# Patient Record
Sex: Female | Born: 1995
Health system: Southern US, Community
[De-identification: ages and names within clinical notes are randomized; demographics above are authoritative.]

## PROBLEM LIST (undated history)

## (undated) DIAGNOSIS — Z1379 Encounter for other screening for genetic and chromosomal anomalies: Principal | ICD-10-CM

## (undated) DIAGNOSIS — C50919 Malignant neoplasm of unspecified site of unspecified female breast: Secondary | ICD-10-CM

## (undated) DIAGNOSIS — Z923 Personal history of irradiation: Secondary | ICD-10-CM

## (undated) DIAGNOSIS — Z809 Family history of malignant neoplasm, unspecified: Secondary | ICD-10-CM

## (undated) HISTORY — DX: Family history of malignant neoplasm, unspecified: Z80.9

## (undated) HISTORY — DX: Encounter for other screening for genetic and chromosomal anomalies: Z13.79

## (undated) HISTORY — DX: Malignant neoplasm of unspecified site of unspecified female breast: C50.919

---

## 2017-03-31 ENCOUNTER — Ambulatory Visit (HOSPITAL_COMMUNITY)
Admission: EM | Admit: 2017-03-31 | Discharge: 2017-03-31 | Disposition: A | Discharge status: Home / Self Care | Payer: Medicaid Other | Attending: Family Medicine | Admitting: Family Medicine

## 2017-03-31 ENCOUNTER — Encounter (HOSPITAL_COMMUNITY): Payer: Self-pay | Admitting: Emergency Medicine

## 2017-03-31 DIAGNOSIS — N6323 Unspecified lump in the left breast, lower outer quadrant: Secondary | ICD-10-CM

## 2017-03-31 DIAGNOSIS — N63 Unspecified lump in unspecified breast: Secondary | ICD-10-CM

## 2017-03-31 MED ORDER — IBUPROFEN 200 MG PO TABS: 400.0000 mg | ORAL_TABLET | Freq: Four times a day (QID) | ORAL | 0 refills | Status: DC | PRN

## 2017-03-31 NOTE — Discharge Instructions (Signed)
You should be called by the Breast Center to set up time for imaging of this lump.  May take ibuprofen as needed for headache or arm pain.  If you do not hear from them by 12/17 please call this clinic or the breast center to set this up.  Please establish with a primary care provider for further follow up as needed.

## 2017-03-31 NOTE — ED Provider Notes (Signed)
[facility]    CSN: [id number] Arrival date & time: 03/31/17  1427     History   Chief Complaint Chief Complaint  Patient presents with  . Abscess    HPI Carrie Miller is a 21 y.o. female.   [name] presents with friend and family with complaints of left breast mass which she noticed approximately 2-3 weeks ago and seems to be increasing in pain. It is tender. She at times feels pain to her left arm as well, keeps her up at night at times. She has had intermittent headaches as well. Has taken ibuprofen which has helped. Has not taken today. Pain is 10/10 with palpation. Without redness or drainage. Denies previous similar. Without breast or nipple discharge. Without fevers. Denies medical history, does not take any medications regularly.   ROS per HPI.       History reviewed. No pertinent past medical history.  There are no active problems to display for this patient.   History reviewed. No pertinent surgical history.  OB History    No data available       Home Medications    Prior to Admission medications   Medication Sig Start Date End Date Taking? Authorizing Provider  ibuprofen (ADVIL,MOTRIN) 200 MG tablet Take 2 tablets (400 mg total) by mouth every 6 (six) hours as needed. 03/31/17   Zigmund Gottron, NP    Family History No family history on file.  Social History Social History   Tobacco Use  . Smoking status: Not on file  Substance Use Topics  . Alcohol use: Not on file  . Drug use: Not on file     Allergies   Patient has no known allergies.   Review of Systems Review of Systems   Physical Exam Triage Vital Signs ED Triage Vitals  Enc Vitals Group     BP 03/31/17 1526 120/81     Pulse Rate 03/31/17 1526 71     Resp 03/31/17 1526 16     Temp 03/31/17 1526 98.2 F (36.8 C)     Temp Source 03/31/17 1526 Oral     SpO2 03/31/17 1526 100 %     Weight 03/31/17 1527 160 lb (72.6 kg)     Height --      Head  Circumference --      Peak Flow --      Pain Score --      Pain Loc --      Pain Edu? --      Excl. in [place]? --    No data found.  Updated Vital Signs BP 120/81   Pulse 71   Temp 98.2 F (36.8 C) (Oral)   Resp 16   Wt 160 lb (72.6 kg)   SpO2 100%   Visual Acuity Right Eye Distance:   Left Eye Distance:   Bilateral Distance:    Right Eye Near:   Left Eye Near:    Bilateral Near:     Physical Exam  Constitutional: She is oriented to person, place, and time. She appears well-developed and well-nourished. No distress.  Cardiovascular: Normal rate, regular rhythm and normal heart sounds.  Pulmonary/Chest: Effort normal and breath sounds normal. Left breast exhibits mass and tenderness. Left breast exhibits no nipple discharge and no skin change.  Firm mass to left breast as indicated to graphic, approximately 1.5 inch in diameter; without redness, skin changes or apparent abscess present    Musculoskeletal: Normal range of motion.  Neurological: She is  alert and oriented to person, place, and time. No cranial nerve deficit.  Skin: Skin is warm and dry.  Vitals reviewed.    UC Treatments / Results  Labs (all labs ordered are listed, but only abnormal results are displayed) Labs Reviewed - No data to display  EKG  EKG Interpretation None       Radiology No results found.  Procedures Procedures (including critical care time)  Medications Ordered in UC Medications - No data to display   Initial Impression / Assessment and Plan / UC Course  I have reviewed the triage vital signs and the nursing notes.  Pertinent labs & imaging results that were available during my care of the patient were reviewed by me and considered in my medical decision making (see chart for details).     Order written for diagnostic mammography with breast center for further evaluation and treatment. May continue to use ibuprofen as needed for headaches or arm pain. Patient and family  verbalized understanding and agreeable to plan.    Final Clinical Impressions(s) / UC Diagnoses   Final diagnoses:  Breast mass    ED Discharge Orders        Ordered    ibuprofen (ADVIL,MOTRIN) 200 MG tablet  Every 6 hours PRN     03/31/17 1625       Controlled Substance Prescriptions [place] Controlled Substance Registry consulted? Not Applicable   Zigmund Gottron, NP 03/31/17 1636    Zigmund Gottron, NP 03/31/17 [phone]

## 2017-03-31 NOTE — ED Triage Notes (Signed)
PT has a bump / abscess under left breast for 4-5 days. PT has been having menstruals 2 times per month.

## 2017-04-03 ENCOUNTER — Other Ambulatory Visit: Payer: Self-pay | Admitting: Emergency Medicine

## 2017-04-03 DIAGNOSIS — N6320 Unspecified lump in the left breast, unspecified quadrant: Secondary | ICD-10-CM

## 2017-04-03 DIAGNOSIS — N644 Mastodynia: Secondary | ICD-10-CM

## 2017-04-12 ENCOUNTER — Other Ambulatory Visit: Payer: Self-pay

## 2017-04-15 ENCOUNTER — Ambulatory Visit
Admission: RE | Admit: 2017-04-15 | Discharge: 2017-04-15 | Disposition: A | Discharge status: Home / Self Care | Payer: No Typology Code available for payment source | Source: Ambulatory Visit | Attending: Obstetrics and Gynecology | Admitting: Obstetrics and Gynecology

## 2017-04-15 ENCOUNTER — Other Ambulatory Visit: Payer: Self-pay | Admitting: Obstetrics and Gynecology

## 2017-04-15 ENCOUNTER — Ambulatory Visit
Admission: RE | Admit: 2017-04-15 | Discharge: 2017-04-15 | Disposition: A | Discharge status: Home / Self Care | Payer: No Typology Code available for payment source | Source: Ambulatory Visit | Attending: Emergency Medicine | Admitting: Emergency Medicine

## 2017-04-15 ENCOUNTER — Other Ambulatory Visit: Payer: Self-pay | Admitting: Emergency Medicine

## 2017-04-15 ENCOUNTER — Encounter (HOSPITAL_COMMUNITY): Payer: Self-pay

## 2017-04-15 ENCOUNTER — Ambulatory Visit (HOSPITAL_COMMUNITY)
Admission: RE | Admit: 2017-04-15 | Discharge: 2017-04-15 | Disposition: A | Discharge status: Home / Self Care | Payer: Self-pay | Source: Ambulatory Visit | Attending: Obstetrics and Gynecology | Admitting: Obstetrics and Gynecology

## 2017-04-15 VITALS — BP 128/80 | Temp 97.8°F | Wt 201.8 lb

## 2017-04-15 DIAGNOSIS — N644 Mastodynia: Secondary | ICD-10-CM

## 2017-04-15 DIAGNOSIS — N6323 Unspecified lump in the left breast, lower outer quadrant: Secondary | ICD-10-CM

## 2017-04-15 DIAGNOSIS — N6320 Unspecified lump in the left breast, unspecified quadrant: Secondary | ICD-10-CM

## 2017-04-15 DIAGNOSIS — R599 Enlarged lymph nodes, unspecified: Secondary | ICD-10-CM

## 2017-04-15 DIAGNOSIS — Z01419 Encounter for gynecological examination (general) (routine) without abnormal findings: Secondary | ICD-10-CM

## 2017-04-15 DIAGNOSIS — N6321 Unspecified lump in the left breast, upper outer quadrant: Secondary | ICD-10-CM

## 2017-04-15 NOTE — Patient Instructions (Signed)
Explained breast self awareness with [facility]. Let patient know [facility] will cover Pap smears every 3 years unless has a history of abnormal Pap smears. Referred patient to the [facility] for a diagnostic mammogram. Appointment scheduled for Thursday, April 15, 2017 following [facility] appointment. Let patient know will follow up with her within the next couple weeks with results of Pap smear by phone. Carrie Miller verbalized understanding.  Carrie Miller, Arvil Chaco, RN 8:48 AM

## 2017-04-15 NOTE — Progress Notes (Signed)
Complaints of left breast lump x 4-5 weeks that has increased in size. Patient complained of left breast pain that comes goes and goes. Patient rates the pain at a 8 out of 10.  Pap Smear: Pap smear completed today. Per patient has never had a Pap smear completed. No Pap smear results are in Epic.  Physical exam: BreastsP Breasts symmetrical. No skin abnormalities bilateral breasts. No nipple retraction bilateral breasts. No nipple discharge bilateral breasts. No lymphadenopathy. No lumps palpated right breast. Palpated a left breast mass within the left lower outer quadrant and a left breast lump at 2 o'clock 11 cm from the nipple. Complaints of tenderness when palpated left breast mass. Referred patient to the [facility] for a diagnostic mammogram. Appointment scheduled for Thursday, April 15, 2017 following [facility] appointment.  Pelvic/Bimanual   Ext Genitalia No lesions, no swelling and no discharge observed on external genitalia.         Vagina Vagina pink and normal texture. No lesions or discharge observed in vagina.          Cervix Cervix is present. Cervix pink and of normal texture. No discharge observed.     Uterus Uterus is present and palpable. Uterus in normal position and normal size.        Adnexae Bilateral ovaries present and palpable. No tenderness on palpation.          Rectovaginal No rectal exam completed today since patient had no rectal complaints. No skin abnormalities observed on exam.    Smoking History: Patient has never smoked.  Patient Navigation: Patient education provided. Access to services provided for patient through [facility] program.

## 2017-04-16 ENCOUNTER — Encounter (HOSPITAL_COMMUNITY): Payer: Self-pay | Admitting: *Deleted

## 2017-04-16 ENCOUNTER — Telehealth (HOSPITAL_COMMUNITY): Payer: Self-pay | Admitting: Internal Medicine

## 2017-04-16 NOTE — Telephone Encounter (Signed)
L breast lesion(s) highly likely to be malignant on mammogram, scheduled for US/bx 12/31.  Referring to Oncology/Dr Lindi Adie.  LM

## 2017-04-19 ENCOUNTER — Ambulatory Visit
Admission: RE | Admit: 2017-04-19 | Discharge: 2017-04-19 | Disposition: A | Discharge status: Home / Self Care | Payer: No Typology Code available for payment source | Source: Ambulatory Visit | Attending: Obstetrics and Gynecology | Admitting: Obstetrics and Gynecology

## 2017-04-19 ENCOUNTER — Other Ambulatory Visit: Payer: Self-pay | Admitting: Obstetrics and Gynecology

## 2017-04-19 DIAGNOSIS — N644 Mastodynia: Secondary | ICD-10-CM

## 2017-04-19 DIAGNOSIS — R599 Enlarged lymph nodes, unspecified: Secondary | ICD-10-CM

## 2017-04-19 DIAGNOSIS — N6320 Unspecified lump in the left breast, unspecified quadrant: Secondary | ICD-10-CM

## 2017-04-20 LAB — CYTOLOGY - PAP: HPV: NOT DETECTED

## 2017-04-21 ENCOUNTER — Ambulatory Visit
Admission: RE | Admit: 2017-04-21 | Discharge: 2017-04-21 | Disposition: A | Discharge status: Home / Self Care | Payer: No Typology Code available for payment source | Source: Ambulatory Visit | Attending: Obstetrics and Gynecology | Admitting: Obstetrics and Gynecology

## 2017-04-21 ENCOUNTER — Telehealth: Payer: Self-pay | Admitting: Hematology

## 2017-04-21 DIAGNOSIS — N644 Mastodynia: Secondary | ICD-10-CM

## 2017-04-21 DIAGNOSIS — N6320 Unspecified lump in the left breast, unspecified quadrant: Secondary | ICD-10-CM

## 2017-04-21 NOTE — Telephone Encounter (Signed)
Spoke with patients sister in regards to upcoming appointment and gave her D/T/Loc/Ph#.  She stated that she did not need interp and her brother in law would be with her at appointment

## 2017-04-22 NOTE — Progress Notes (Signed)
Sent request to the clinics to schedule colpo so I can call patient with results and appointment. Thanks, Gabriel Cirri

## 2017-04-23 ENCOUNTER — Telehealth (HOSPITAL_COMMUNITY): Payer: Self-pay | Admitting: *Deleted

## 2017-04-23 NOTE — Telephone Encounter (Signed)
Telephoned patient at home left message to return call to [facility].

## 2017-04-26 ENCOUNTER — Telehealth (HOSPITAL_COMMUNITY): Payer: Self-pay | Admitting: *Deleted

## 2017-04-26 NOTE — Telephone Encounter (Signed)
Telephoned patient at home number and left message to return call to [facility] 

## 2017-04-27 ENCOUNTER — Other Ambulatory Visit: Payer: Self-pay | Admitting: General Surgery

## 2017-04-27 DIAGNOSIS — C50919 Malignant neoplasm of unspecified site of unspecified female breast: Secondary | ICD-10-CM

## 2017-04-27 DIAGNOSIS — C773 Secondary and unspecified malignant neoplasm of axilla and upper limb lymph nodes: Principal | ICD-10-CM

## 2017-04-28 ENCOUNTER — Encounter: Payer: Self-pay | Admitting: Nurse Practitioner

## 2017-04-28 ENCOUNTER — Other Ambulatory Visit: Payer: Self-pay | Admitting: *Deleted

## 2017-04-28 ENCOUNTER — Encounter: Payer: Self-pay | Admitting: General Practice

## 2017-04-28 ENCOUNTER — Telehealth: Payer: Self-pay | Admitting: Nurse Practitioner

## 2017-04-28 ENCOUNTER — Inpatient Hospital Stay: Payer: Medicaid Other | Attending: Nurse Practitioner | Admitting: Nurse Practitioner

## 2017-04-28 VITALS — BP 129/73 | HR 68 | Temp 97.9°F | Resp 18 | Wt 203.5 lb

## 2017-04-28 DIAGNOSIS — D493 Neoplasm of unspecified behavior of breast: Secondary | ICD-10-CM

## 2017-04-28 DIAGNOSIS — F419 Anxiety disorder, unspecified: Secondary | ICD-10-CM

## 2017-04-28 DIAGNOSIS — K769 Liver disease, unspecified: Secondary | ICD-10-CM | POA: Insufficient documentation

## 2017-04-28 DIAGNOSIS — Z171 Estrogen receptor negative status [ER-]: Secondary | ICD-10-CM | POA: Insufficient documentation

## 2017-04-28 DIAGNOSIS — R42 Dizziness and giddiness: Secondary | ICD-10-CM | POA: Insufficient documentation

## 2017-04-28 DIAGNOSIS — C773 Secondary and unspecified malignant neoplasm of axilla and upper limb lymph nodes: Secondary | ICD-10-CM | POA: Diagnosis not present

## 2017-04-28 DIAGNOSIS — Z8042 Family history of malignant neoplasm of prostate: Secondary | ICD-10-CM | POA: Insufficient documentation

## 2017-04-28 DIAGNOSIS — G4452 New daily persistent headache (NDPH): Secondary | ICD-10-CM | POA: Diagnosis not present

## 2017-04-28 DIAGNOSIS — Z5189 Encounter for other specified aftercare: Secondary | ICD-10-CM | POA: Insufficient documentation

## 2017-04-28 DIAGNOSIS — C50812 Malignant neoplasm of overlapping sites of left female breast: Secondary | ICD-10-CM | POA: Insufficient documentation

## 2017-04-28 DIAGNOSIS — Z808 Family history of malignant neoplasm of other organs or systems: Secondary | ICD-10-CM | POA: Insufficient documentation

## 2017-04-28 DIAGNOSIS — Z5111 Encounter for antineoplastic chemotherapy: Secondary | ICD-10-CM | POA: Insufficient documentation

## 2017-04-28 DIAGNOSIS — N939 Abnormal uterine and vaginal bleeding, unspecified: Secondary | ICD-10-CM | POA: Diagnosis not present

## 2017-04-28 MED ORDER — ALPRAZOLAM 0.25 MG PO TABS: 0.2500 mg | ORAL_TABLET | Freq: Every evening | ORAL | 0 refills | Status: DC | PRN

## 2017-04-28 NOTE — Telephone Encounter (Signed)
Gave avs and calendar for January and february °

## 2017-04-28 NOTE — Progress Notes (Signed)
Met with uninsured patient and family regarding assistance.  Discussed the [employer] and qualifications, patient has no income and lives at home. Letter of support requested and family verbalized understanding. Patient also going upstairs for [facility] Medicaid appointment. Advised them that she would automatically receive a 55%discount for being uninsured if Medicaid were not approved. Advised also that Rob in pharmacy will meet with them regarding any chemo drugs that are available for free through drug replacement. Will send Rob an email regarding this.  Scheduled appointment for 05/12/17 at 9am w/me. Appointment made in [place].

## 2017-04-28 NOTE — Progress Notes (Signed)
[facility] Clinical Social Work  Clinical Social Work was referred by Marine scientist.  Clinical Social Worker Edwyna Shell met w patient in office to assess psychosocial, emotional, mental health, and spiritual needs of the patient.  Patient's knowledge about cancer and its treatment including level of understanding, reactions, goals for care, and expectations:  New diagnosis of breast cancer, confirmed today. Prior to today, pt knew this was "possibiilty"; however, is now aware of reality of diagnosis.  Concerned about loss of hair, changes in skin, loss of fertility, side effects of chemotherapy.    Characteristics of the patient's support system:   Immigrated to  [place] from [place] w parents approx 9 months ago.  Lives w brother and sister in law.  Limited community support outside of family.  Friends in [place].  Parents are very "nervous" about patient's diagnosis, patient tries to shield them from becoming overwhelmed  Patient and family psychosocial functioning including strengths, limitations, and coping skills:  Supportive family, parents are not fully informed about nature of disease and treatment protocol, patient and brother/sister in law are worried that parents will not understand and will become very anxious on patient's behalf.  Patient often does not share her internal feelings w others, holds things inside.    Identifications of barriers to care:  Uninsured, is applying for [facility] as well as various financial grants available, has met w [employer] to complete initial paperwork.    Availability of community resources:  Attends house of worship locally.  Otherwise, has little outside support.  Was attending classes at community college w hopes of entering college and eventually studying to become a physician.  "I feel like my dreams are finished today."  Family will provide transport.  Lives w brother, sister in law and parents.    Clinical  Social Worker follow up needed: Yes.    If yes, follow up plan: CSW will follow patient while in treatment.  Patient and family given packet of information as well as CSW contact information.  Encouraged to call as needed.  Patient will need emotional support as well as available financial support resource information.    Edwyna Shell, LCSW Clinical Social Worker Phone:  [phone]

## 2017-04-28 NOTE — Progress Notes (Addendum)
[place]  [phone] Fax:([phone]) [place] Note   Patient Care Team: Patient, No Pcp Per as PCP - General ([place]) [name], MD as Consulting Physician (General Surgery) [name], MD as Consulting Physician (Hematology) [name], NP as Nurse Practitioner (Nurse Practitioner) 04/28/2017  CHIEF COMPLAINTS/PURPOSE OF CONSULTATION:  Left breast cancer, ER/PR Negative, HER2 Negative  ONCOLOGY HISTORY   Cancer of overlapping sites of left breast ([place])   04/15/2017 Mammogram    IMPRESSION: 1. Highly suspicious irregular mass within the left breast at the 4 o'clock axis, 8 cm from the nipple, measuring 5.2 cm, corresponding to the area of clinical concern. Ultrasound-guided biopsy is recommended. 2. Additional suspicious irregular mass within the left breast at the 2 o'clock axis, 8 cm from the nipple, measuring 1.6 cm, corresponding to the mammographic finding. Ultrasound-guided biopsy is recommended. 3. Additional suspicious mass within the left breast at the 2 o'clock axis, 10 cm from the nipple, axillary tail region, measuring 3 cm, suspected lymph node completely replaced by tumor. Ultrasound-guided biopsy is recommended. 4. No evidence of malignancy within the right breast.       04/15/2017 Breast US    Left breast: Targeted ultrasound is performed, showing an irregular mass in the left breast at the 4 o'clock axis, 8 cm from the nipple, measuring 5.2 x 4.3 x 4.3 cm, with internal vascularity, corresponding to the mammographic finding and palpable lump.  There is an additional irregular hypoechoic mass in the left breast at the 2 o'clock axis, 8 cm from the nipple, measuring 1.6 x 1.3 x 1.3 cm, with internal vascularity, corresponding to the additional mass seen within the outer left breast on mammogram, suspected satellite mass.  Lastly, there is an oval circumscribed hypoechoic mass in the  left breast at the 2 o'clock axis, 10 cm from the nipple, axillary tail region, with heterogeneous echotexture, measuring 3 x 2.3 x 2.8 cm, suspected lymph node completely replaced by tumor.  Remainder of the left axilla was evaluated with ultrasound showing no additional enlarged or morphologically abnormal lymph nodes.  IMPRESSION: 1. Highly suspicious irregular mass within the left breast at the 4 o'clock axis, 8 cm from the nipple, measuring 5.2 cm, corresponding to the area of clinical concern. Ultrasound-guided biopsy is recommended. 2. Additional suspicious irregular mass within the left breast at the 2 o'clock axis, 8 cm from the nipple, measuring 1.6 cm, corresponding to the mammographic finding. Ultrasound-guided biopsy is recommended. 3. Additional suspicious mass within the left breast at the 2 o'clock axis, 10 cm from the nipple, axillary tail region, measuring 3 cm, suspected lymph node completely replaced by tumor. Ultrasound-guided biopsy is recommended. 4. No evidence of malignancy within the right breast.       04/19/2017 Initial Biopsy    Diagnosis 1. Breast, left, needle core biopsy, 4:00 o'clock, ribbon clip - INVASIVE DUCTAL CARCINOMA. - SEE COMMENT. 2. Breast, left, needle core biopsy, 2:00 o'clock, coil clip - INVASIVE DUCTAL CARCINOMA. - SEE COMMENT. 3. Lymph node, needle/core biopsy, left axilla, spiral hydromark - DUCTAL CARCINOMA. - SEE COMMENT.  2. PROGNOSTIC INDICATORS Results: IMMUNOHISTOCHEMICAL AND MORPHOMETRIC ANALYSIS PERFORMED MANUALLY Estrogen Receptor: 0%, NEGATIVE Progesterone Receptor: 0%, NEGATIVE Proliferation Marker Ki67: 80%  2. FLUORESCENCE IN-SITU HYBRIDIZATION Results: HER2 - NEGATIVE RATIO OF HER2/CEP17 SIGNALS 1.53 AVERAGE HER2 COPY NUMBER PER CELL 2.75  Microscopic Comment 1. The carcinoma in the three specimens is morphologically similar and is grade III. Lymph nodal tissue is not definitively  identified in  specimen #3. A breast prognostic profile will be performed on part 2 and the results reported separately.      04/28/2017 Initial Diagnosis    Cancer of overlapping sites of left breast ([place])      HISTORY OF PRESENTING ILLNESS:  Carrie Miller 22 y.o. female is here because of newly diagnosed left breast cancer. She was referred by Dr. Barry Dienes. She presents with her brother and his wife; she speaks minimal [place], her family interpreted for her as she refused interpreter services. She initially palpated a left breast mass 6 weeks ago that was painful with breast swelling and felt to be enlarging. Has not had prior mammogram. Denies nipple inversion, discharge, or skin dimpling. She went to [facility] urgent care and was then referred to the breast center for imaging and diagnostics. Diagnostic mammogram showed a dominant mass in the left breast at the 4 o'clock axis, 8 cm from the nipple, measuring 5.2 x 4.3 x 4.3 cm; a satellite mass in the left breast at the 2 o'clock axis, 8 cm from the nipple, measuring 1.6 x 1.3 x 1.3 cm; and a circumscribed hypoechoic mass in the left breast at the 2 o'clock axis, 10 cm from the nipple, axillary tail measuring 3 x 2.3 x 2.8 cm, suspected lymph node completely replaced by tumor.  Ultrasound-guided biopsy of all 3 masses were positive for invasive ductal carcinoma, ER/PR negative, HER-2 negative, grade 3.    In addition she reports 1 month history of frequent daily headaches with associated dizziness and occasional left eye pain. Pain often wakes her up from sleep, has tried ibuprofen with some relief. Pain usually at the crown of her head, average 5/10 - 9/10 on pain scale. Not sensitive to light or sound. Denies fall. Reports fatigue for 1 week, denies weight loss, decreased appetite, abdominal pain. Over last 2 months she has irregular vaginal bleeding, bleeding 20 days out of the last month. Recently had abnormal PAP smear, colposcopy is pending. She feels very  anxious about her diagnosis.  She has no significant past medical history. Her father has prostate cancer diagnosed at age 46. Paternal aunt had "abdominal cancer." a paternal uncle also had prostate cancer diagnosed age 15. Negative family history of breast or GYN cancer. She lives with her family in a home with her parents, brother, and his brother's wife. She does not work or drive. She lived in [place] and moved to [place] 9 months ago. She has permanent resident card, no health insurance. Has been getting assistance with BCCCP program through [employer].  GYN HISTORY  Menarchal: 7 LMP: currently menstruating  Contraceptive: none HRT: none GP: G0  MEDICAL HISTORY:  Past Medical History:  Diagnosis Date  . Breast cancer ([place])     SURGICAL HISTORY: No past surgical history on file.  SOCIAL HISTORY: Social History   Socioeconomic History  . Marital status: Single    Spouse name: Not on file  . Number of children: Not on file  . Years of education: Not on file  . Highest education level: Not on file  Social Needs  . Financial resource strain: Not on file  . Food insecurity - worry: Not on file  . Food insecurity - inability: Not on file  . Transportation needs - medical: Not on file  . Transportation needs - non-medical: Not on file  Occupational History  . Occupation: not working   Tobacco Use  . Smoking status: Never Smoker  . Smokeless tobacco:  Never Used  Substance and Sexual Activity  . Alcohol use: No    Frequency: Never  . Drug use: No  . Sexual activity: No    Birth control/protection: None  Other Topics Concern  . Not on file  Social History Narrative  . Not on file    FAMILY HISTORY: Family History  Problem Relation Age of Onset  . Hyperlipidemia Mother   . Hypertension Mother   . Cancer Father 34       prostate  . Cancer Paternal Aunt 72       abdominal, type unknown   . Cancer Paternal Uncle 31       prostate    ALLERGIES:  has No  Known Allergies.  MEDICATIONS:  Current Outpatient Medications  Medication Sig Dispense Refill  . ALPRAZolam (XANAX) 0.25 MG tablet Take 1 tablet (0.25 mg total) by mouth at bedtime as needed for anxiety. 30 tablet 0  . ibuprofen (ADVIL,MOTRIN) 200 MG tablet Take 2 tablets (400 mg total) by mouth every 6 (six) hours as needed. 30 tablet 0   No current facility-administered medications for this visit.     REVIEW OF SYSTEMS:   Constitutional: Denies fevers, chills, abnormal night sweats, or decreased appetite (+) fatigue Eyes: Denies blurriness of vision, double vision or watery eyes (+) occasional left eye pain with daily headache Ears, nose, mouth, throat, and face: Denies mucositis or sore throat Respiratory: Denies cough, dyspnea or wheezes Cardiovascular: Denies palpitation, chest discomfort or lower extremity swelling Gastrointestinal:  Denies nausea, vomiting, constipation, diarrhea, heartburn or change in bowel habits GU/GYN: (+) Irregular vaginal bleeding, currently menstruating; (+) irregular bleeding every 5 days (+) has had vaginal bleeding 20 days out of last month (+) atypical cells on PAP 12/27, pending colposcopy  Skin: Denies abnormal skin rashes Lymphatics: Easy bruising (+) enlarged left axillary lymph node Neurological:Denies numbness, tingling or new weaknesses (+) daily headaches with associated dizziness Behavioral/Psych: (+) Anxiety (+) tearful All other systems were reviewed with the patient and are negative.  PHYSICAL EXAMINATION: ECOG PERFORMANCE STATUS: 1 - Symptomatic but completely ambulatory  Vitals:   04/28/17 1304  BP: 129/73  Pulse: 68  Resp: 18  Temp: 97.9 F (36.6 C)  SpO2: 100%   Filed Weights   04/28/17 1304  Weight: 203 lb 8 oz (92.3 kg)    GENERAL:alert, no distress and comfortable SKIN: skin color, texture, turgor are normal, no rashes or significant lesions EYES: normal, conjunctiva are pink and non-injected, sclera  clear OROPHARYNX:no exudate, no erythema and lips, buccal mucosa, and tongue normal  NECK: supple, thyroid normal size, non-tender, without nodularity LYMPH:  no palpable cervical, supraclavicular, or inguinal lymphadenopathy (+) left axillary adenopathy  LUNGS: clear to auscultation bilaterally with normal breathing effort HEART: regular rate & rhythm and no murmurs and no lower extremity edema ABDOMEN:abdomen soft, non-tender and normal bowel sounds.  No palpable hepatomegaly Musculoskeletal:no cyanosis of digits and no clubbing  PSYCH: alert & oriented x 3 with fluent speech NEURO: no focal motor/sensory deficits.  Cranial nerves II through XII intact BREASTS: No palpable mass or adenopathy in right breast or axilla that I could appreciate. (+) Palpable 7 x 3 cm mass in the lower outer quadrant of the left breast, tender (+) satellite mass in upper outer quadrant that is approximately 2.5 cm (+) palpable left lower axillary lymph node approximately 3 cm. The breast is swollen; measurements may be larger than corresponding imaging due to bleeding from biopsies.  LABORATORY DATA:  I have  reviewed the data as listed No flowsheet data found. Outside records with lab draw 04/26/17 and will be scanned into chart: WBC 6.4 ANC 3.7 RBC 4.34 Hgb 12.7 Platelet 318  Fasting blood glucose 91 Creatinine 0.7 Electrolytes WNL AST 17 ALT 16 Alk phos 71 T bili 0.4  RADIOGRAPHIC STUDIES: I have personally reviewed the radiological images as listed and agreed with the findings in the report. Mm Radiologist Eval And Mgmt  Result Date: 04/21/2017 EXAM: ESTABLISHED PATIENT OFFICE VISIT - LEVEL II CHIEF COMPLAINT: Patient returns to discuss biopsy results. She is accompanied by her brother and sister-in-law. Family members assisted with interpretation. HISTORY OF PRESENT ILLNESS: Status post ultrasound-guided core biopsy of mass in the left axilla, left breast 2 o'clock, and left breast 4 o'clock  locations. Since the biopsies, the patient has done well. She reports no bruising or bleeding. EXAM: Biopsy sites are clean and dry, per physical exam by Terie Purser, R.N. PATHOLOGY: 1. Breast, left, needle core biopsy, 4:00 o'clock, ribbon clip- INVASIVE DUCTAL CARCINOMA 2. Breast, left, needle core biopsy, 2:00 o'clock, coil clip- INVASIVE DUCTAL CARCINOMA 3. Lymph node, needle/core biopsy, left axilla, spiral HydroMARK- DUCTAL CARCINOMA lymph nodal tissue is not definitely identified in specimen 3. Each specimen is concordant with the imaging findings. ASSESSMENT AND PLAN: ASSESSMENT AND PLAN We discussed the pathology results. Questions were answered. A brief description of breast cancer treatment was discussed with the family. The patient is scheduled to see Dr. Barry Dienes for consultation on 04/26/2017. Electronically Signed   By: Nolon Nations M.D.   On: 04/21/2017 14:18   US Breast Ltd Uni Left Inc Axilla  Result Date: 04/15/2017 CLINICAL DATA:  22 year old female with a palpable mass in the left breast. This is patient's baseline mammogram. EXAM: 2D DIGITAL DIAGNOSTIC BILATERAL MAMMOGRAM WITH CAD AND ADJUNCT TOMO ULTRASOUND LEFT BREAST COMPARISON:  None. ACR Breast Density Category b: There are scattered areas of fibroglandular density. FINDINGS: Bilateral 2D CC and MLO views were obtained, with additional 3D tomosynthesis, and with additional spot compression view of the outer left breast corresponding to the area of clinical concern, with overlying skin marker in place. Left breast: There is a dense irregular mass within the outer left breast, measuring approximately 4.5 cm greatest dimension, corresponding to the area of clinical concern. There is an additional mass within the upper-outer quadrant of the left breast, with partially circumscribed and partially irregular margins, measuring approximately 1.8 cm greatest dimension, corresponding as an incidental finding. There is a partially imaged mass  within the left axilla. Right breast: There are no dominant masses, suspicious calcifications or secondary signs of malignancy within the right breast. Mammographic images were processed with CAD. Left breast: Targeted ultrasound is performed, showing an irregular mass in the left breast at the 4 o'clock axis, 8 cm from the nipple, measuring 5.2 x 4.3 x 4.3 cm, with internal vascularity, corresponding to the mammographic finding and palpable lump. There is an additional irregular hypoechoic mass in the left breast at the 2 o'clock axis, 8 cm from the nipple, measuring 1.6 x 1.3 x 1.3 cm, with internal vascularity, corresponding to the additional mass seen within the outer left breast on mammogram, suspected satellite mass. Lastly, there is an oval circumscribed hypoechoic mass in the left breast at the 2 o'clock axis, 10 cm from the nipple, axillary tail region, with heterogeneous echotexture, measuring 3 x 2.3 x 2.8 cm, suspected lymph node completely replaced by tumor. Remainder of the left axilla was evaluated with ultrasound showing  no additional enlarged or morphologically abnormal lymph nodes. IMPRESSION: 1. Highly suspicious irregular mass within the left breast at the 4 o'clock axis, 8 cm from the nipple, measuring 5.2 cm, corresponding to the area of clinical concern. Ultrasound-guided biopsy is recommended. 2. Additional suspicious irregular mass within the left breast at the 2 o'clock axis, 8 cm from the nipple, measuring 1.6 cm, corresponding to the mammographic finding. Ultrasound-guided biopsy is recommended. 3. Additional suspicious mass within the left breast at the 2 o'clock axis, 10 cm from the nipple, axillary tail region, measuring 3 cm, suspected lymph node completely replaced by tumor. Ultrasound-guided biopsy is recommended. 4. No evidence of malignancy within the right breast. RECOMMENDATION: 1. Ultrasound-guided biopsy of the highly suspicious mass within the left breast at the 4 o'clock  axis, measuring 5.2 cm. 2. Ultrasound-guided biopsy of the additional suspicious mass in the left breast at the 2 o'clock axis, measuring 1.6 cm. 3. Ultrasound-guided biopsy of the suspected abnormal lymph node in the left breast at the 2 o'clock axis, axillary tail region, measuring 3 cm. Ultrasound-guided biopsies are scheduled for December 31st. I have discussed the findings and recommendations with the patient. Results were also provided in writing at the conclusion of the visit. If applicable, a reminder letter will be sent to the patient regarding the next appointment. BI-RADS CATEGORY  5: Highly suggestive of malignancy. Electronically Signed   By: Franki Cabot M.D.   On: 04/15/2017 10:14   Mm Diag Breast Tomo Bilateral  Result Date: 04/15/2017 CLINICAL DATA:  22 year old female with a palpable mass in the left breast. This is patient's baseline mammogram. EXAM: 2D DIGITAL DIAGNOSTIC BILATERAL MAMMOGRAM WITH CAD AND ADJUNCT TOMO ULTRASOUND LEFT BREAST COMPARISON:  None. ACR Breast Density Category b: There are scattered areas of fibroglandular density. FINDINGS: Bilateral 2D CC and MLO views were obtained, with additional 3D tomosynthesis, and with additional spot compression view of the outer left breast corresponding to the area of clinical concern, with overlying skin marker in place. Left breast: There is a dense irregular mass within the outer left breast, measuring approximately 4.5 cm greatest dimension, corresponding to the area of clinical concern. There is an additional mass within the upper-outer quadrant of the left breast, with partially circumscribed and partially irregular margins, measuring approximately 1.8 cm greatest dimension, corresponding as an incidental finding. There is a partially imaged mass within the left axilla. Right breast: There are no dominant masses, suspicious calcifications or secondary signs of malignancy within the right breast. Mammographic images were processed  with CAD. Left breast: Targeted ultrasound is performed, showing an irregular mass in the left breast at the 4 o'clock axis, 8 cm from the nipple, measuring 5.2 x 4.3 x 4.3 cm, with internal vascularity, corresponding to the mammographic finding and palpable lump. There is an additional irregular hypoechoic mass in the left breast at the 2 o'clock axis, 8 cm from the nipple, measuring 1.6 x 1.3 x 1.3 cm, with internal vascularity, corresponding to the additional mass seen within the outer left breast on mammogram, suspected satellite mass. Lastly, there is an oval circumscribed hypoechoic mass in the left breast at the 2 o'clock axis, 10 cm from the nipple, axillary tail region, with heterogeneous echotexture, measuring 3 x 2.3 x 2.8 cm, suspected lymph node completely replaced by tumor. Remainder of the left axilla was evaluated with ultrasound showing no additional enlarged or morphologically abnormal lymph nodes. IMPRESSION: 1. Highly suspicious irregular mass within the left breast at the 4 o'clock axis,  8 cm from the nipple, measuring 5.2 cm, corresponding to the area of clinical concern. Ultrasound-guided biopsy is recommended. 2. Additional suspicious irregular mass within the left breast at the 2 o'clock axis, 8 cm from the nipple, measuring 1.6 cm, corresponding to the mammographic finding. Ultrasound-guided biopsy is recommended. 3. Additional suspicious mass within the left breast at the 2 o'clock axis, 10 cm from the nipple, axillary tail region, measuring 3 cm, suspected lymph node completely replaced by tumor. Ultrasound-guided biopsy is recommended. 4. No evidence of malignancy within the right breast. RECOMMENDATION: 1. Ultrasound-guided biopsy of the highly suspicious mass within the left breast at the 4 o'clock axis, measuring 5.2 cm. 2. Ultrasound-guided biopsy of the additional suspicious mass in the left breast at the 2 o'clock axis, measuring 1.6 cm. 3. Ultrasound-guided biopsy of the suspected  abnormal lymph node in the left breast at the 2 o'clock axis, axillary tail region, measuring 3 cm. Ultrasound-guided biopsies are scheduled for December 31st. I have discussed the findings and recommendations with the patient. Results were also provided in writing at the conclusion of the visit. If applicable, a reminder letter will be sent to the patient regarding the next appointment. BI-RADS CATEGORY  5: Highly suggestive of malignancy. Electronically Signed   By: Franki Cabot M.D.   On: 04/15/2017 10:14   [place] Axillary Node Core Biopsy Left  Result Date: 04/19/2017 CLINICAL DATA:  Patient presents for ultrasound-guided core biopsy of lower left axillary mass. Biopsies of other left breast masses are dictated separately. EXAM: [place] AXILLARY BIOPSY LEFT COMPARISON:  Previous exam(s). FINDINGS: I met with the patient and we discussed the procedure of ultrasound-guided biopsy, including benefits and alternatives. We discussed the high likelihood of a successful procedure. We discussed the risks of the procedure, including infection, bleeding, tissue injury, clip migration, and inadequate sampling. Informed written consent was given. The usual time-out protocol was performed immediately prior to the procedure. Using sterile technique and 1% Lidocaine as local anesthetic, under direct ultrasound visualization, a 14 gauge spring-loaded device was used to perform biopsy of mass in the lower left axilla using a lateral approach. At the conclusion of the procedure a spiral shaped HydroMARK tissue marker clip was deployed into the biopsy cavity. Follow up 2 view mammogram was performed and dictated separately. IMPRESSION: Ultrasound guided biopsy of left axillary mass. No apparent complications. Electronically Signed   By: Nolon Nations M.D.   On: 04/19/2017 16:08   Mm Clip Placement Left  Result Date: 04/19/2017 CLINICAL DATA:  Status post ultrasound-guided core biopsy of mass in the 4 o'clock location left  breast, 2 o'clock location left breast, and mass in the lower left axilla. EXAM: DIAGNOSTIC LEFT MAMMOGRAM POST ULTRASOUND BIOPSY x3 COMPARISON:  Previous exam(s). FINDINGS: Mammographic images were obtained following ultrasound guided biopsy of mass in the 4 o'clock location of the left breast with ribbon shaped clip deployment. Ribbon shaped clip is identified in a mass in the lower outer quadrant as expected. Following biopsy of mass in the 2 o'clock location, a coil shaped clip was deployed. Coil shaped clip is identified within the mass in the upper-outer quadrant. Spiral shaped clip is identified within the lower left axillary mass following biopsy of this mass. IMPRESSION: Tissue marker clips are in the expected locations after biopsy. Final Assessment: Post Procedure Mammograms for Marker Placement Electronically Signed   By: Nolon Nations M.D.   On: 04/19/2017 16:10   [place] Lt Breast Bx W Loc Dev 1st Lesion Img Bx Spec  US Guide  Result Date: 04/19/2017 CLINICAL DATA:  Patient presents for ultrasound-guided core biopsy of mass in the 4 o'clock location of the left breast. Biopsies of other left breast masses dictated separately. EXAM: ULTRASOUND GUIDED LEFT BREAST CORE NEEDLE BIOPSY COMPARISON:  Previous exam(s). FINDINGS: I met with the patient and we discussed the procedure of ultrasound-guided biopsy, including benefits and alternatives. We discussed the high likelihood of a successful procedure. We discussed the risks of the procedure, including infection, bleeding, tissue injury, clip migration, and inadequate sampling. Informed written consent was given. The usual time-out protocol was performed immediately prior to the procedure. Lesion quadrant: Lower outer quadrant Using sterile technique and 1% Lidocaine as local anesthetic, under direct ultrasound visualization, a 12 gauge spring-loaded device was used to perform biopsy of mass in the 4 o'clock location of the left breast using a lateral  approach. At the conclusion of the procedure a ribbon shaped tissue marker clip was deployed into the biopsy cavity. Follow up 2 view mammogram was performed and dictated separately. IMPRESSION: Ultrasound guided biopsy of left breast mass. No apparent complications. Electronically Signed   By: Nolon Nations M.D.   On: 04/19/2017 16:04   [place] Lt Breast Bx W Loc Dev Ea Add Lesion Img Bx Spec US Guide  Result Date: 04/19/2017 CLINICAL DATA:  Patient presents for ultrasound-guided core biopsy of mass in the 2 o'clock location of the left breast. Biopsies of other left breast masses are dictated separately. EXAM: ULTRASOUND GUIDED LEFT BREAST CORE NEEDLE BIOPSY COMPARISON:  Previous exam(s). FINDINGS: I met with the patient and we discussed the procedure of ultrasound-guided biopsy, including benefits and alternatives. We discussed the high likelihood of a successful procedure. We discussed the risks of the procedure, including infection, bleeding, tissue injury, clip migration, and inadequate sampling. Informed written consent was given. The usual time-out protocol was performed immediately prior to the procedure. Lesion quadrant: Upper outer quadrant Using sterile technique and 1% Lidocaine as local anesthetic, under direct ultrasound visualization, a 12 gauge spring-loaded device was used to perform biopsy of mass and 2 o'clock location of the left breast 6 using a lateral approach. At the conclusion of the procedure a coil shaped tissue marker clip was deployed into the biopsy cavity. Follow up 2 view mammogram was performed and dictated separately. IMPRESSION: Ultrasound guided biopsy of mass in the 2 o'clock location of the left breast. No apparent complications. Electronically Signed   By: Nolon Nations M.D.   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  -She presented with a palpable left breast mass, measures 5.2 cm on ultrasound, with positive lymph node. Breast tumor biopsy showed triple negative breast ductal carcinoma. -She was seen by Dr. Barry Dienes on 1/7 who feels she is potentially a candidate for breast conserving surgery but more likely will need at least left mastectomy if not bilateral mastectomies due to her young age, tumor size, and risk of recurrence.   -Due to her young age and triple negative disease she was previously referred to genetics  to rule out gene mutation that would predispose her to cancer, she is scheduled on 04/29/17. -We discussed the natural history of triple negative breast cancer, which is more aggressive, and the high-risk of recurrence after surgical resection.   -We discussed the benefit of neoadjuvant chemotherapy to shrink her tumor prior to surgery, both Dr. Barry Dienes and Dr. Burr Medico recommend neoadjuvant chemotherapy; she agrees to proceed. -Given her clinical stage IIIC, triple negative disease, Dr. Burr Medico recommends neoadjuvant regimen consisting of AC every 2 weeks for 4 cycles+ carboplatin/Taxol weekly for 12 cycles, then surgery then radiation.   --Chemotherapy consent: Side effects including but does not not limited to, fatigue, nausea, vomiting, diarrhea, hair loss, neuropathy, fluid retention, renal and kidney dysfunction, neutropenic fever, needed for blood transfusion, bleeding, heart failure, small risk of leukemia or MDS, were discussed with patient in great detail. She agreed to proceed. -She had CBC and Cmet at Dr. Marlowe Aschoff office this week, all were WNL -Will  obtain pre-treatment breast MRI -CT CAP and whole body bone scan will be obtained to complete staging work up -Due to her frequent headaches and dizziness, will obtain brain MRI to rule out brain metastasis   -She will have PAC placed by IR, chemo class and ECHO before starting chemotherapy.   -She desires fertility preservation, will refer her for urgent appointment at [facility] prior to beginning chemotherapy.  -We'll plan to start her chemotherapy in 2 weeks.  2.  Headaches, dizziness -She has 1 month history of daily headaches with associated dizziness, pain ranges from 5/10 - 9/10 on the crown of her head, ibuprofen helps some; she can continue this or take tylenol PRN, neuro exam was unremarkable -Given the aggressive nature of triple negative disease, will obtain brain MRI to rule out brain metastasis  3.  Anxiety -She has considerable anxiety about her new diagnosis and the pending work up and treatment plan, often tearful during today's exam. -I prescribed low dose xanax for her to take PRN for anxiety especially at night if she has difficulty sleeping  4.  Social issues -She lives with her family and has their support; does not work or drive -She does not currently have Scientist, product/process development, has been using Counselling psychologist -We have included breast Engineer, site, social work, Clinical biochemist, and financial advocate to provide more assistance for her throughout this process  5.  Genetics -Due to her young age and family history of prostate cancer, she has been referred to genetics, apt on 1/10  6. Fertility planning  -We discussed the risk of infertility secondary to chemotherapy, she wishes to preserve fertility  -I have referred her to [facility] fertility specialist, initial appointment on 1/11 to hopefully complete process prior to first chemo  7. Irregular vaginal bleeding -She has 2 month history of irregular vaginal bleeding, she reports she'll have bleeding every 5  days; has been bleeding for 20 days of the last month -04/15/18 PAP with atypical squamous cells, colposcopy planned on 1/21  PLAN -chemo class, echo, CT CAP, bone scan, breast MRI, brain MRI in 1 week -PAC per IR prior to chemotherapy -Fertility specialist 1/11; subsequent appointments pending -return for lab, f/u with Velta Rockholt, and cycle 1 AC in approx 2 weeks  -Continue NSAIDs and Tylenol PRN for headache -Prescription for xanax  Orders Placed This Encounter  Procedures  . MR Brain W Wo Contrast    Standing Status:   Future    Standing Expiration Date:   04/28/2018    Order Specific  Question:   If indicated for the ordered procedure, I authorize the administration of contrast media per Radiology protocol    Answer:   Yes    Order Specific Question:   What is the patient's sedation requirement?    Answer:   No Sedation    Order Specific Question:   Does the patient have a pacemaker or implanted devices?    Answer:   No    Order Specific Question:   Radiology Contrast Protocol - do NOT remove file path    Answer:   [email or web address]    Order Specific Question:   Reason for Exam additional comments    Answer:   patient with 1 month history of daily headache and dizziness    Order Specific Question:   Preferred imaging location?    Answer:   [facility] (table limit-350 lbs)    Order Specific Question:   Is patient pregnant?    Answer:   No  . CT Abdomen Pelvis W Contrast    Standing Status:   Future    Standing Expiration Date:   04/28/2018    Order Specific Question:   If indicated for the ordered procedure, I authorize the administration of contrast media per Radiology protocol    Answer:   Yes    Order Specific Question:   Is patient pregnant?    Answer:   No    Order Specific Question:   Preferred imaging location?    Answer:   [facility]    Order Specific Question:   Radiology Contrast Protocol - do NOT remove file path     Answer:   [email or web address]    Order Specific Question:   Reason for Exam additional comments    Answer:   triple negative breast cancer  . CT Chest W Contrast    Standing Status:   Future    Standing Expiration Date:   04/28/2018    Order Specific Question:   If indicated for the ordered procedure, I authorize the administration of contrast media per Radiology protocol    Answer:   Yes    Order Specific Question:   Is patient pregnant?    Answer:   No    Order Specific Question:   Preferred imaging location?    Answer:   [facility]    Order Specific Question:   Radiology Contrast Protocol - do NOT remove file path    Answer:   [email or web address]    Order Specific Question:   Reason for Exam additional comments    Answer:   triple negative breast cancer  . NM Bone Scan Whole Body    Standing Status:   Future    Standing Expiration Date:   04/28/2018    Order Specific Question:   If indicated for the ordered procedure, I authorize the administration of a radiopharmaceutical per Radiology protocol    Answer:   Yes    Order Specific Question:   Is the patient pregnant?    Answer:   No    Order Specific Question:   Preferred imaging location?    Answer:   [facility]    Order Specific Question:   Radiology Contrast Protocol - do NOT remove file path    Answer:   [email or web address]    Order Specific Question:   Reason for Exam additional comments    Answer:   triple negative breast cancer    Order Specific Question:   Is patient pregnant?  Answer:   No  . ECHOCARDIOGRAM COMPLETE    Baseline echo before adriamycin therapy for breast cancer    Standing Status:   Future    Standing Expiration Date:   07/28/2018    Order Specific Question:   Where should this test be performed    Answer:   [place]    Order Specific Question:   Perflutren DEFINITY (image enhancing agent) should  be administered unless hypersensitivity or allergy exist    Answer:   Administer Perflutren    Order Specific Question:   Expected Date:    Answer:   1 week       [name], NP 04/28/2017 3:48 PM  Addendum  I have seen the patient, examined her. I agree with the assessment and and plan and have edited the notes.   Carrie Miller is a 22 yo woman, oriented from [place].  She recently came to the [place] and lives with her brother and sister-in-law in [place].  No family history of breast cancer.  She presented with a large palpable breast mass.  I have reviewed her imaging and biopsy findings.  I am, she has a 7cm mass in outer lower quadrant, a 2.5 cm mass in upper outer quadrant, and a 3 cm node in the left axilla.  All 3 biopsy showed triple negative high-grade ductal carcinoma.  I will get staging CT scan and bone scan to rule out distant metastasis. Due to her new headaches, I will get a brain MRI w wo contrast to rule out metastasis also.   Discussed the negative disease, and the high risk of recurrence after surgery.  Recommend neoadjuvant chemotherapy with dose dense Adriamycin and Cytoxan every 2 weeks for 4 cycles, followed by weekly carboplatin and Taxol for 12 weeks.  Potential side effects, including infertility, discussed with patient, she agrees to proceed.  Will make an urgent referral to fertility clinic today. Will arrange port placement, ECHO, breast MRI, and chemo class.   Pt is overwhelmed by the diagnosis and treatment plan.  She met our breast cancer navigator, social worker and Merchant navy officer after her visit with [place].   Plan to start chemo ASAP when we know her fertility preservation plan. Will given monthly gosertrilin injectiosn during her chemotherapy.   All questions were answered. The patient knows to call the clinic with any problems, questions or concerns. I spent 40 minutes counseling the patient face to face. The total time spent in the appointment was 60  minutes and more than 50% was on counseling.   [name]  04/28/2017

## 2017-04-29 ENCOUNTER — Inpatient Hospital Stay: Payer: Medicaid Other

## 2017-04-29 ENCOUNTER — Encounter: Payer: Self-pay | Admitting: Genetic Counselor

## 2017-04-29 ENCOUNTER — Telehealth: Payer: Self-pay | Admitting: *Deleted

## 2017-04-29 ENCOUNTER — Inpatient Hospital Stay: Payer: Medicaid Other | Admitting: Genetic Counselor

## 2017-04-29 ENCOUNTER — Telehealth: Payer: Self-pay | Admitting: Nurse Practitioner

## 2017-04-29 DIAGNOSIS — Z809 Family history of malignant neoplasm, unspecified: Secondary | ICD-10-CM | POA: Insufficient documentation

## 2017-04-29 DIAGNOSIS — Z1379 Encounter for other screening for genetic and chromosomal anomalies: Secondary | ICD-10-CM

## 2017-04-29 HISTORY — DX: Encounter for other screening for genetic and chromosomal anomalies: Z13.79

## 2017-04-29 MED ORDER — LIDOCAINE-PRILOCAINE 2.5-2.5 % EX CREA: TOPICAL_CREAM | CUTANEOUS | 3 refills | Status: DC

## 2017-04-29 MED ORDER — PROCHLORPERAZINE MALEATE 10 MG PO TABS: 10.0000 mg | ORAL_TABLET | Freq: Four times a day (QID) | ORAL | 1 refills | Status: DC | PRN

## 2017-04-29 MED ORDER — ONDANSETRON HCL 8 MG PO TABS: 8.0000 mg | ORAL_TABLET | Freq: Two times a day (BID) | ORAL | 1 refills | Status: DC | PRN

## 2017-04-29 NOTE — Telephone Encounter (Signed)
I spoke with Carrie Miller to inform her of fertility specialist apt on 1/11 at 3 pm. I have her their phone number to call to obtain driving instructions etc. I informed her of the costs [place] [facility] [place] quoted me. She verbalized understanding. I also informed her that per Dr. Burr Medico she plans to give Zoladex injection during chemo to suppress ovaries during chemotherapy. She will inform [name]. No other questions or concerns at this time. We reviewed here schedule of apts on 1/11 and she is aware. Thanks for the call.

## 2017-04-29 NOTE — Progress Notes (Signed)
[facility]      Initial Visit   Patient Name: Carrie Miller Patient DOB: August 08, 1995 Patient Age: 22 y.o. Encounter Date: 04/29/2017  Referring Provider: [name], MD   Reason for Visit: Evaluate for hereditary susceptibility to cancer    Assessment and Plan:  . Carrie Miller's history of breast cancer at age 64 is very concerning even though her family history is not highly suggestive of a hereditary predisposition to cancer. This may be due to a BRCA1 or BRCA2 mutation or possibly a de novo TP53 mutation.  . Testing is recommended to determine whether she has a pathogenic mutation that will impact her screening and risk-reduction for cancer. A negative result will be generally reassuring, but family members need to discuss appropriate screenings with their physicians.  . Carrie Miller wished to pursue genetic testing and a blood sample will be sent for analysis of the 47 genes on [facility]'s Common Cancers panel (APC, ATM, AXIN2, BARD1, BMPR1A, BRCA1, BRCA2, BRIP1, CDH1, CDK4, CDKN2A, CHEK2, CTNNA1, DICER1, EPCAM, GREM1, HOXB13, KIT, MEN1, MLH1, MSH2, MSH3, MSH6, MUTYH, NBN, NF1, NTHL1, PALB2, PDGFRA, PMS2, POLD1, POLE, PTEN, RAD50, RAD51C, RAD51D, SDHA, SDHB, SDHC, SDHD, SMAD4, SMARCA4, STK11, TP53, TSC1, TSC2, VHL).   . Results should be available in approximately 2-4 weeks, at which point we will contact her and address implications for her as well as address genetic testing for at-risk family members, if needed.     Dr. Burr Medico was available for questions concerning this case. Total time spent by me in face-to-face counseling was approximately 35 minutes.   _____________________________________________________________________   History of Present Illness: Ms. Carrie Miller, a 22 y.o. female, is being seen at the [facility] due to a personal and family history of cancer. She presents to clinic today with her brother,  Otho Ket, to discuss the possibility of a hereditary predisposition to cancer and discuss whether genetic testing is warranted. They were offered interpreter services, but declined.  Carrie Miller was diagnosed with breast cancer at the age of 6. She will be starting neoadjuvant chemotherapy soon.  The breast tumor was ER negative, PR negative, and HER2 negative.    Cancer of overlapping sites of left breast ([place])   04/15/2017 Mammogram    IMPRESSION: 1. Highly suspicious irregular mass within the left breast at the 4 o'clock axis, 8 cm from the nipple, measuring 5.2 cm, corresponding to the area of clinical concern. Ultrasound-guided biopsy is recommended. 2. Additional suspicious irregular mass within the left breast at the 2 o'clock axis, 8 cm from the nipple, measuring 1.6 cm, corresponding to the mammographic finding. Ultrasound-guided biopsy is recommended. 3. Additional suspicious mass within the left breast at the 2 o'clock axis, 10 cm from the nipple, axillary tail region, measuring 3 cm, suspected lymph node completely replaced by tumor. Ultrasound-guided biopsy is recommended. 4. No evidence of malignancy within the right breast.       04/15/2017 Breast US    Left breast: Targeted ultrasound is performed, showing an irregular mass in the left breast at the 4 o'clock axis, 8 cm from the nipple, measuring 5.2 x 4.3 x 4.3 cm, with internal vascularity, corresponding to the mammographic finding and palpable lump.  There is an additional irregular hypoechoic mass in the left breast at the 2 o'clock axis, 8 cm from the nipple, measuring 1.6 x 1.3 x 1.3 cm, with internal vascularity, corresponding to the additional mass seen within the outer  left breast on mammogram, suspected satellite mass.  Lastly, there is an oval circumscribed hypoechoic mass in the left breast at the 2 o'clock axis, 10 cm from the nipple, axillary tail region, with heterogeneous echotexture, measuring  3 x 2.3 x 2.8 cm, suspected lymph node completely replaced by tumor.  Remainder of the left axilla was evaluated with ultrasound showing no additional enlarged or morphologically abnormal lymph nodes.  IMPRESSION: 1. Highly suspicious irregular mass within the left breast at the 4 o'clock axis, 8 cm from the nipple, measuring 5.2 cm, corresponding to the area of clinical concern. Ultrasound-guided biopsy is recommended. 2. Additional suspicious irregular mass within the left breast at the 2 o'clock axis, 8 cm from the nipple, measuring 1.6 cm, corresponding to the mammographic finding. Ultrasound-guided biopsy is recommended. 3. Additional suspicious mass within the left breast at the 2 o'clock axis, 10 cm from the nipple, axillary tail region, measuring 3 cm, suspected lymph node completely replaced by tumor. Ultrasound-guided biopsy is recommended. 4. No evidence of malignancy within the right breast.       04/19/2017 Initial Biopsy    Diagnosis 1. Breast, left, needle core biopsy, 4:00 o'clock, ribbon clip - INVASIVE DUCTAL CARCINOMA. - SEE COMMENT. 2. Breast, left, needle core biopsy, 2:00 o'clock, coil clip - INVASIVE DUCTAL CARCINOMA. - SEE COMMENT. 3. Lymph node, needle/core biopsy, left axilla, spiral hydromark - DUCTAL CARCINOMA. - SEE COMMENT.  2. PROGNOSTIC INDICATORS Results: IMMUNOHISTOCHEMICAL AND MORPHOMETRIC ANALYSIS PERFORMED MANUALLY Estrogen Receptor: 0%, NEGATIVE Progesterone Receptor: 0%, NEGATIVE Proliferation Marker Ki67: 80%  2. FLUORESCENCE IN-SITU HYBRIDIZATION Results: HER2 - NEGATIVE RATIO OF HER2/CEP17 SIGNALS 1.53 AVERAGE HER2 COPY NUMBER PER CELL 2.75  Microscopic Comment 1. The carcinoma in the three specimens is morphologically similar and is grade III. Lymph nodal tissue is not definitively identified in specimen #3. A breast prognostic profile will be performed on part 2 and the results reported separately.      04/28/2017  Initial Diagnosis    Cancer of overlapping sites of left breast [facility])       Past Medical History:  Diagnosis Date  . Breast cancer ([place])   . Family history of cancer     Social History   Socioeconomic History  . Marital status: Single    Spouse name: Not on file  . Number of children: Not on file  . Years of education: Not on file  . Highest education level: Not on file  Social Needs  . Financial resource strain: Not on file  . Food insecurity - worry: Not on file  . Food insecurity - inability: Not on file  . Transportation needs - medical: Not on file  . Transportation needs - non-medical: Not on file  Occupational History  . Occupation: not working   Tobacco Use  . Smoking status: Never Smoker  . Smokeless tobacco: Never Used  Substance and Sexual Activity  . Alcohol use: No    Frequency: Never  . Drug use: No  . Sexual activity: No    Birth control/protection: None  Other Topics Concern  . Not on file  Social History Narrative  . Not on file     Family History:  During the visit, a 4-generation pedigree was obtained. Family tree will be scanned in the Media tab in Epic  Significant diagnoses include the following:  Family History  Problem Relation Age of Onset  . Hyperlipidemia Mother   . Hypertension Mother   . Prostate cancer Father 67  currently 70  . Stomach cancer Paternal Aunt 56       deceased 72s  . Prostate cancer Paternal Uncle 24       deceased 57    Additionally, Ms. Kreitzer has no children. She has a sister and 7 brothers. Her mother (age 37) has 44 sisters and 5 brothers. Her father (noted above) has 2 full brothers and 2 full sisters. He also has 7 paternal half-siblings. There are no cancers in any of her grandparents; all passed away in their 33s.  Ms. Fleissner family is from [place]. There is no known Jewish ancestry. There is consanguinity as her parents are distant cousins.  Discussion: We reviewed the  characteristics, features and inheritance patterns of hereditary cancer syndromes. We discussed her risk of harboring a mutation in the context of her personal and family history. We discussed the process of genetic testing, insurance coverage and implications of results: positive, negative and variant of unknown significance (VUS).    Ms. Lueth questions were answered to her satisfaction today and she is welcome to call with any additional questions or concerns. Thank you for the referral and allowing [place] to share in the care of your patient.    Steele Berg, MS, [place] Certified Genetic Counselor phone: [phone] [email or web address]

## 2017-04-29 NOTE — Telephone Encounter (Signed)
Sister understood need to reschedule chemo class to a one on one.

## 2017-04-30 ENCOUNTER — Ambulatory Visit (HOSPITAL_BASED_OUTPATIENT_CLINIC_OR_DEPARTMENT_OTHER)
Admission: RE | Admit: 2017-04-30 | Discharge: 2017-04-30 | Disposition: A | Discharge status: Home / Self Care | Payer: Medicaid Other | Source: Ambulatory Visit | Attending: Nurse Practitioner | Admitting: Nurse Practitioner

## 2017-04-30 ENCOUNTER — Other Ambulatory Visit: Payer: Self-pay | Admitting: General Surgery

## 2017-04-30 ENCOUNTER — Ambulatory Visit (HOSPITAL_COMMUNITY)
Admission: RE | Admit: 2017-04-30 | Discharge: 2017-04-30 | Disposition: A | Discharge status: Home / Self Care | Payer: Medicaid Other | Source: Ambulatory Visit | Attending: General Surgery | Admitting: General Surgery

## 2017-04-30 ENCOUNTER — Other Ambulatory Visit: Payer: Self-pay

## 2017-04-30 ENCOUNTER — Other Ambulatory Visit: Payer: Self-pay | Admitting: Nurse Practitioner

## 2017-04-30 ENCOUNTER — Encounter: Payer: Self-pay | Admitting: General Practice

## 2017-04-30 ENCOUNTER — Inpatient Hospital Stay: Payer: Medicaid Other

## 2017-04-30 VITALS — BP 134/85 | HR 81 | Temp 98.6°F | Resp 20

## 2017-04-30 DIAGNOSIS — C50812 Malignant neoplasm of overlapping sites of left female breast: Secondary | ICD-10-CM | POA: Insufficient documentation

## 2017-04-30 DIAGNOSIS — K769 Liver disease, unspecified: Secondary | ICD-10-CM | POA: Diagnosis not present

## 2017-04-30 DIAGNOSIS — C50919 Malignant neoplasm of unspecified site of unspecified female breast: Secondary | ICD-10-CM | POA: Diagnosis present

## 2017-04-30 DIAGNOSIS — Z5111 Encounter for antineoplastic chemotherapy: Secondary | ICD-10-CM | POA: Diagnosis not present

## 2017-04-30 DIAGNOSIS — Z171 Estrogen receptor negative status [ER-]: Secondary | ICD-10-CM | POA: Diagnosis not present

## 2017-04-30 DIAGNOSIS — C773 Secondary and unspecified malignant neoplasm of axilla and upper limb lymph nodes: Secondary | ICD-10-CM | POA: Diagnosis present

## 2017-04-30 DIAGNOSIS — Z5189 Encounter for other specified aftercare: Secondary | ICD-10-CM | POA: Diagnosis not present

## 2017-04-30 DIAGNOSIS — G4452 New daily persistent headache (NDPH): Secondary | ICD-10-CM | POA: Diagnosis not present

## 2017-04-30 DIAGNOSIS — Z808 Family history of malignant neoplasm of other organs or systems: Secondary | ICD-10-CM | POA: Diagnosis not present

## 2017-04-30 DIAGNOSIS — R42 Dizziness and giddiness: Secondary | ICD-10-CM | POA: Diagnosis not present

## 2017-04-30 DIAGNOSIS — Z8042 Family history of malignant neoplasm of prostate: Secondary | ICD-10-CM | POA: Diagnosis not present

## 2017-04-30 DIAGNOSIS — N939 Abnormal uterine and vaginal bleeding, unspecified: Secondary | ICD-10-CM | POA: Diagnosis not present

## 2017-04-30 MED ORDER — GADOBENATE DIMEGLUMINE 529 MG/ML IV SOLN
20.0000 mL | Freq: Once | INTRAVENOUS | Status: AC | PRN
Start: 2017-04-30 — End: 2017-04-30
  Administered 2017-04-30: 19 mL via INTRAVENOUS

## 2017-04-30 MED ORDER — GOSERELIN ACETATE 3.6 MG ~~LOC~~ IMPL
3.6000 mg | DRUG_IMPLANT | Freq: Once | SUBCUTANEOUS | Status: AC
  Administered 2017-04-30: 3.6 mg via SUBCUTANEOUS
  Filled 2017-04-30: qty 3.6

## 2017-04-30 NOTE — Progress Notes (Signed)
  Echocardiogram 2D Echocardiogram has been performed.  Darlina Sicilian M 04/30/2017, 9:45 AM

## 2017-04-30 NOTE — Progress Notes (Signed)
[facility] CSW Progress Note  Per Sharee Pimple, patient has been matched Writer guide for support.  Edwyna Shell, LCSW Clinical Social Worker Phone:  [phone]

## 2017-04-30 NOTE — Patient Instructions (Signed)
Goserelin injection What is this medicine? GOSERELIN (GOE se rel in) is similar to a hormone found in the body. It lowers the amount of sex hormones that the body makes. Men will have lower testosterone levels and women will have lower estrogen levels while taking this medicine. In men, this medicine is used to treat prostate cancer; the injection is either given once per month or once every 12 weeks. A once per month injection (only) is used to treat women with endometriosis, dysfunctional uterine bleeding, or advanced breast cancer. This medicine may be used for other purposes; ask your health care provider or pharmacist if you have questions. COMMON BRAND NAME(S): Zoladex What should I tell my health care provider before I take this medicine? They need to know if you have any of these conditions (some only apply to women): -diabetes -heart disease or previous heart attack -high blood pressure -high cholesterol -kidney disease -osteoporosis or low bone density -problems passing urine -spinal cord injury -stroke -tobacco smoker -an unusual or allergic reaction to goserelin, hormone therapy, other medicines, foods, dyes, or preservatives -pregnant or trying to get pregnant -breast-feeding How should I use this medicine? This medicine is for injection under the skin. It is given by a health care professional in a hospital or clinic setting. Men receive this injection once every 4 weeks or once every 12 weeks. Women will only receive the once every 4 weeks injection. Talk to your pediatrician regarding the use of this medicine in children. Special care may be needed. Overdosage: If you think you have taken too much of this medicine contact a poison control center or emergency room at once. NOTE: This medicine is only for you. Do not share this medicine with others. What if I miss a dose? It is important not to miss your dose. Call your doctor or health care professional if you are unable to  keep an appointment. What may interact with this medicine? -female hormones like estrogen -herbal or dietary supplements like black cohosh, chasteberry, or DHEA -female hormones like testosterone -prasterone This list may not describe all possible interactions. Give your health care provider a list of all the medicines, herbs, non-prescription drugs, or dietary supplements you use. Also tell them if you smoke, drink alcohol, or use illegal drugs. Some items may interact with your medicine. What should I watch for while using this medicine? Visit your doctor or health care professional for regular checks on your progress. Your symptoms may appear to get worse during the first weeks of this therapy. Tell your doctor or healthcare professional if your symptoms do not start to get better or if they get worse after this time. Your bones may get weaker if you take this medicine for a long time. If you smoke or frequently drink alcohol you may increase your risk of bone loss. A family history of osteoporosis, chronic use of drugs for seizures (convulsions), or corticosteroids can also increase your risk of bone loss. Talk to your doctor about how to keep your bones strong. This medicine should stop regular monthly menstration in women. Tell your doctor if you continue to menstrate. Women should not become pregnant while taking this medicine or for 12 weeks after stopping this medicine. Women should inform their doctor if they wish to become pregnant or think they might be pregnant. There is a potential for serious side effects to an unborn child. Talk to your health care professional or pharmacist for more information. Do not breast-feed an infant while taking   this medicine. Men should inform their doctors if they wish to father a child. This medicine may lower sperm counts. Talk to your health care professional or pharmacist for more information. What side effects may I notice from receiving this  medicine? Side effects that you should report to your doctor or health care professional as soon as possible: -allergic reactions like skin rash, itching or hives, swelling of the face, lips, or tongue -bone pain -breathing problems -changes in vision -chest pain -feeling faint or lightheaded, falls -fever, chills -pain, swelling, warmth in the leg -pain, tingling, numbness in the hands or feet -signs and symptoms of low blood pressure like dizziness; feeling faint or lightheaded, falls; unusually weak or tired -stomach pain -swelling of the ankles, feet, hands -trouble passing urine or change in the amount of urine -unusually high or low blood pressure -unusually weak or tired Side effects that usually do not require medical attention (report to your doctor or health care professional if they continue or are bothersome): -change in sex drive or performance -changes in breast size in both males and females -changes in emotions or moods -headache -hot flashes -irritation at site where injected -loss of appetite -skin problems like acne, dry skin -vaginal dryness This list may not describe all possible side effects. Call your doctor for medical advice about side effects. You may report side effects to FDA at 1-800-FDA-1088. Where should I keep my medicine? This drug is given in a hospital or clinic and will not be stored at home. NOTE: This sheet is a summary. It may not cover all possible information. If you have questions about this medicine, talk to your doctor, pharmacist, or health care provider.  2018 Elsevier/Gold Standard (2013-06-13 11:10:35)  

## 2017-05-03 ENCOUNTER — Other Ambulatory Visit: Payer: Self-pay | Admitting: Hematology

## 2017-05-03 ENCOUNTER — Other Ambulatory Visit: Payer: Self-pay

## 2017-05-03 ENCOUNTER — Ambulatory Visit (HOSPITAL_COMMUNITY)
Admission: RE | Admit: 2017-05-03 | Discharge: 2017-05-03 | Disposition: A | Discharge status: Home / Self Care | Payer: Medicaid Other | Source: Ambulatory Visit | Attending: Hematology | Admitting: Hematology

## 2017-05-03 ENCOUNTER — Encounter (HOSPITAL_COMMUNITY): Payer: Self-pay

## 2017-05-03 DIAGNOSIS — C50812 Malignant neoplasm of overlapping sites of left female breast: Secondary | ICD-10-CM

## 2017-05-03 DIAGNOSIS — Z171 Estrogen receptor negative status [ER-]: Secondary | ICD-10-CM

## 2017-05-03 DIAGNOSIS — C773 Secondary and unspecified malignant neoplasm of axilla and upper limb lymph nodes: Secondary | ICD-10-CM | POA: Insufficient documentation

## 2017-05-03 DIAGNOSIS — C50412 Malignant neoplasm of upper-outer quadrant of left female breast: Secondary | ICD-10-CM | POA: Diagnosis not present

## 2017-05-03 DIAGNOSIS — Z809 Family history of malignant neoplasm, unspecified: Secondary | ICD-10-CM | POA: Diagnosis not present

## 2017-05-03 HISTORY — PX: IR FLUORO GUIDE PORT INSERTION RIGHT: IMG5741

## 2017-05-03 HISTORY — PX: IR US GUIDE VASC ACCESS RIGHT: IMG2390

## 2017-05-03 LAB — CBC
HCT: 38.4 % (ref 36.0–46.0)
HEMOGLOBIN: 13.1 g/dL (ref 12.0–15.0)
MCH: 30 pg (ref 26.0–34.0)
MCHC: 34.1 g/dL (ref 30.0–36.0)
MCV: 88.1 fL (ref 78.0–100.0)
PLATELETS: 320 10*3/uL (ref 150–400)
RBC: 4.36 MIL/uL (ref 3.87–5.11)
RDW: 12.1 % (ref 11.5–15.5)
WBC: 6.8 10*3/uL (ref 4.0–10.5)

## 2017-05-03 LAB — PROTIME-INR
INR: 0.95
PROTHROMBIN TIME: 12.6 s (ref 11.4–15.2)

## 2017-05-03 MED ORDER — CEFAZOLIN SODIUM-DEXTROSE 2-4 GM/100ML-% IV SOLN
2.0000 g | INTRAVENOUS | Status: AC
  Administered 2017-05-03: 2 g via INTRAVENOUS

## 2017-05-03 MED ORDER — LIDOCAINE-EPINEPHRINE (PF) 2 %-1:200000 IJ SOLN
INTRAMUSCULAR | Status: AC | PRN
  Administered 2017-05-03: 10 mL

## 2017-05-03 MED ORDER — SODIUM CHLORIDE 0.9 % IV SOLN
INTRAVENOUS | Status: DC
  Administered 2017-05-03: 12:00:00 via INTRAVENOUS

## 2017-05-03 MED ORDER — LIDOCAINE HCL 1 % IJ SOLN
INTRAMUSCULAR | Status: AC | PRN
  Administered 2017-05-03: 20 mL

## 2017-05-03 MED ORDER — CEFAZOLIN SODIUM-DEXTROSE 2-4 GM/100ML-% IV SOLN
INTRAVENOUS | Status: AC
  Administered 2017-05-03: 2 g via INTRAVENOUS
  Filled 2017-05-03: qty 100

## 2017-05-03 MED ORDER — MIDAZOLAM HCL 2 MG/2ML IJ SOLN
INTRAMUSCULAR | Status: AC
  Filled 2017-05-03: qty 4

## 2017-05-03 MED ORDER — LIDOCAINE HCL 1 % IJ SOLN
INTRAMUSCULAR | Status: AC
  Filled 2017-05-03: qty 20

## 2017-05-03 MED ORDER — FENTANYL CITRATE (PF) 100 MCG/2ML IJ SOLN
INTRAMUSCULAR | Status: AC | PRN
  Administered 2017-05-03 (×2): 50 ug via INTRAVENOUS

## 2017-05-03 MED ORDER — MIDAZOLAM HCL 2 MG/2ML IJ SOLN
INTRAMUSCULAR | Status: AC | PRN
  Administered 2017-05-03 (×4): 1 mg via INTRAVENOUS

## 2017-05-03 MED ORDER — FENTANYL CITRATE (PF) 100 MCG/2ML IJ SOLN
INTRAMUSCULAR | Status: AC
  Filled 2017-05-03: qty 2

## 2017-05-03 MED ORDER — LIDOCAINE-EPINEPHRINE (PF) 2 %-1:200000 IJ SOLN
INTRAMUSCULAR | Status: AC
  Filled 2017-05-03: qty 20

## 2017-05-03 MED ORDER — HEPARIN SOD (PORK) LOCK FLUSH 100 UNIT/ML IV SOLN
INTRAVENOUS | Status: AC | PRN
  Administered 2017-05-03: 500 [IU] via INTRAVENOUS

## 2017-05-03 MED ORDER — HEPARIN SOD (PORK) LOCK FLUSH 100 UNIT/ML IV SOLN
INTRAVENOUS | Status: AC
  Filled 2017-05-03: qty 5

## 2017-05-03 NOTE — Procedures (Signed)
Placement of right chest port.  Tip at SVC/RA junction.  Minimal blood loss and no immediate complication.

## 2017-05-03 NOTE — Discharge Instructions (Addendum)
You may take dressing off and bathe in 24 hours.  Implanted Port Insertion, Care After This sheet gives you information about how to care for yourself after your procedure. Your health care provider may also give you more specific instructions. If you have problems or questions, contact your health care provider. What can I expect after the procedure? After your procedure, it is common to have:  Discomfort at the port insertion site.  Bruising on the skin over the port. This should improve over 3-4 days.  Follow these instructions at home: [facility] care  After your port is placed, you will get a manufacturer's information card. The card has information about your port. Keep this card with you at all times.  Take care of the port as told by your health care provider. Ask your health care provider if you or a family member can get training for taking care of the port at home. A home health care nurse may also take care of the port.  Make sure to remember what type of port you have. Incision care  Follow instructions from your health care provider about how to take care of your port insertion site. Make sure you: ? Wash your hands with soap and water before you change your bandage (dressing). If soap and water are not available, use hand sanitizer. ? Change your dressing as told by your health care provider. ? Leave stitches (sutures), skin glue, or adhesive strips in place. These skin closures may need to stay in place for 2 weeks or longer. If adhesive strip edges start to loosen and curl up, you may trim the loose edges. Do not remove adhesive strips completely unless your health care provider tells you to do that.  Check your port insertion site every day for signs of infection. Check for: ? More redness, swelling, or pain. ? More fluid or blood. ? Warmth. ? Pus or a bad smell. General instructions  Do not take baths, swim, or use a hot tub until your health care provider  approves.  Do not lift anything that is heavier than 10 lb (4.5 kg) for a week, or as told by your health care provider.  Ask your health care provider when it is okay to: ? Return to work or school. ? Resume usual physical activities or sports.  Do not drive for 24 hours if you were given a medicine to help you relax (sedative).  Take over-the-counter and prescription medicines only as told by your health care provider.  Wear a medical alert bracelet in case of an emergency. This will tell any health care providers that you have a port.  Keep all follow-up visits as told by your health care provider. This is important. Contact a health care provider if:  You cannot flush your port with saline as directed, or you cannot draw blood from the port.  You have a fever or chills.  You have more redness, swelling, or pain around your port insertion site.  You have more fluid or blood coming from your port insertion site.  Your port insertion site feels warm to the touch.  You have pus or a bad smell coming from the port insertion site. Get help right away if:  You have chest pain or shortness of breath.  You have bleeding from your port that you cannot control. Summary  Take care of the port as told by your health care provider.  Change your dressing as told by your health care provider.  Keep all follow-up visits as told by your health care provider. This information is not intended to replace advice given to you by your health care provider. Make sure you discuss any questions you have with your health care provider. Document Released: 01/25/2013 Document Revised: 02/26/2016 Document Reviewed: 02/26/2016 Elsevier Interactive Patient Education  2017 [place] An implanted port is a type of central line that is placed under the skin. Central lines are used to provide IV access when treatment or nutrition needs to be given through a persons  veins. Implanted ports are used for long-term IV access. An implanted port may be placed because:  You need IV medicine that would be irritating to the small veins in your hands or arms.  You need long-term IV medicines, such as antibiotics.  You need IV nutrition for a long period.  You need frequent blood draws for lab tests.  You need dialysis.  Implanted ports are usually placed in the chest area, but they can also be placed in the upper arm, the abdomen, or the leg. An implanted port has two main parts:  Reservoir. The reservoir is round and will appear as a small, raised area under your skin. The reservoir is the part where a needle is inserted to give medicines or draw blood.  Catheter. The catheter is a thin, flexible tube that extends from the reservoir. The catheter is placed into a large vein. Medicine that is inserted into the reservoir goes into the catheter and then into the vein.  How will I care for my incision site? Do not get the incision site wet. Bathe or shower as directed by your health care provider. How is my port accessed? Special steps must be taken to access the port:  Before the port is accessed, a numbing cream can be placed on the skin. This helps numb the skin over the port site.  Your health care provider uses a sterile technique to access the port. ? Your health care provider must put on a mask and sterile gloves. ? The skin over your port is cleaned carefully with an antiseptic and allowed to dry. ? The port is gently pinched between sterile gloves, and a needle is inserted into the port.  Only "non-coring" port needles should be used to access the port. Once the port is accessed, a blood return should be checked. This helps ensure that the port is in the vein and is not clogged.  If your port needs to remain accessed for a constant infusion, a clear (transparent) bandage will be placed over the needle site. The bandage and needle will need to be  changed every week, or as directed by your health care provider.  Keep the bandage covering the needle clean and dry. Do not get it wet. Follow your health care providers instructions on how to take a shower or bath while the port is accessed.  If your port does not need to stay accessed, no bandage is needed over the port.  What is flushing? Flushing helps keep the port from getting clogged. Follow your health care providers instructions on how and when to flush the port. Ports are usually flushed with saline solution or a medicine called heparin. The need for flushing will depend on how the port is used.  If the port is used for intermittent medicines or blood draws, the port will need to be flushed: ? After medicines have been given. ? After blood has  been drawn. ? As part of routine maintenance.  If a constant infusion is running, the port may not need to be flushed.  How long will my port stay implanted? The port can stay in for as long as your health care provider thinks it is needed. When it is time for the port to come out, surgery will be done to remove it. The procedure is similar to the one performed when the port was put in. When should I seek immediate medical care? When you have an implanted port, you should seek immediate medical care if:  You notice a bad smell coming from the incision site.  You have swelling, redness, or drainage at the incision site.  You have more swelling or pain at the port site or the surrounding area.  You have a fever that is not controlled with medicine.  This information is not intended to replace advice given to you by your health care provider. Make sure you discuss any questions you have with your health care provider. Document Released: 04/06/2005 Document Revised: 09/12/2015 Document Reviewed: 12/12/2012 Elsevier Interactive Patient Education  2017 [place]. Moderate Conscious Sedation, Adult, Care After These instructions  provide you with information about caring for yourself after your procedure. Your health care provider may also give you more specific instructions. Your treatment has been planned according to current medical practices, but problems sometimes occur. Call your health care provider if you have any problems or questions after your procedure. What can I expect after the procedure? After your procedure, it is common:  To feel sleepy for several hours.  To feel clumsy and have poor balance for several hours.  To have poor judgment for several hours.  To vomit if you eat too soon.  Follow these instructions at home: For at least 24 hours after the procedure:   Do not: ? Participate in activities where you could fall or become injured. ? Drive. ? Use heavy machinery. ? Drink alcohol. ? Take sleeping pills or medicines that cause drowsiness. ? Make important decisions or sign legal documents. ? Take care of children on your own.  Rest. Eating and drinking  Follow the diet recommended by your health care provider.  If you vomit: ? Drink water, juice, or soup when you can drink without vomiting. ? Make sure you have little or no nausea before eating solid foods. General instructions  Have a responsible adult stay with you until you are awake and alert.  Take over-the-counter and prescription medicines only as told by your health care provider.  If you smoke, do not smoke without supervision.  Keep all follow-up visits as told by your health care provider. This is important. Contact a health care provider if:  You keep feeling nauseous or you keep vomiting.  You feel light-headed.  You develop a rash.  You have a fever. Get help right away if:  You have trouble breathing. This information is not intended to replace advice given to you by your health care provider. Make sure you discuss any questions you have with your health care provider. Document Released: 01/25/2013  Document Revised: 09/09/2015 Document Reviewed: 07/27/2015 Elsevier Interactive Patient Education  [employer].

## 2017-05-03 NOTE — H&P (Signed)
Chief Complaint: Breast Cancer  Referring Physician(s): Feng,Yan  Supervising Physician: Markus Daft  Patient Status: [facility] - Out-pt  History of Present Illness: Carrie Miller is a 22 y.o. female who presented to the ED on 04/15/2017 c/o a breast mass.  Imaging done revealed a suspicious mass which was biopsied by Dr. Enriqueta Shutter.  Pathology revealed =  1. Breast, left, needle core biopsy, 4:00 o'clock, ribbon clip - INVASIVE DUCTAL CARCINOMA. 2. Breast, left, needle core biopsy, 2:00 o'clock, coil clip - INVASIVE DUCTAL CARCINOMA. 3. Lymph node, needle/core biopsy, left axilla, spiral hydromark - DUCTAL CARCINOMA.  We are asked to place a tunneled central catheter with Port for chemotherapy.  She is NPO.  She does not take blood thinners.  She speaks some [place]. Her brother assisted with interpretation.  Past Medical History:  Diagnosis Date  . Breast cancer ([place])   . Family history of cancer     History reviewed. No pertinent surgical history.  Allergies: Patient has no known allergies.  Medications: Prior to Admission medications   Medication Sig Start Date End Date Taking? Authorizing Provider  amoxicillin (AMOXIL) 500 MG capsule Take 500 mg by mouth 3 (three) times daily.   Yes [provider]  ALPRAZolam (XANAX) 0.25 MG tablet Take 1 tablet (0.25 mg total) by mouth at bedtime as needed for anxiety. 04/28/17   [name], NP  ibuprofen (ADVIL,MOTRIN) 200 MG tablet Take 2 tablets (400 mg total) by mouth every 6 (six) hours as needed. 03/31/17   Zigmund Gottron, NP  lidocaine-prilocaine (EMLA) cream Apply to affected area once 04/29/17   [name], MD  ondansetron (ZOFRAN) 8 MG tablet Take 1 tablet (8 mg total) by mouth 2 (two) times daily as needed. Start on the third day after chemotherapy. 04/29/17   [name], MD  prochlorperazine (COMPAZINE) 10 MG tablet Take 1 tablet (10 mg total) by mouth every 6 (six) hours as needed (Nausea or  vomiting). 04/29/17   [name], MD     Family History  Problem Relation Age of Onset  . Hyperlipidemia Mother   . Hypertension Mother   . Prostate cancer Father 78       currently 69  . Stomach cancer Paternal Aunt 52       deceased 42s  . Prostate cancer Paternal Uncle 61       deceased 41    Social History   Socioeconomic History  . Marital status: Single    Spouse name: None  . Number of children: None  . Years of education: None  . Highest education level: None  Social Needs  . Financial resource strain: None  . Food insecurity - worry: None  . Food insecurity - inability: None  . Transportation needs - medical: None  . Transportation needs - non-medical: None  Occupational History  . Occupation: not working   Tobacco Use  . Smoking status: Never Smoker  . Smokeless tobacco: Never Used  Substance and Sexual Activity  . Alcohol use: No    Frequency: Never  . Drug use: No  . Sexual activity: No    Birth control/protection: None  Other Topics Concern  . None  Social History Narrative  . None    Review of Systems: A 12 point ROS discussed  Review of Systems  Constitutional: Negative.   HENT: Negative.   Respiratory: Negative.   Cardiovascular: Negative.   Gastrointestinal: Negative.   Genitourinary: Negative.   Musculoskeletal: Negative.   Skin: Negative.  Neurological: Negative.   Hematological: Negative.   Psychiatric/Behavioral: Negative.     Vital Signs: BP 129/81 (BP Location: Right Arm)   Pulse 75   Temp (!) 97.2 F (36.2 C) (Oral)   Resp 16   SpO2 100%   Physical Exam  Constitutional: She is oriented to person, place, and time. She appears well-developed.  HENT:  Head: Normocephalic and atraumatic.  Eyes: EOM are normal.  Neck: Normal range of motion.  Cardiovascular: Normal rate, regular rhythm and normal heart sounds.  Pulmonary/Chest: Effort normal and breath sounds normal.  Abdominal: Soft.  Musculoskeletal: Normal range of  motion.  Neurological: She is alert and oriented to person, place, and time.  Skin: Skin is warm and dry.  Psychiatric: She has a normal mood and affect. Her behavior is normal. Judgment and thought content normal.  Vitals reviewed.   Imaging: Mr Breast Bilateral W Wo Contrast Inc Cad  Result Date: 04/30/2017 CLINICAL DATA:  Recent diagnosis of left breast cancer metastatic to axillary lymph node. Ultrasound-guided biopsies dated 04/19/2017. Ultrasound-guided biopsy of the dominant mass in the left breast at the 4 o'clock axis revealed grade 3 invasive ductal carcinoma. Ultrasound-guided biopsy of the additional mass in the left breast at the 2 o'clock axis revealed grade 3 invasive ductal carcinoma. Ultrasound-guided biopsy of the abnormal lymph node in the left axilla revealed metastasis. LABS:  Not applicable EXAM: BILATERAL BREAST MRI WITH AND WITHOUT CONTRAST TECHNIQUE: Multiplanar, multisequence MR images of both breasts were obtained prior to and following the intravenous administration of 19 ml of MultiHance. THREE-DIMENSIONAL MR IMAGE RENDERING ON INDEPENDENT WORKSTATION: Three-dimensional MR images were rendered by post-processing of the original MR data on an independent workstation. The three-dimensional MR images were interpreted, and findings are reported in the following complete MRI report for this study. Three dimensional images were evaluated at the independent DynaCad workstation COMPARISON:  Diagnostic mammogram and ultrasound dated 04/15/2017. Ultrasound-guided biopsies dated 04/19/2017 and postprocedure clip film. FINDINGS: Breast composition: b. Scattered fibroglandular tissue. Background parenchymal enhancement: Mild Right breast: No suspicious enhancing mass, non mass enhancement or secondary signs of malignancy are identified within the right breast. No edema or skin thickening. Left breast: Dominant enhancing mass within the lower outer quadrant of the left breast, 4 o'clock  axis, at posterior depth, measures 4.8 x 4.3 x 6 cm (transverse by AP by craniocaudal dimensions), with associated biopsy clip artifact, consistent with known biopsy-proven invasive carcinoma (series 10601, image 180). Post biopsy hematoma is seen lateral to the dominant mass. Additional enhancing mass and surrounding non mass enhancement within the upper-outer quadrant of the left breast, 2 o'clock axis, at posterior depth, measures 2.5 x 2.3 x 2 cm (transverse by AP by craniocaudal dimensions), with associated biopsy clip artifact, consistent with second known biopsy-proven invasive carcinoma (series 10601, image 121). Contiguous with the left breast mass at the 2 o'clock axis is additional non mass enhancement which extends anteriorly for approximately 3 cm (series 10602, image 109) extending the overall measurement for the mass and non mass enhancement at the 2 o'clock axis to 5.5 cm. There is diffuse edema within the left breast, with a prominent component of edema extending posteriorly to the anterior surface of the pectoralis muscle (series 4, image 62) and diffuse skin thickening throughout the left breast suggesting inflammatory breast cancer. Lymph nodes: Biopsy-proven metastatic lymph node within the left axilla, with associated biopsy clip, measures 4.1 cm greatest dimension. Two additional borderline prominent lymph nodes within the adjacent left axilla, with cortical  thickness measurements of 4 mm and 6 mm respectively (series 10601, images 71 and 74 respectively). No enlarged or morphologically abnormal lymph nodes are identified within the right axilla or within the internal mammary chain regions. Ancillary findings:  None. IMPRESSION: 1. Biopsy-proven invasive carcinoma within the lower outer quadrant of the LEFT breast, 4 o'clock axis, at posterior depth, measuring 4.8 cm, with associated biopsy clip artifact. 2. Biopsy-proven invasive carcinoma within the upper-outer quadrant of the LEFT breast, 2  o'clock axis, at posterior depth, measuring 2.5 cm, with associated biopsy clip artifact. However, contiguous non mass enhancement along the posterior margin of this 2 o'clock mass and extending 3 cm anteriorly from the mass increases the overall measurement to 5.5 cm greatest dimension (AP). 3. Diffuse edema throughout the LEFT breast, with particularly prominent component of edema extending posteriorly to abut the anterior surface of the pectoralis muscle, and diffuse skin thickening throughout the left breast. This almost certainly indicates INFLAMMATORY BREAST CANCER. 4. Biopsy-proven metastatic lymph node within the LEFT axilla measures 4.1 cm. Two additional borderline prominent lymph nodes within the left axilla. No enlarged lymph nodes within the right axilla or internal mammary chain regions. 5. No evidence of malignancy within the RIGHT breast. RECOMMENDATION: Per current treatment plan for patient's known left breast cancer (2 biopsy-proven sites) and metastatic lymph node in the left axilla. BI-RADS CATEGORY  6: Known biopsy-proven malignancy. Electronically Signed   By: Franki Cabot M.D.   On: 04/30/2017 14:02   Mm Radiologist Eval And Mgmt  Result Date: 04/21/2017 EXAM: ESTABLISHED PATIENT OFFICE VISIT - LEVEL II CHIEF COMPLAINT: Patient returns to discuss biopsy results. She is accompanied by her brother and sister-in-law. Family members assisted with interpretation. HISTORY OF PRESENT ILLNESS: Status post ultrasound-guided core biopsy of mass in the left axilla, left breast 2 o'clock, and left breast 4 o'clock locations. Since the biopsies, the patient has done well. She reports no bruising or bleeding. EXAM: Biopsy sites are clean and dry, per physical exam by Terie Purser, R.N. PATHOLOGY: 1. Breast, left, needle core biopsy, 4:00 o'clock, ribbon clip- INVASIVE DUCTAL CARCINOMA 2. Breast, left, needle core biopsy, 2:00 o'clock, coil clip- INVASIVE DUCTAL CARCINOMA 3. Lymph node, needle/core  biopsy, left axilla, spiral HydroMARK- DUCTAL CARCINOMA lymph nodal tissue is not definitely identified in specimen 3. Each specimen is concordant with the imaging findings. ASSESSMENT AND PLAN: ASSESSMENT AND PLAN We discussed the pathology results. Questions were answered. A brief description of breast cancer treatment was discussed with the family. The patient is scheduled to see Dr. Barry Dienes for consultation on 04/26/2017. Electronically Signed   By: Nolon Nations M.D.   On: 04/21/2017 14:18   US Breast Ltd Uni Left Inc Axilla  Result Date: 04/15/2017 CLINICAL DATA:  22 year old female with a palpable mass in the left breast. This is patient's baseline mammogram. EXAM: 2D DIGITAL DIAGNOSTIC BILATERAL MAMMOGRAM WITH CAD AND ADJUNCT TOMO ULTRASOUND LEFT BREAST COMPARISON:  None. ACR Breast Density Category b: There are scattered areas of fibroglandular density. FINDINGS: Bilateral 2D CC and MLO views were obtained, with additional 3D tomosynthesis, and with additional spot compression view of the outer left breast corresponding to the area of clinical concern, with overlying skin marker in place. Left breast: There is a dense irregular mass within the outer left breast, measuring approximately 4.5 cm greatest dimension, corresponding to the area of clinical concern. There is an additional mass within the upper-outer quadrant of the left breast, with partially circumscribed and partially irregular margins, measuring approximately  1.8 cm greatest dimension, corresponding as an incidental finding. There is a partially imaged mass within the left axilla. Right breast: There are no dominant masses, suspicious calcifications or secondary signs of malignancy within the right breast. Mammographic images were processed with CAD. Left breast: Targeted ultrasound is performed, showing an irregular mass in the left breast at the 4 o'clock axis, 8 cm from the nipple, measuring 5.2 x 4.3 x 4.3 cm, with internal  vascularity, corresponding to the mammographic finding and palpable lump. There is an additional irregular hypoechoic mass in the left breast at the 2 o'clock axis, 8 cm from the nipple, measuring 1.6 x 1.3 x 1.3 cm, with internal vascularity, corresponding to the additional mass seen within the outer left breast on mammogram, suspected satellite mass. Lastly, there is an oval circumscribed hypoechoic mass in the left breast at the 2 o'clock axis, 10 cm from the nipple, axillary tail region, with heterogeneous echotexture, measuring 3 x 2.3 x 2.8 cm, suspected lymph node completely replaced by tumor. Remainder of the left axilla was evaluated with ultrasound showing no additional enlarged or morphologically abnormal lymph nodes. IMPRESSION: 1. Highly suspicious irregular mass within the left breast at the 4 o'clock axis, 8 cm from the nipple, measuring 5.2 cm, corresponding to the area of clinical concern. Ultrasound-guided biopsy is recommended. 2. Additional suspicious irregular mass within the left breast at the 2 o'clock axis, 8 cm from the nipple, measuring 1.6 cm, corresponding to the mammographic finding. Ultrasound-guided biopsy is recommended. 3. Additional suspicious mass within the left breast at the 2 o'clock axis, 10 cm from the nipple, axillary tail region, measuring 3 cm, suspected lymph node completely replaced by tumor. Ultrasound-guided biopsy is recommended. 4. No evidence of malignancy within the right breast. RECOMMENDATION: 1. Ultrasound-guided biopsy of the highly suspicious mass within the left breast at the 4 o'clock axis, measuring 5.2 cm. 2. Ultrasound-guided biopsy of the additional suspicious mass in the left breast at the 2 o'clock axis, measuring 1.6 cm. 3. Ultrasound-guided biopsy of the suspected abnormal lymph node in the left breast at the 2 o'clock axis, axillary tail region, measuring 3 cm. Ultrasound-guided biopsies are scheduled for December 31st. I have discussed the findings  and recommendations with the patient. Results were also provided in writing at the conclusion of the visit. If applicable, a reminder letter will be sent to the patient regarding the next appointment. BI-RADS CATEGORY  5: Highly suggestive of malignancy. Electronically Signed   By: Franki Cabot M.D.   On: 04/15/2017 10:14   Mm Diag Breast Tomo Bilateral  Result Date: 04/15/2017 CLINICAL DATA:  22 year old female with a palpable mass in the left breast. This is patient's baseline mammogram. EXAM: 2D DIGITAL DIAGNOSTIC BILATERAL MAMMOGRAM WITH CAD AND ADJUNCT TOMO ULTRASOUND LEFT BREAST COMPARISON:  None. ACR Breast Density Category b: There are scattered areas of fibroglandular density. FINDINGS: Bilateral 2D CC and MLO views were obtained, with additional 3D tomosynthesis, and with additional spot compression view of the outer left breast corresponding to the area of clinical concern, with overlying skin marker in place. Left breast: There is a dense irregular mass within the outer left breast, measuring approximately 4.5 cm greatest dimension, corresponding to the area of clinical concern. There is an additional mass within the upper-outer quadrant of the left breast, with partially circumscribed and partially irregular margins, measuring approximately 1.8 cm greatest dimension, corresponding as an incidental finding. There is a partially imaged mass within the left axilla. Right breast: There are  no dominant masses, suspicious calcifications or secondary signs of malignancy within the right breast. Mammographic images were processed with CAD. Left breast: Targeted ultrasound is performed, showing an irregular mass in the left breast at the 4 o'clock axis, 8 cm from the nipple, measuring 5.2 x 4.3 x 4.3 cm, with internal vascularity, corresponding to the mammographic finding and palpable lump. There is an additional irregular hypoechoic mass in the left breast at the 2 o'clock axis, 8 cm from the nipple,  measuring 1.6 x 1.3 x 1.3 cm, with internal vascularity, corresponding to the additional mass seen within the outer left breast on mammogram, suspected satellite mass. Lastly, there is an oval circumscribed hypoechoic mass in the left breast at the 2 o'clock axis, 10 cm from the nipple, axillary tail region, with heterogeneous echotexture, measuring 3 x 2.3 x 2.8 cm, suspected lymph node completely replaced by tumor. Remainder of the left axilla was evaluated with ultrasound showing no additional enlarged or morphologically abnormal lymph nodes. IMPRESSION: 1. Highly suspicious irregular mass within the left breast at the 4 o'clock axis, 8 cm from the nipple, measuring 5.2 cm, corresponding to the area of clinical concern. Ultrasound-guided biopsy is recommended. 2. Additional suspicious irregular mass within the left breast at the 2 o'clock axis, 8 cm from the nipple, measuring 1.6 cm, corresponding to the mammographic finding. Ultrasound-guided biopsy is recommended. 3. Additional suspicious mass within the left breast at the 2 o'clock axis, 10 cm from the nipple, axillary tail region, measuring 3 cm, suspected lymph node completely replaced by tumor. Ultrasound-guided biopsy is recommended. 4. No evidence of malignancy within the right breast. RECOMMENDATION: 1. Ultrasound-guided biopsy of the highly suspicious mass within the left breast at the 4 o'clock axis, measuring 5.2 cm. 2. Ultrasound-guided biopsy of the additional suspicious mass in the left breast at the 2 o'clock axis, measuring 1.6 cm. 3. Ultrasound-guided biopsy of the suspected abnormal lymph node in the left breast at the 2 o'clock axis, axillary tail region, measuring 3 cm. Ultrasound-guided biopsies are scheduled for December 31st. I have discussed the findings and recommendations with the patient. Results were also provided in writing at the conclusion of the visit. If applicable, a reminder letter will be sent to the patient regarding the next  appointment. BI-RADS CATEGORY  5: Highly suggestive of malignancy. Electronically Signed   By: Franki Cabot M.D.   On: 04/15/2017 10:14   [place] Axillary Node Core Biopsy Left  Result Date: 04/19/2017 CLINICAL DATA:  Patient presents for ultrasound-guided core biopsy of lower left axillary mass. Biopsies of other left breast masses are dictated separately. EXAM: [place] AXILLARY BIOPSY LEFT COMPARISON:  Previous exam(s). FINDINGS: I met with the patient and we discussed the procedure of ultrasound-guided biopsy, including benefits and alternatives. We discussed the high likelihood of a successful procedure. We discussed the risks of the procedure, including infection, bleeding, tissue injury, clip migration, and inadequate sampling. Informed written consent was given. The usual time-out protocol was performed immediately prior to the procedure. Using sterile technique and 1% Lidocaine as local anesthetic, under direct ultrasound visualization, a 14 gauge spring-loaded device was used to perform biopsy of mass in the lower left axilla using a lateral approach. At the conclusion of the procedure a spiral shaped HydroMARK tissue marker clip was deployed into the biopsy cavity. Follow up 2 view mammogram was performed and dictated separately. IMPRESSION: Ultrasound guided biopsy of left axillary mass. No apparent complications. Electronically Signed   By: Nolon Nations M.D.  On: 04/19/2017 16:08   Mm Clip Placement Left  Result Date: 04/19/2017 CLINICAL DATA:  Status post ultrasound-guided core biopsy of mass in the 4 o'clock location left breast, 2 o'clock location left breast, and mass in the lower left axilla. EXAM: DIAGNOSTIC LEFT MAMMOGRAM POST ULTRASOUND BIOPSY x3 COMPARISON:  Previous exam(s). FINDINGS: Mammographic images were obtained following ultrasound guided biopsy of mass in the 4 o'clock location of the left breast with ribbon shaped clip deployment. Ribbon shaped clip is identified in a mass in the  lower outer quadrant as expected. Following biopsy of mass in the 2 o'clock location, a coil shaped clip was deployed. Coil shaped clip is identified within the mass in the upper-outer quadrant. Spiral shaped clip is identified within the lower left axillary mass following biopsy of this mass. IMPRESSION: Tissue marker clips are in the expected locations after biopsy. Final Assessment: Post Procedure Mammograms for Marker Placement Electronically Signed   By: Nolon Nations M.D.   On: 04/19/2017 16:10   [place] Lt Breast Bx W Loc Dev 1st Lesion Img Bx Spec US Guide  Result Date: 04/19/2017 CLINICAL DATA:  Patient presents for ultrasound-guided core biopsy of mass in the 4 o'clock location of the left breast. Biopsies of other left breast masses dictated separately. EXAM: ULTRASOUND GUIDED LEFT BREAST CORE NEEDLE BIOPSY COMPARISON:  Previous exam(s). FINDINGS: I met with the patient and we discussed the procedure of ultrasound-guided biopsy, including benefits and alternatives. We discussed the high likelihood of a successful procedure. We discussed the risks of the procedure, including infection, bleeding, tissue injury, clip migration, and inadequate sampling. Informed written consent was given. The usual time-out protocol was performed immediately prior to the procedure. Lesion quadrant: Lower outer quadrant Using sterile technique and 1% Lidocaine as local anesthetic, under direct ultrasound visualization, a 12 gauge spring-loaded device was used to perform biopsy of mass in the 4 o'clock location of the left breast using a lateral approach. At the conclusion of the procedure a ribbon shaped tissue marker clip was deployed into the biopsy cavity. Follow up 2 view mammogram was performed and dictated separately. IMPRESSION: Ultrasound guided biopsy of left breast mass. No apparent complications. Electronically Signed   By: Nolon Nations M.D.   On: 04/19/2017 16:04   [place] Lt Breast Bx W Loc Dev Ea Add Lesion  Img Bx Spec US Guide  Result Date: 04/19/2017 CLINICAL DATA:  Patient presents for ultrasound-guided core biopsy of mass in the 2 o'clock location of the left breast. Biopsies of other left breast masses are dictated separately. EXAM: ULTRASOUND GUIDED LEFT BREAST CORE NEEDLE BIOPSY COMPARISON:  Previous exam(s). FINDINGS: I met with the patient and we discussed the procedure of ultrasound-guided biopsy, including benefits and alternatives. We discussed the high likelihood of a successful procedure. We discussed the risks of the procedure, including infection, bleeding, tissue injury, clip migration, and inadequate sampling. Informed written consent was given. The usual time-out protocol was performed immediately prior to the procedure. Lesion quadrant: Upper outer quadrant Using sterile technique and 1% Lidocaine as local anesthetic, under direct ultrasound visualization, a 12 gauge spring-loaded device was used to perform biopsy of mass and 2 o'clock location of the left breast 6 using a lateral approach. At the conclusion of the procedure a coil shaped tissue marker clip was deployed into the biopsy cavity. Follow up 2 view mammogram was performed and dictated separately. IMPRESSION: Ultrasound guided biopsy of mass in the 2 o'clock location of the left breast. No apparent complications. Electronically  Signed   By: Nolon Nations M.D.   On: 04/19/2017 16:06    Labs:  CBC: Recent Labs    05/03/17 1129  WBC 6.8  HGB 13.1  HCT 38.4  PLT 320    COAGS: Recent Labs    05/03/17 1129  INR 0.95    BMP: No results for input(s): NA, K, CL, CO2, GLUCOSE, BUN, CALCIUM, CREATININE, GFRNONAA, GFRAA in the last 8760 hours.  Invalid input(s): CMP  LIVER FUNCTION TESTS: No results for input(s): BILITOT, AST, ALT, ALKPHOS, PROT, ALBUMIN in the last 8760 hours.  TUMOR MARKERS: No results for input(s): AFPTM, CEA, CA199, CHROMGRNA in the last 8760 hours.  Assessment and Plan:  Breast  Cancer  Will proceed with placement of tunneled catheter with [place] today by Dr. Anselm Pancoast.  Risks and benefits discussed with the patient including, but not limited to bleeding, infection, pneumothorax, or fibrin sheath development and need for additional procedures.  All of the patient's questions were answered, patient is agreeable to proceed. Consent signed and in chart.   Thank you for this interesting consult.  I greatly enjoyed meeting Samanthamarie Ezzell and look forward to participating in their care.  A copy of this report was sent to the requesting provider on this date.  Electronically Signed: Murrell Redden, PA-C 05/03/2017, 12:32 PM   I spent a total of  30 Minutes in face to face in clinical consultation, greater than 50% of which was counseling/coordinating care for port a cath.

## 2017-05-05 ENCOUNTER — Ambulatory Visit (HOSPITAL_COMMUNITY)
Admission: RE | Admit: 2017-05-05 | Discharge: 2017-05-05 | Disposition: A | Discharge status: Home / Self Care | Payer: Medicaid Other | Source: Ambulatory Visit | Attending: Nurse Practitioner | Admitting: Nurse Practitioner

## 2017-05-05 ENCOUNTER — Telehealth: Payer: Self-pay | Admitting: Nurse Practitioner

## 2017-05-05 ENCOUNTER — Encounter: Payer: Self-pay | Admitting: *Deleted

## 2017-05-05 DIAGNOSIS — R42 Dizziness and giddiness: Secondary | ICD-10-CM | POA: Diagnosis present

## 2017-05-05 DIAGNOSIS — G4452 New daily persistent headache (NDPH): Secondary | ICD-10-CM | POA: Diagnosis present

## 2017-05-05 DIAGNOSIS — C50812 Malignant neoplasm of overlapping sites of left female breast: Secondary | ICD-10-CM | POA: Diagnosis not present

## 2017-05-05 MED ORDER — GADOBENATE DIMEGLUMINE 529 MG/ML IV SOLN
20.0000 mL | Freq: Once | INTRAVENOUS | Status: AC | PRN
  Administered 2017-05-05: 19 mL via INTRAVENOUS

## 2017-05-05 NOTE — Telephone Encounter (Signed)
I called patient's family to inform her brain MRI is normal, no cancer. She voices understanding. I reviewed the schedule: bone scan, CT CAP, chemo edu class on 1/18. She is aware.

## 2017-05-07 ENCOUNTER — Inpatient Hospital Stay: Payer: Medicaid Other

## 2017-05-07 ENCOUNTER — Ambulatory Visit (HOSPITAL_COMMUNITY)
Admission: RE | Admit: 2017-05-07 | Discharge: 2017-05-07 | Disposition: A | Discharge status: Home / Self Care | Payer: Medicaid Other | Source: Ambulatory Visit | Attending: Nurse Practitioner | Admitting: Nurse Practitioner

## 2017-05-07 ENCOUNTER — Telehealth: Payer: Self-pay | Admitting: Nurse Practitioner

## 2017-05-07 ENCOUNTER — Encounter (HOSPITAL_COMMUNITY)
Admission: RE | Admit: 2017-05-07 | Discharge: 2017-05-07 | Disposition: A | Discharge status: Home / Self Care | Payer: Medicaid Other | Source: Ambulatory Visit | Attending: Nurse Practitioner | Admitting: Nurse Practitioner

## 2017-05-07 DIAGNOSIS — C50812 Malignant neoplasm of overlapping sites of left female breast: Secondary | ICD-10-CM | POA: Insufficient documentation

## 2017-05-07 DIAGNOSIS — K769 Liver disease, unspecified: Secondary | ICD-10-CM | POA: Insufficient documentation

## 2017-05-07 DIAGNOSIS — R234 Changes in skin texture: Secondary | ICD-10-CM | POA: Diagnosis not present

## 2017-05-07 MED ORDER — IOPAMIDOL (ISOVUE-300) INJECTION 61%
100.0000 mL | Freq: Once | INTRAVENOUS | Status: AC | PRN
  Administered 2017-05-07: 100 mL via INTRAVENOUS

## 2017-05-07 MED ORDER — IOPAMIDOL (ISOVUE-300) INJECTION 61%
INTRAVENOUS | Status: AC
  Filled 2017-05-07: qty 100

## 2017-05-07 MED ORDER — TECHNETIUM TC 99M MEDRONATE IV KIT
21.9000 | PACK | Freq: Once | INTRAVENOUS | Status: AC | PRN
  Administered 2017-05-07: 21.9 via INTRAVENOUS

## 2017-05-07 NOTE — Telephone Encounter (Signed)
Attempted to reach patient to discuss their questions regarding cold cap for hair loss prevention; no answer, no message. Will retry to reach patient.

## 2017-05-10 ENCOUNTER — Ambulatory Visit (INDEPENDENT_AMBULATORY_CARE_PROVIDER_SITE_OTHER): Payer: Medicaid Other | Admitting: Obstetrics & Gynecology

## 2017-05-10 ENCOUNTER — Other Ambulatory Visit (HOSPITAL_COMMUNITY)
Admission: RE | Admit: 2017-05-10 | Discharge: 2017-05-10 | Disposition: A | Discharge status: Home / Self Care | Payer: Medicaid Other | Source: Ambulatory Visit | Attending: Obstetrics & Gynecology | Admitting: Obstetrics & Gynecology

## 2017-05-10 ENCOUNTER — Encounter: Payer: Self-pay | Admitting: Obstetrics & Gynecology

## 2017-05-10 VITALS — BP 138/83 | HR 85 | Ht 64.96 in | Wt 202.0 lb

## 2017-05-10 DIAGNOSIS — Z124 Encounter for screening for malignant neoplasm of cervix: Secondary | ICD-10-CM | POA: Diagnosis present

## 2017-05-10 DIAGNOSIS — R87610 Atypical squamous cells of undetermined significance on cytologic smear of cervix (ASC-US): Secondary | ICD-10-CM | POA: Diagnosis present

## 2017-05-10 DIAGNOSIS — Z3202 Encounter for pregnancy test, result negative: Secondary | ICD-10-CM | POA: Diagnosis not present

## 2017-05-10 LAB — POCT PREGNANCY, URINE: Preg Test, Ur: NEGATIVE

## 2017-05-10 NOTE — Progress Notes (Signed)
Patient given informed consent, signed copy in the chart, time out was performed.  Placed in lithotomy position. Cervix viewed with speculum and colposcope after application of acetic acid.  04/15/2017 Satisfactory for evaluation endocervical/transformation zone component PRESENT. Abnormal     Diagnosis ATYPICAL SQUAMOUS CELLS CANNOT EXCLUDE A HIGH GRADE SQUAMOUS INTRAEPITHELIAL LESION (ASC-H). Abnormal    HPV NOT DETECTED   Comment: Normal Reference Range - NOT Detected  Material Submitted CervicoVaginal Pap [ThinPrep Imaged] Abnormal      Colposcopy adequate?  yes Acetowhite lesions? yes Punctation? no Mosaicism?  no Abnormal vasculature?  yes Biopsies? yes ECC? yes   Patient was given post procedure instructions.  She will return in 2 weeks for results. Pt reports begin virginal and is very worried about the status of her hymen.  I assured her that this exam does not affect her virginity.   Mustaf Antonacci L. Harraway-Smith, M.D., Cherlynn June

## 2017-05-11 ENCOUNTER — Encounter: Payer: Self-pay | Admitting: Genetic Counselor

## 2017-05-11 ENCOUNTER — Encounter: Payer: Self-pay | Admitting: *Deleted

## 2017-05-11 ENCOUNTER — Ambulatory Visit: Payer: Self-pay | Admitting: Genetic Counselor

## 2017-05-11 DIAGNOSIS — Z1379 Encounter for other screening for genetic and chromosomal anomalies: Secondary | ICD-10-CM

## 2017-05-11 NOTE — Progress Notes (Addendum)
               [facility]       Genetic Test Results    Patient Name: Carrie Miller Patient DOB: 04/10/1996 Patient Age: 22 y.o. Encounter Date: 05/11/2017  Referring Provider: Faera Byerly, MD Yan Feng, MD  Primary Care Provider: Patient, No Pcp Per   Ms. Raczkowski was called today through [facility] to discuss genetic test results. Please see the Genetics note from her visit on 04/29/17 for a detailed discussion of her personal and family history.  Genetic Testing: At the time of Ms. Alvelo's visit, she decided to pursue genetic testing of multiple genes associated with hereditary susceptibility to cancer. Testing included sequencing and deletion/duplication analysis. Testing did not reveal any pathogenic mutation in any of these genes.  A copy of the genetic test report will be scanned into Epic under the Media tab.  The genes analyzed were the 47 genes on [facility]'s Common Cancers panel (APC, ATM, AXIN2, BARD1, BMPR1A, BRCA1, BRCA2, BRIP1, CDH1, CDK4, CDKN2A, CHEK2, CTNNA1, DICER1, EPCAM, GREM1, HOXB13, KIT, MEN1, MLH1, MSH2, MSH3, MSH6, MUTYH, NBN, NF1, NTHL1, PALB2, PDGFRA, PMS2, POLD1, POLE, PTEN, RAD50, RAD51C, RAD51D, SDHA, SDHB, SDHC, SDHD, SMAD4, SMARCA4, STK11, TP53, TSC1, TSC2, VHL).  Since the current test is not perfect, it is possible that there may be a gene mutation that current testing cannot detect, but that chance is small. It is possible that a different genetic factor, which has not yet been discovered or is not on this panel, is responsible for the cancer diagnoses in the family. Given her very young, these results need to be interpreted with caution. No additional testing is recommended at this time for Ms. Tartt.  Five Variants of Uncertain Significance were detected:  APC c.2438A>G (p.Asn813Ser) - Reclassified on 07/21/17 to Variant, Likely Benign BARD1 c.782T>C (p.Leu261Pro) MSH3 c.1655C>T (p.Thr552Ile)  MSH6  c.2108T>C (p.Met703Thr)  NTHL1 c.470G>C (p.Arg157Pro)   This is still considered a normal result. While at this time, it is unknown if this finding is associated with increased cancer risk, the majority of these variants get reclassified to be inconsequential. Medical management should not be based on this finding. With time, we suspect the lab will determine the significance, if any. If we do learn more about it, we will try to contact Ms. Tomasik to discuss it further. It is important to stay in touch with us periodically and keep the address and phone number up to date.  Cancer Screening: Ms. Wheeling is recommended to follow the cancer screening guidelines provided by her physician.   Family Members: Given the young age of breast cancer in the family, women are recommended to speak with their own providers about having appropriate breast cancer screenings.  Any relative who had cancer at a young age or had a particularly rare cancer may also wish to pursue genetic testing. Genetic counselors can be located in other cities, by visiting the website of the National Society of Genetic Counselors (www.nsgc.org) and searching for a genetic counselor by zip code.   Family members are not recommended to get tested for the above VUSs outside of a research protocol as this finding has no implications for their medical management.  Lastly, cancer genetics is a rapidly advancing field and it is possible that new genetic tests will be appropriate for Ms. Rafanan in the future. We encourage her to remain in contact with us on an annual basis so we can update her personal and   family histories, and let her know of advances in cancer genetics that may benefit the family. Our contact number was provided. Ms. Lograsso is welcome to call anytime with additional questions.     Ofri Leitner, MS, CGC Certified Genetic Counselor phone: [phone] 

## 2017-05-12 ENCOUNTER — Inpatient Hospital Stay: Payer: Medicaid Other

## 2017-05-12 ENCOUNTER — Encounter: Payer: Self-pay | Admitting: *Deleted

## 2017-05-12 ENCOUNTER — Other Ambulatory Visit: Payer: Self-pay | Admitting: *Deleted

## 2017-05-12 ENCOUNTER — Telehealth: Payer: Self-pay | Admitting: Hematology

## 2017-05-12 ENCOUNTER — Inpatient Hospital Stay (HOSPITAL_BASED_OUTPATIENT_CLINIC_OR_DEPARTMENT_OTHER): Payer: Medicaid Other | Admitting: Nurse Practitioner

## 2017-05-12 ENCOUNTER — Other Ambulatory Visit: Payer: Self-pay | Admitting: Hematology

## 2017-05-12 ENCOUNTER — Encounter: Payer: Self-pay | Admitting: General Practice

## 2017-05-12 ENCOUNTER — Encounter: Payer: Self-pay | Admitting: Hematology

## 2017-05-12 ENCOUNTER — Encounter: Payer: Self-pay | Admitting: Nurse Practitioner

## 2017-05-12 VITALS — BP 103/73 | HR 75 | Temp 98.3°F | Resp 18 | Ht 64.96 in | Wt 203.0 lb

## 2017-05-12 DIAGNOSIS — C50812 Malignant neoplasm of overlapping sites of left female breast: Secondary | ICD-10-CM

## 2017-05-12 DIAGNOSIS — N939 Abnormal uterine and vaginal bleeding, unspecified: Secondary | ICD-10-CM

## 2017-05-12 DIAGNOSIS — Z171 Estrogen receptor negative status [ER-]: Secondary | ICD-10-CM

## 2017-05-12 DIAGNOSIS — Z95828 Presence of other vascular implants and grafts: Secondary | ICD-10-CM

## 2017-05-12 DIAGNOSIS — R51 Headache: Secondary | ICD-10-CM

## 2017-05-12 DIAGNOSIS — Z5111 Encounter for antineoplastic chemotherapy: Secondary | ICD-10-CM | POA: Diagnosis not present

## 2017-05-12 DIAGNOSIS — K769 Liver disease, unspecified: Secondary | ICD-10-CM

## 2017-05-12 DIAGNOSIS — C773 Secondary and unspecified malignant neoplasm of axilla and upper limb lymph nodes: Secondary | ICD-10-CM | POA: Diagnosis not present

## 2017-05-12 DIAGNOSIS — F419 Anxiety disorder, unspecified: Secondary | ICD-10-CM | POA: Diagnosis not present

## 2017-05-12 LAB — CMP (CANCER CENTER ONLY)
ALK PHOS: 86 U/L (ref 40–150)
ALT: 23 U/L (ref 0–55)
ANION GAP: 8 (ref 3–11)
AST: 27 U/L (ref 5–34)
Albumin: 3.8 g/dL (ref 3.5–5.0)
BUN: 14 mg/dL (ref 7–26)
CALCIUM: 9.4 mg/dL (ref 8.4–10.4)
CO2: 25 mmol/L (ref 22–29)
Chloride: 108 mmol/L (ref 98–109)
Creatinine: 0.82 mg/dL (ref 0.60–1.10)
GFR, Est AFR Am: >60 mL/min (ref >60)
GFR, Estimated: >60 mL/min (ref >60)
GLUCOSE: 84 mg/dL (ref 70–140)
POTASSIUM: 4 mmol/L (ref 3.3–4.7)
SODIUM: 141 mmol/L (ref 136–145)
Total Bilirubin: 0.3 mg/dL (ref 0.2–1.2)
Total Protein: 8.1 g/dL (ref 6.4–8.3)

## 2017-05-12 LAB — CBC WITH DIFFERENTIAL (CANCER CENTER ONLY)
BASOS PCT: 0 %
Basophils Absolute: 0 10*3/uL (ref 0.0–0.1)
EOS ABS: 0.2 10*3/uL (ref 0.0–0.5)
Eosinophils Relative: 3 %
HEMATOCRIT: 38 % (ref 34.8–46.6)
HEMOGLOBIN: 12.6 g/dL (ref 11.6–15.9)
Lymphocytes Relative: 30 %
Lymphs Abs: 2.1 10*3/uL (ref 0.9–3.3)
MCH: 29.9 pg (ref 25.1–34.0)
MCHC: 33.2 g/dL (ref 31.5–36.0)
MCV: 90 fL (ref 79.5–101.0)
MONOS PCT: 7 %
Monocytes Absolute: 0.5 10*3/uL (ref 0.1–0.9)
NEUTROS ABS: 4.2 10*3/uL (ref 1.5–6.5)
NEUTROS PCT: 60 %
Platelet Count: 268 10*3/uL (ref 145–400)
RBC: 4.22 MIL/uL (ref 3.70–5.45)
RDW: 12.5 % (ref 11.2–16.1)
WBC Count: 7 10*3/uL (ref 3.9–10.3)

## 2017-05-12 MED ORDER — SODIUM CHLORIDE 0.9 % IV SOLN
600.0000 mg/m2 | Freq: Once | INTRAVENOUS | Status: AC
  Administered 2017-05-12: 1200 mg via INTRAVENOUS
  Filled 2017-05-12: qty 60

## 2017-05-12 MED ORDER — SODIUM CHLORIDE 0.9 % IV SOLN
Freq: Once | INTRAVENOUS | Status: AC
  Administered 2017-05-12: 12:00:00 via INTRAVENOUS

## 2017-05-12 MED ORDER — PALONOSETRON HCL INJECTION 0.25 MG/5ML
0.2500 mg | Freq: Once | INTRAVENOUS | Status: AC
  Administered 2017-05-12: 0.25 mg via INTRAVENOUS

## 2017-05-12 MED ORDER — SODIUM CHLORIDE 0.9% FLUSH
10.0000 mL | INTRAVENOUS | Status: DC | PRN
  Administered 2017-05-12: 10 mL
  Filled 2017-05-12: qty 10

## 2017-05-12 MED ORDER — SODIUM CHLORIDE 0.9% FLUSH
10.0000 mL | Freq: Once | INTRAVENOUS | Status: DC
  Filled 2017-05-12: qty 10

## 2017-05-12 MED ORDER — PROCHLORPERAZINE MALEATE 10 MG PO TABS: 10.0000 mg | ORAL_TABLET | Freq: Four times a day (QID) | ORAL | 1 refills | Status: DC | PRN

## 2017-05-12 MED ORDER — SODIUM CHLORIDE 0.9 % IV SOLN
Freq: Once | INTRAVENOUS | Status: AC
  Administered 2017-05-12: 13:00:00 via INTRAVENOUS
  Filled 2017-05-12: qty 5

## 2017-05-12 MED ORDER — ANTICOAGULANT SODIUM CITRATE 4% (200MG/5ML) IV SOLN
5.0000 mL | Freq: Once | Status: AC
  Administered 2017-05-12: 5 mL via INTRAVENOUS
  Filled 2017-05-12: qty 5

## 2017-05-12 MED ORDER — DOXORUBICIN HCL CHEMO IV INJECTION 2 MG/ML
60.0000 mg/m2 | Freq: Once | INTRAVENOUS | Status: AC
  Administered 2017-05-12: 120 mg via INTRAVENOUS
  Filled 2017-05-12: qty 60

## 2017-05-12 MED FILL — PROCHLORPERAZINE 10 MG TAB: 10 | 7 days supply | Qty: 30 | Fill #0

## 2017-05-12 NOTE — Progress Notes (Signed)
Pt wanted blood drawn in arm instead of portacath. This nurse helped pt apply numbing cream to port for later access in infusion room. Porsche Cates LPN

## 2017-05-12 NOTE — Progress Notes (Addendum)
[place]  Telephone:([phone] Fax:([phone]) [phone]  Clinic Follow up Note   Patient Care Team: Patient, No Pcp Per as PCP - General ([place]) [name], MD as Consulting Physician (General Surgery) [name], MD as Consulting Physician (Hematology) [name], NP as Nurse Practitioner (Nurse Practitioner) 05/12/2017  CHIEF COMPLAINT: Follow up left breast cancer, triple negative  SUMMARY OF ONCOLOGIC HISTORY:   Cancer of overlapping sites of left breast ([place])   04/15/2017 Mammogram    IMPRESSION: 1. Highly suspicious irregular mass within the left breast at the 4 o'clock axis, 8 cm from the nipple, measuring 5.2 cm, corresponding to the area of clinical concern. Ultrasound-guided biopsy is recommended. 2. Additional suspicious irregular mass within the left breast at the 2 o'clock axis, 8 cm from the nipple, measuring 1.6 cm, corresponding to the mammographic finding. Ultrasound-guided biopsy is recommended. 3. Additional suspicious mass within the left breast at the 2 o'clock axis, 10 cm from the nipple, axillary tail region, measuring 3 cm, suspected lymph node completely replaced by tumor. Ultrasound-guided biopsy is recommended. 4. No evidence of malignancy within the right breast.       04/15/2017 Breast US    Left breast: Targeted ultrasound is performed, showing an irregular mass in the left breast at the 4 o'clock axis, 8 cm from the nipple, measuring 5.2 x 4.3 x 4.3 cm, with internal vascularity, corresponding to the mammographic finding and palpable lump.  There is an additional irregular hypoechoic mass in the left breast at the 2 o'clock axis, 8 cm from the nipple, measuring 1.6 x 1.3 x 1.3 cm, with internal vascularity, corresponding to the additional mass seen within the outer left breast on mammogram, suspected satellite mass.  Lastly, there is an oval circumscribed hypoechoic mass in the left breast at the 2  o'clock axis, 10 cm from the nipple, axillary tail region, with heterogeneous echotexture, measuring 3 x 2.3 x 2.8 cm, suspected lymph node completely replaced by tumor.  Remainder of the left axilla was evaluated with ultrasound showing no additional enlarged or morphologically abnormal lymph nodes.  IMPRESSION: 1. Highly suspicious irregular mass within the left breast at the 4 o'clock axis, 8 cm from the nipple, measuring 5.2 cm, corresponding to the area of clinical concern. Ultrasound-guided biopsy is recommended. 2. Additional suspicious irregular mass within the left breast at the 2 o'clock axis, 8 cm from the nipple, measuring 1.6 cm, corresponding to the mammographic finding. Ultrasound-guided biopsy is recommended. 3. Additional suspicious mass within the left breast at the 2 o'clock axis, 10 cm from the nipple, axillary tail region, measuring 3 cm, suspected lymph node completely replaced by tumor. Ultrasound-guided biopsy is recommended. 4. No evidence of malignancy within the right breast.       04/19/2017 Initial Biopsy    Diagnosis 1. Breast, left, needle core biopsy, 4:00 o'clock, ribbon clip - INVASIVE DUCTAL CARCINOMA. - SEE COMMENT. 2. Breast, left, needle core biopsy, 2:00 o'clock, coil clip - INVASIVE DUCTAL CARCINOMA. - SEE COMMENT. 3. Lymph node, needle/core biopsy, left axilla, spiral hydromark - DUCTAL CARCINOMA. - SEE COMMENT.  2. PROGNOSTIC INDICATORS Results: IMMUNOHISTOCHEMICAL AND MORPHOMETRIC ANALYSIS PERFORMED MANUALLY Estrogen Receptor: 0%, NEGATIVE Progesterone Receptor: 0%, NEGATIVE Proliferation Marker Ki67: 80%  2. FLUORESCENCE IN-SITU HYBRIDIZATION Results: HER2 - NEGATIVE RATIO OF HER2/CEP17 SIGNALS 1.53 AVERAGE HER2 COPY NUMBER PER CELL 2.75  Microscopic Comment 1. The carcinoma in the three specimens is morphologically similar and is grade III. Lymph nodal tissue is not definitively identified  in specimen #3. A breast  prognostic profile will be performed on part 2 and the results reported separately.      04/28/2017 Initial Diagnosis    Cancer of overlapping sites of left breast ([place])      04/30/2017 Breast MRI    IMPRESSION: 1. Biopsy-proven invasive carcinoma within the lower outer quadrant of the LEFT breast, 4 o'clock axis, at posterior depth, measuring 4.8 cm, with associated biopsy clip artifact. 2. Biopsy-proven invasive carcinoma within the upper-outer quadrant of the LEFT breast, 2 o'clock axis, at posterior depth, measuring 2.5 cm, with associated biopsy clip artifact. However, contiguous non mass enhancement along the posterior margin of this 2 o'clock mass and extending 3 cm anteriorly from the mass increases the overall measurement to 5.5 cm greatest dimension (AP). 3. Diffuse edema throughout the LEFT breast, with particularly prominent component of edema extending posteriorly to abut the anterior surface of the pectoralis muscle, and diffuse skin thickening throughout the left breast. This almost certainly indicates INFLAMMATORY BREAST CANCER. 4. Biopsy-proven metastatic lymph node within the LEFT axilla measures 4.1 cm. Two additional borderline prominent lymph nodes within the left axilla. No enlarged lymph nodes within the right axilla or internal mammary chain regions. 5. No evidence of malignancy within the RIGHT breast.  RECOMMENDATION: Per current treatment plan for patient's known left breast cancer (2 biopsy-proven sites) and metastatic lymph node in the left axilla.  BI-RADS CATEGORY  6: Known biopsy-proven malignancy.       04/30/2017 Echocardiogram    Study Conclusions  - Left ventricle: The cavity size was normal. Wall thickness was   normal. Systolic function was normal. The estimated ejection   fraction was in the range of 60% to 65%. Wall motion was normal;   there were no regional wall motion abnormalities. Left   ventricular diastolic function  parameters were normal. GLS:   -20.4%, RV S vel 12.1cm/s.      05/05/2017 Imaging    MRI BRAIN IMPRESSION: No intracranial or calvarial metastatic disease. Normal MRI of the brain.      05/07/2017 Imaging    CT CAP IMPRESSION: Two left breast masses and a grossly abnormal 3.9 cm lower left axillary lymph node, likely correspond to multifocal left breast cancer metastatic to the left axilla. Skin thickening of the left breast, raising concern for an inflammatory breast cancer.  1 cm hypoattenuated lesion in the posterior dome of the liver with internal attenuation slightly higher than water. This may represent a complicated liver cyst, hemangioma or a focus of metastatic disease.  No other findings to suggest distal metastatic disease.       05/07/2017 Imaging    BONE SCAN IMPRESSION: 1. No evidence of osseous metastases. 2. Abnormal uptake in the soft tissues of the left breast likely by the patient's known breast cancer.      CURRENT THERAPY: PENDING neoadjuvant AC q2 weeks x4 cycles then weekly carbo/taxol for 12 weeks beginning 05/12/17  INTERVAL HISTORY: Carrie Miller returns for follow up before cycle 1 AC as scheduled. Here with her brother who interprets for her. She has undergone staging work up, echo, PAC placement, genetics, and chemo education class since initial consult. PAC was mildly sore but healing well. Continues to have mild intermittent headaches controlled with ibuprofen PRN. Received zoladex injection 1/11, denies obvious side effects. Left breast is intermittently tender without much change in size or appearance of masses. Anxiety is controlled. She plans to cold cap for hair preservation, she has supplies.  REVIEW OF SYSTEMS:   Constitutional: Denies fatigue, fevers, chills or abnormal weight loss Eyes: Denies blurriness of vision Ears, nose, mouth, throat, and face: Denies mucositis or sore throat Respiratory: Denies cough, dyspnea or  wheezes Cardiovascular: Denies palpitation, chest discomfort or lower extremity swelling Gastrointestinal:  Denies nausea, vomiting, constipation, diarrhea, heartburn or change in bowel habits Skin: Denies abnormal skin rashes Lymphatics: Denies new lymphadenopathy or easy bruising Neurological:Denies numbness, tingling or new weaknesses (+) intermittent headaches, controlled with NSAID Behavioral/Psych: Mood is stable, no new changes  (+) anxiety controlled  Breasts: (+) occasional left breast tenderness All other systems were reviewed with the patient and are negative.  MEDICAL HISTORY:  Past Medical History:  Diagnosis Date  . Breast cancer ([place])   . Family history of cancer   . Genetic testing 04/29/2017   Common Cancers panel (47 genes) @ [facility] - No pathoagenic mutations detected    SURGICAL HISTORY: Past Surgical History:  Procedure Laterality Date  . IR FLUORO GUIDE PORT INSERTION RIGHT  05/03/2017  . IR US GUIDE VASC ACCESS RIGHT  05/03/2017    I have reviewed the social history and family history with the patient and they are unchanged from previous note.  ALLERGIES:  has No Known Allergies.  MEDICATIONS:  Current Outpatient Medications  Medication Sig Dispense Refill  . ibuprofen (ADVIL,MOTRIN) 200 MG tablet Take 2 tablets (400 mg total) by mouth every 6 (six) hours as needed. 30 tablet 0  . ALPRAZolam (XANAX) 0.25 MG tablet Take 1 tablet (0.25 mg total) by mouth at bedtime as needed for anxiety. (Patient not taking: Reported on 05/12/2017) 30 tablet 0  . lidocaine-prilocaine (EMLA) cream Apply to affected area once (Patient not taking: Reported on 05/12/2017) 30 g 3  . ondansetron (ZOFRAN) 8 MG tablet Take 1 tablet (8 mg total) by mouth 2 (two) times daily as needed. Start on the third day after chemotherapy. (Patient not taking: Reported on 05/12/2017) 30 tablet 1  . prochlorperazine (COMPAZINE) 10 MG tablet Take 1 tablet (10 mg total) by mouth every 6 (six) hours as  needed (Nausea or vomiting). (Patient not taking: Reported on 05/12/2017) 30 tablet 1   No current facility-administered medications for this visit.     PHYSICAL EXAMINATION: ECOG PERFORMANCE STATUS: 0 - Asymptomatic  Vitals:   05/12/17 1030  BP: 103/73  Pulse: 75  Resp: 18  Temp: 98.3 F (36.8 C)  SpO2: 100%   Filed Weights   05/12/17 1030  Weight: 203 lb (92.1 kg)    GENERAL:alert, no distress and comfortable SKIN: skin color, texture, turgor are normal, no rashes or significant lesions EYES: normal, Conjunctiva are pink and non-injected, sclera clear OROPHARYNX:no exudate, no erythema and lips, buccal mucosa, and tongue normal  NECK: supple, thyroid normal size, non-tender, without nodularity LYMPH:  no palpable cervical, supraclavicular, or inguinal lymphadenopathy  LUNGS: clear to auscultation bilaterally with normal breathing effort HEART: regular rate & rhythm and no murmurs and no lower extremity edema ABDOMEN:abdomen soft, non-tender and normal bowel sounds. No hepatomegaly  Musculoskeletal:no cyanosis of digits and no clubbing  NEURO: alert & oriented x 3 with fluent speech, no focal motor/sensory deficits BREASTS: inspection shows left breast to me slightly larger and edematous; no nipple discharge. No palpable mass in right breast or axilla that I could appreciate (+) left breast with mild edema and erythema, no nipple discharge. (+) palpable irregular 5 cm mass in lower outer quadrant (+) satellite mass in upper outer quadrant approx 2 cm (+) 3  cm palpable axillary LN. PAC without erythema   LABORATORY DATA:  I have reviewed the data as listed CBC Latest Ref Rng & Units 05/12/2017 05/03/2017  WBC 3.9 - 10.3 K/uL 7.0 6.8  Hemoglobin 12.0 - 15.0 g/dL - 13.1  Hematocrit 34.8 - 46.6 % 38.0 38.4  Platelets 145 - 400 K/uL 268 320     CMP Latest Ref Rng & Units 05/12/2017  Glucose 70 - 140 mg/dL 84  BUN 7 - 26 mg/dL 14  Sodium 136 - 145 mmol/L 141  Potassium 3.3 -  4.7 mmol/L 4.0  Chloride 98 - 109 mmol/L 108  CO2 22 - 29 mmol/L 25  Calcium 8.4 - 10.4 mg/dL 9.4  Total Protein 6.4 - 8.3 g/dL 8.1  Total Bilirubin 0.2 - 1.2 mg/dL 0.3  Alkaline Phos 40 - 150 U/L 86  AST 5 - 34 U/L 27  ALT 0 - 55 U/L 23    Diagnosis 1. Breast, left, needle core biopsy, 4:00 o'clock, ribbon clip - INVASIVE DUCTAL CARCINOMA. - SEE COMMENT. 2. Breast, left, needle core biopsy, 2:00 o'clock, coil clip - INVASIVE DUCTAL CARCINOMA. - SEE COMMENT. 3. Lymph node, needle/core biopsy, left axilla, spiral hydromark - DUCTAL CARCINOMA. - SEE COMMENT. Microscopic Comment 1. The carcinoma in the three specimens is morphologically similar and is grade III. Lymph nodal tissue is not definitively identified in specimen #3. A breast prognostic profile will be performed on part 2 and the results reported separately.  2. FLUORESCENCE IN-SITU HYBRIDIZATION Results: HER2 - NEGATIVE RATIO OF HER2/CEP17 SIGNALS 1.53 AVERAGE HER2 COPY NUMBER PER CELL 2.75  2. PROGNOSTIC INDICATORS Results: IMMUNOHISTOCHEMICAL AND MORPHOMETRIC ANALYSIS PERFORMED MANUALLY Estrogen Receptor: 0%, NEGATIVE Progesterone Receptor: 0%, NEGATIVE Proliferation Marker Ki67: 80% COMMENT: The negative hormone receptor study(ies) in this case has An internal positive control.   RADIOGRAPHIC STUDIES: I have personally reviewed the radiological images as listed and agreed with the findings in the report. No results found.   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 Headache, dizziness 3. Anxiety  4. Social support 5. Genetics 6. Fertility  planning 7. Irregular vaginal bleeding  Ms. Finnie appears well today. We reviewed breast MRI which confirms biopsy proven invasive carcinoma in lower outer and upper outer quadrants of the left breast and left axilla; there is diffuse edema and skin thickening throughout the left breast concerning for inflammatory breast cancer. No evidence of malignancy in the right breast. Bone scan negative for osseous metastasis. MRI negative for brain metastasis. CT CAP shows 1 cm hypoattenuated lesion in the posterior dome of the liver; it was reviewed in tumor conference and felt to represent a cyst rather than metastatic focus. Baseline echo normal with EF 60-65%. Genetic testing was negative for pathogenic mutation. After discussion between the patient and her mother they decided not to pursue fertility preservation through [facility]; she will continue monthly zoladex injections for the duration of chemotherapy to suppress ovarian function; first dose given 1/11. We discussed will monitor her clinical response to therapy with breast exam prior to each cycle; if masses are not felt to be shrinking will consider interim imaging. Also discussed may get repeat imaging of abdomen to monitor  liver lesion. She understands.  We reviewed material covered in chemo class including possible chemo side effects, anti-emetics, claritin for bone pain, nutrition, and signs/symptoms that would warrant call to [facility] such as fever, uncontrolled GI symptoms, etc. She is concerned about losing her hair and comes prepared for cold cap. VS stable CBC, Cmet normal; ready to proceed with cycle 1 AC. She will return on day 3 for neulasta injection and in 2 weeks for f/u and cycle 2.  PLAN -Proceed with cycle 1 AC, neulasta on day 3 -Return in 2 weeks for f/u and cycle 2  All questions were answered. The patient knows to call the clinic with any problems, questions or concerns. No barriers to learning was detected.    [name],  NP 05/12/17   Addendum  I have seen the patient, examined her. I agree with the assessment and and plan and have edited the notes.   Ms Rossitto is here to start neoadjuvant chemo.  We discussed her staging scan results, her skin was reviewed in the tumor board this morning, the cystic lesion in the liver felt to be likely benign, no further workup needed at this point.  No other evidence of metastatic disease on the scan.  She certainly has very locally advanced aggressive breast cancer.   Lab and echo reviewed.  We will start her first cycle new adjuvant therapy with Adriamycin and Cytoxan today.  We have started her on Zoladex injection 2 weeks ago, which hopefully will help to reduce the risk of chemo-induced infertility.  Unfortunately she was not able to do oocytes preservation.  She understands the risk of infertility after chemo.  She is not to have, no need to test her urine for pregnancy during her chemo.  Will see her back in 2 weeks, she knows to call if she experience side effects after chemo.  [name]  05/12/2017

## 2017-05-12 NOTE — Telephone Encounter (Signed)
Gave avs and calendar for January - march °

## 2017-05-12 NOTE — Progress Notes (Signed)
Urine pregnancy not required per Dr Burr Medico, Patient is not sexually active.  Via Kennyth Arnold, PharmD

## 2017-05-12 NOTE — Progress Notes (Signed)
Met with patient and family member whom emailed letter of support for [employer].  Patient approved for one-time $1000 [employer]. Gave a copy of the expense sheet as well as the outpatient pharmacy information. They have my card for any additional financial questions or concerns. Explained how expenses are handled and they may email or bring statements in. They verbalized understanding.

## 2017-05-12 NOTE — Patient Instructions (Addendum)
[facility] Discharge Instructions for Patients Receiving Chemotherapy  Today you received the following chemotherapy agents Adriamycin, and Cytoxan.   To help prevent nausea and vomiting after your treatment, we encourage you to take your nausea medication as directed.   If you develop nausea and vomiting that is not controlled by your nausea medication, call the clinic.   BELOW ARE SYMPTOMS THAT SHOULD BE REPORTED IMMEDIATELY:  *FEVER GREATER THAN 100.5 F  *CHILLS WITH OR WITHOUT FEVER  NAUSEA AND VOMITING THAT IS NOT CONTROLLED WITH YOUR NAUSEA MEDICATION  *UNUSUAL SHORTNESS OF BREATH  *UNUSUAL BRUISING OR BLEEDING  TENDERNESS IN MOUTH AND THROAT WITH OR WITHOUT PRESENCE OF ULCERS  *URINARY PROBLEMS  *BOWEL PROBLEMS  UNUSUAL RASH Items with * indicate a potential emergency and should be followed up as soon as possible.  Feel free to call the clinic should you have any questions or concerns. The clinic phone number is [phone].  Please show the [facility] at check-in to the Emergency Department and triage nurse.   Doxorubicin injection What is this medicine? DOXORUBICIN (dox oh ROO bi sin) is a chemotherapy drug. It is used to treat many kinds of cancer like leukemia, lymphoma, neuroblastoma, sarcoma, and Wilms' tumor. It is also used to treat bladder cancer, breast cancer, lung cancer, ovarian cancer, stomach cancer, and thyroid cancer. This medicine may be used for other purposes; ask your health care provider or pharmacist if you have questions. COMMON BRAND NAME(S): Adriamycin, Adriamycin PFS, Adriamycin RDF, Rubex What should I tell my health care provider before I take this medicine? They need to know if you have any of these conditions: -heart disease -history of low blood counts caused by a medicine -liver disease -recent or ongoing radiation therapy -an unusual or allergic reaction to doxorubicin, other chemotherapy agents, other  medicines, foods, dyes, or preservatives -pregnant or trying to get pregnant -breast-feeding How should I use this medicine? This drug is given as an infusion into a vein. It is administered in a hospital or clinic by a specially trained health care professional. If you have pain, swelling, burning or any unusual feeling around the site of your injection, tell your health care professional right away. Talk to your pediatrician regarding the use of this medicine in children. Special care may be needed. Overdosage: If you think you have taken too much of this medicine contact a poison control center or emergency room at once. NOTE: This medicine is only for you. Do not share this medicine with others. What if I miss a dose? It is important not to miss your dose. Call your doctor or health care professional if you are unable to keep an appointment. What may interact with this medicine? This medicine may interact with the following medications: -6-mercaptopurine -paclitaxel -phenytoin -St. John's Wort -trastuzumab -verapamil This list may not describe all possible interactions. Give your health care provider a list of all the medicines, herbs, non-prescription drugs, or dietary supplements you use. Also tell them if you smoke, drink alcohol, or use illegal drugs. Some items may interact with your medicine. What should I watch for while using this medicine? This drug may make you feel generally unwell. This is not uncommon, as chemotherapy can affect healthy cells as well as cancer cells. Report any side effects. Continue your course of treatment even though you feel ill unless your doctor tells you to stop. There is a maximum amount of this medicine you should receive throughout your life. The amount  depends on the medical condition being treated and your overall health. Your doctor will watch how much of this medicine you receive in your lifetime. Tell your doctor if you have taken this medicine  before. You may need blood work done while you are taking this medicine. Your urine may turn red for a few days after your dose. This is not blood. If your urine is dark or brown, call your doctor. In some cases, you may be given additional medicines to help with side effects. Follow all directions for their use. Call your doctor or health care professional for advice if you get a fever, chills or sore throat, or other symptoms of a cold or flu. Do not treat yourself. This drug decreases your body's ability to fight infections. Try to avoid being around people who are sick. This medicine may increase your risk to bruise or bleed. Call your doctor or health care professional if you notice any unusual bleeding. Talk to your doctor about your risk of cancer. You may be more at risk for certain types of cancers if you take this medicine. Do not become pregnant while taking this medicine or for 6 months after stopping it. Women should inform their doctor if they wish to become pregnant or think they might be pregnant. Men should not father a child while taking this medicine and for 6 months after stopping it. There is a potential for serious side effects to an unborn child. Talk to your health care professional or pharmacist for more information. Do not breast-feed an infant while taking this medicine. This medicine has caused ovarian failure in some women and reduced sperm counts in some men This medicine may interfere with the ability to have a child. Talk with your doctor or health care professional if you are concerned about your fertility. What side effects may I notice from receiving this medicine? Side effects that you should report to your doctor or health care professional as soon as possible: -allergic reactions like skin rash, itching or hives, swelling of the face, lips, or tongue -breathing problems -chest pain -fast or irregular heartbeat -low blood counts - this medicine may decrease the  number of white blood cells, red blood cells and platelets. You may be at increased risk for infections and bleeding. -pain, redness, or irritation at site where injected -signs of infection - fever or chills, cough, sore throat, pain or difficulty passing urine -signs of decreased platelets or bleeding - bruising, pinpoint red spots on the skin, black, tarry stools, blood in the urine -swelling of the ankles, feet, hands -tiredness -weakness Side effects that usually do not require medical attention (report to your doctor or health care professional if they continue or are bothersome): -diarrhea -hair loss -mouth sores -nail discoloration or damage -nausea -red colored urine -vomiting This list may not describe all possible side effects. Call your doctor for medical advice about side effects. You may report side effects to FDA at 1-800-FDA-1088. Where should I keep my medicine? This drug is given in a hospital or clinic and will not be stored at home. NOTE: This sheet is a summary. It may not cover all possible information. If you have questions about this medicine, talk to your doctor, pharmacist, or health care provider.  2018 Elsevier/Gold Standard (2015-06-03 11:28:51)  Cyclophosphamide injection What is this medicine? CYCLOPHOSPHAMIDE (sye kloe FOSS fa mide) is a chemotherapy drug. It slows the growth of cancer cells. This medicine is used to treat many types of cancer  leukemia, breast cancer, and ovarian cancer, to name a few. This medicine may be used for other purposes; ask your health care provider or pharmacist if you have questions. COMMON BRAND NAME(S): Cytoxan, Neosar What should I tell my health care provider before I take this medicine? They need to know if you have any of these conditions: -blood disorders -history of other chemotherapy -infection -kidney disease -liver disease -recent or ongoing radiation therapy -tumors in the bone marrow -an unusual or  allergic reaction to cyclophosphamide, other chemotherapy, other medicines, foods, dyes, or preservatives -pregnant or trying to get pregnant -breast-feeding How should I use this medicine? This drug is usually given as an injection into a vein or muscle or by infusion into a vein. It is administered in a hospital or clinic by a specially trained health care professional. Talk to your pediatrician regarding the use of this medicine in children. Special care may be needed. Overdosage: If you think you have taken too much of this medicine contact a poison control center or emergency room at once. NOTE: This medicine is only for you. Do not share this medicine with others. What if I miss a dose? It is important not to miss your dose. Call your doctor or health care professional if you are unable to keep an appointment. What may interact with this medicine? This medicine may interact with the following medications: -amiodarone -amphotericin B -azathioprine -certain antiviral medicines for HIV or AIDS such as protease inhibitors (e.g., indinavir, ritonavir) and zidovudine -certain blood pressure medications such as benazepril, captopril, enalapril, fosinopril, lisinopril, moexipril, monopril, perindopril, quinapril, ramipril, trandolapril -certain cancer medications such as anthracyclines (e.g., daunorubicin, doxorubicin), busulfan, cytarabine, paclitaxel, pentostatin, tamoxifen, trastuzumab -certain diuretics such as chlorothiazide, chlorthalidone, hydrochlorothiazide, indapamide, metolazone -certain medicines that treat or prevent blood clots like warfarin -certain muscle relaxants such as succinylcholine -cyclosporine -etanercept -indomethacin -medicines to increase blood counts like filgrastim, pegfilgrastim, sargramostim -medicines used as general anesthesia -metronidazole -natalizumab This list may not describe all possible interactions. Give your health care provider a list of all the  medicines, herbs, non-prescription drugs, or dietary supplements you use. Also tell them if you smoke, drink alcohol, or use illegal drugs. Some items may interact with your medicine. What should I watch for while using this medicine? Visit your doctor for checks on your progress. This drug may make you feel generally unwell. This is not uncommon, as chemotherapy can affect healthy cells as well as cancer cells. Report any side effects. Continue your course of treatment even though you feel ill unless your doctor tells you to stop. Drink water or other fluids as directed. Urinate often, even at night. In some cases, you may be given additional medicines to help with side effects. Follow all directions for their use. Call your doctor or health care professional for advice if you get a fever, chills or sore throat, or other symptoms of a cold or flu. Do not treat yourself. This drug decreases your body's ability to fight infections. Try to avoid being around people who are sick. This medicine may increase your risk to bruise or bleed. Call your doctor or health care professional if you notice any unusual bleeding. Be careful brushing and flossing your teeth or using a toothpick because you may get an infection or bleed more easily. If you have any dental work done, tell your dentist you are receiving this medicine. You may get drowsy or dizzy. Do not drive, use machinery, or do anything that needs mental alertness until   you know how this medicine affects you. Do not become pregnant while taking this medicine or for 1 year after stopping it. Women should inform their doctor if they wish to become pregnant or think they might be pregnant. Men should not father a child while taking this medicine and for 4 months after stopping it. There is a potential for serious side effects to an unborn child. Talk to your health care professional or pharmacist for more information. Do not breast-feed an infant while taking  this medicine. This medicine may interfere with the ability to have a child. This medicine has caused ovarian failure in some women. This medicine has caused reduced sperm counts in some men. You should talk with your doctor or health care professional if you are concerned about your fertility. If you are going to have surgery, tell your doctor or health care professional that you have taken this medicine. What side effects may I notice from receiving this medicine? Side effects that you should report to your doctor or health care professional as soon as possible: -allergic reactions like skin rash, itching or hives, swelling of the face, lips, or tongue -low blood counts - this medicine may decrease the number of white blood cells, red blood cells and platelets. You may be at increased risk for infections and bleeding. -signs of infection - fever or chills, cough, sore throat, pain or difficulty passing urine -signs of decreased platelets or bleeding - bruising, pinpoint red spots on the skin, black, tarry stools, blood in the urine -signs of decreased red blood cells - unusually weak or tired, fainting spells, lightheadedness -breathing problems -dark urine -dizziness -palpitations -swelling of the ankles, feet, hands -trouble passing urine or change in the amount of urine -weight gain -yellowing of the eyes or skin Side effects that usually do not require medical attention (report to your doctor or health care professional if they continue or are bothersome): -changes in nail or skin color -hair loss -missed menstrual periods -mouth sores -nausea, vomiting This list may not describe all possible side effects. Call your doctor for medical advice about side effects. You may report side effects to FDA at 1-800-FDA-1088. Where should I keep my medicine? This drug is given in a hospital or clinic and will not be stored at home. NOTE: This sheet is a summary. It may not cover all possible  information. If you have questions about this medicine, talk to your doctor, pharmacist, or health care provider.  2018 Elsevier/Gold Standard (2012-02-19 16:22:58)  

## 2017-05-12 NOTE — Progress Notes (Signed)
[facility] Spiritual Care Note  Referred by Regan Rakers Burton/NP and [name] for spiritual, emotional, and social support. Met Carrie Miller and her brother Carrie Miller [facility]?) in infusion, introducing Spiritual Care as part of the support team for their family. Per brother, they are Muslim and attend Friday prayers at a local mosque but are not involved at a community level; primary support comes from family. They speak very highly of [facility] support and look forward to pt's attending the [facility] next week. They anticipate that having more of a peer group will be helpful for pt.  Normalized feelings, brought prayer shawl/blessing blanket as tangible symbol of support, provided brochure to describe faith-based/interfaith chaplain services in more detail. Following for support, but please also page if immediate needs arise. Thank you.   [place], [place], [facility] Pager [phone] Voicemail [phone]

## 2017-05-13 ENCOUNTER — Telehealth: Payer: Self-pay | Admitting: General Practice

## 2017-05-13 LAB — CYTOLOGY - PAP: DIAGNOSIS: NEGATIVE

## 2017-05-13 LAB — CANCER ANTIGEN 27.29: CA 27.29: 20.9 U/mL (ref 0.0–38.6)

## 2017-05-13 NOTE — Telephone Encounter (Signed)
-----   Message from Lavonia Drafts, MD sent at 05/13/2017  9:07 AM EST ----- Regarding: abnormal PAP- Arabic interpreter needed Please call pt  She needs a repeat PAP in 1 year. She had some atypical cells but, given her history will wait 1 year to repeat.  She speaks Arabic so you will need an interpreter.  clh-S

## 2017-05-13 NOTE — Telephone Encounter (Signed)
[place] interpreter [phone] with Dari language used. Called patient at listed number and phone number is for the sister in law. She states the patient doesn't have a reliable working number but she will try to conference the call. Patient was unable. Told her to have the patient call us back for results. She states she will

## 2017-05-14 ENCOUNTER — Inpatient Hospital Stay: Payer: Medicaid Other

## 2017-05-14 DIAGNOSIS — C50812 Malignant neoplasm of overlapping sites of left female breast: Secondary | ICD-10-CM

## 2017-05-14 DIAGNOSIS — Z5111 Encounter for antineoplastic chemotherapy: Secondary | ICD-10-CM | POA: Diagnosis not present

## 2017-05-14 MED ORDER — PEGFILGRASTIM INJECTION 6 MG/0.6ML ~~LOC~~
6.0000 mg | PREFILLED_SYRINGE | Freq: Once | SUBCUTANEOUS | Status: AC
  Administered 2017-05-14: 6 mg via SUBCUTANEOUS

## 2017-05-14 NOTE — Telephone Encounter (Signed)
Patient called and left message stating she is returning our phone call. Called patient with pacific interpreter [phone], no answer- left message to call us back for results

## 2017-05-14 NOTE — Patient Instructions (Signed)
Pegfilgrastim injection What is this medicine? PEGFILGRASTIM (PEG fil gra stim) is a long-acting granulocyte colony-stimulating factor that stimulates the growth of neutrophils, a type of white blood cell important in the body's fight against infection. It is used to reduce the incidence of fever and infection in patients with certain types of cancer who are receiving chemotherapy that affects the bone marrow, and to increase survival after being exposed to high doses of radiation. This medicine may be used for other purposes; ask your health care provider or pharmacist if you have questions. COMMON BRAND NAME(S): Neulasta What should I tell my health care provider before I take this medicine? They need to know if you have any of these conditions: -kidney disease -latex allergy -ongoing radiation therapy -sickle cell disease -skin reactions to acrylic adhesives (On-Body Injector only) -an unusual or allergic reaction to pegfilgrastim, filgrastim, other medicines, foods, dyes, or preservatives -pregnant or trying to get pregnant -breast-feeding How should I use this medicine? This medicine is for injection under the skin. If you get this medicine at home, you will be taught how to prepare and give the pre-filled syringe or how to use the On-body Injector. Refer to the patient Instructions for Use for detailed instructions. Use exactly as directed. Tell your healthcare provider immediately if you suspect that the On-body Injector may not have performed as intended or if you suspect the use of the On-body Injector resulted in a missed or partial dose. It is important that you put your used needles and syringes in a special sharps container. Do not put them in a trash can. If you do not have a sharps container, call your pharmacist or healthcare provider to get one. Talk to your pediatrician regarding the use of this medicine in children. While this drug may be prescribed for selected conditions,  precautions do apply. Overdosage: If you think you have taken too much of this medicine contact a poison control center or emergency room at once. NOTE: This medicine is only for you. Do not share this medicine with others. What if I miss a dose? It is important not to miss your dose. Call your doctor or health care professional if you miss your dose. If you miss a dose due to an On-body Injector failure or leakage, a new dose should be administered as soon as possible using a single prefilled syringe for manual use. What may interact with this medicine? Interactions have not been studied. Give your health care provider a list of all the medicines, herbs, non-prescription drugs, or dietary supplements you use. Also tell them if you smoke, drink alcohol, or use illegal drugs. Some items may interact with your medicine. This list may not describe all possible interactions. Give your health care provider a list of all the medicines, herbs, non-prescription drugs, or dietary supplements you use. Also tell them if you smoke, drink alcohol, or use illegal drugs. Some items may interact with your medicine. What should I watch for while using this medicine? You may need blood work done while you are taking this medicine. If you are going to need a MRI, CT scan, or other procedure, tell your doctor that you are using this medicine (On-Body Injector only). What side effects may I notice from receiving this medicine? Side effects that you should report to your doctor or health care professional as soon as possible: -allergic reactions like skin rash, itching or hives, swelling of the face, lips, or tongue -dizziness -fever -pain, redness, or irritation at site   where injected -pinpoint red spots on the skin -red or dark-brown urine -shortness of breath or breathing problems -stomach or side pain, or pain at the shoulder -swelling -tiredness -trouble passing urine or change in the amount of urine Side  effects that usually do not require medical attention (report to your doctor or health care professional if they continue or are bothersome): -bone pain -muscle pain This list may not describe all possible side effects. Call your doctor for medical advice about side effects. You may report side effects to FDA at 1-800-FDA-1088. Where should I keep my medicine? Keep out of the reach of children. Store pre-filled syringes in a refrigerator between 2 and 8 degrees C (36 and 46 degrees F). Do not freeze. Keep in carton to protect from light. Throw away this medicine if it is left out of the refrigerator for more than 48 hours. Throw away any unused medicine after the expiration date. NOTE: This sheet is a summary. It may not cover all possible information. If you have questions about this medicine, talk to your doctor, pharmacist, or health care provider.  2018 Elsevier/Gold Standard (2016-04-02 12:58:03)  

## 2017-05-17 ENCOUNTER — Encounter: Payer: Self-pay | Admitting: Pharmacy Technician

## 2017-05-17 NOTE — Progress Notes (Signed)
The patient is approved for drug assistance by [employer] for [employer].  The enrollment period is from 05/17/17 - 05/17/18 and is based on Self Pay.  The first DOS covered by assistance is 05/13/17.

## 2017-05-17 NOTE — Telephone Encounter (Signed)
Called patient with pac[place] interpreter [phone] and informed patient of results & recommended follow up. Patient verbalized understanding & asked if she still needed the appt on the 5th since we gave her results. Told patient no and we can cancel that appt for her. Patient verbalized understanding and had no questions

## 2017-05-21 ENCOUNTER — Encounter: Payer: Self-pay | Admitting: Pharmacy Technician

## 2017-05-21 NOTE — Progress Notes (Signed)
The patient is approved for drug assistance by [employer] for Zoladex 3.6 mg. Enrollment is from 05/14/17 - 05/14/18 and is based on Self Pay. Drug replacement begins DOS 04/30/17.

## 2017-05-21 NOTE — Progress Notes (Signed)
The patient is approved for drug assistance by [employer] for Neulasta. Enrollment is from 05/20/17 - 05/20/18 and is based on Self Pay.  Drug replacement starts DOS 05/15/17.

## 2017-05-24 ENCOUNTER — Ambulatory Visit: Payer: Self-pay | Admitting: Obstetrics & Gynecology

## 2017-05-26 NOTE — Progress Notes (Signed)
[name]  [phone] [phone]) [phone]  Clinic Follow up Note   Patient Care Team: Patient, [name], MD as Consulting Physician (General Surgery) [name], MD as Consulting Physician (Hematology) [name], NP as Nurse Practitioner (Nurse Practitioner) 05/27/2017  CHIEF COMPLAINT: Follow up left breast cancer, triple negative   SUMMARY OF ONCOLOGIC HISTORY:   Cancer of overlapping sites of left breast ([place])   04/15/2017 Mammogram    IMPRESSION: 1. Highly suspicious irregular mass within the left breast at the 4 o'clock axis, 8 cm from the nipple, measuring 5.2 cm, corresponding to the area of clinical concern. Ultrasound-guided biopsy is recommended. 2. Additional suspicious irregular mass within the left breast at the 2 o'clock axis, 8 cm from the nipple, measuring 1.6 cm, corresponding to the mammographic finding. Ultrasound-guided biopsy is recommended. 3. Additional suspicious mass within the left breast at the 2 o'clock axis, 10 cm from the nipple, axillary tail region, measuring 3 cm, suspected lymph node completely replaced by tumor. Ultrasound-guided biopsy is recommended. 4. No evidence of malignancy within the right breast.       04/15/2017 Breast US    Left breast: Targeted ultrasound is performed, showing an irregular mass in the left breast at the 4 o'clock axis, 8 cm from the nipple, measuring 5.2 x 4.3 x 4.3 cm, with internal vascularity, corresponding to the mammographic finding and palpable lump.  There is an additional irregular hypoechoic mass in the left breast at the 2 o'clock axis, 8 cm from the nipple, measuring 1.6 x 1.3 x 1.3 cm, with internal vascularity, corresponding to the additional mass seen within the outer left breast on mammogram, suspected satellite mass.  Lastly, there is an oval circumscribed hypoechoic mass in the left breast at the 2  o'clock axis, 10 cm from the nipple, axillary tail region, with heterogeneous echotexture, measuring 3 x 2.3 x 2.8 cm, suspected lymph node completely replaced by tumor.  Remainder of the left axilla was evaluated with ultrasound showing no additional enlarged or morphologically abnormal lymph nodes.  IMPRESSION: 1. Highly suspicious irregular mass within the left breast at the 4 o'clock axis, 8 cm from the nipple, measuring 5.2 cm, corresponding to the area of clinical concern. Ultrasound-guided biopsy is recommended. 2. Additional suspicious irregular mass within the left breast at the 2 o'clock axis, 8 cm from the nipple, measuring 1.6 cm, corresponding to the mammographic finding. Ultrasound-guided biopsy is recommended. 3. Additional suspicious mass within the left breast at the 2 o'clock axis, 10 cm from the nipple, axillary tail region, measuring 3 cm, suspected lymph node completely replaced by tumor. Ultrasound-guided biopsy is recommended. 4. No evidence of malignancy within the right breast.       04/19/2017 Initial Biopsy    Diagnosis 1. Breast, left, needle core biopsy, 4:00 o'clock, ribbon clip - INVASIVE DUCTAL CARCINOMA. - SEE COMMENT. 2. Breast, left, needle core biopsy, 2:00 o'clock, coil clip - INVASIVE DUCTAL CARCINOMA. - SEE COMMENT. 3. Lymph node, needle/core biopsy, left axilla, spiral hydromark - DUCTAL CARCINOMA. - SEE COMMENT.  2. PROGNOSTIC INDICATORS Results: IMMUNOHISTOCHEMICAL AND MORPHOMETRIC ANALYSIS PERFORMED MANUALLY Estrogen Receptor: 0%, NEGATIVE Progesterone Receptor: 0%, NEGATIVE Proliferation Marker Ki67: 80%  2. FLUORESCENCE IN-SITU HYBRIDIZATION Results: HER2 - NEGATIVE RATIO OF HER2/CEP17 SIGNALS 1.53 AVERAGE HER2 COPY NUMBER PER CELL 2.75  Microscopic Comment 1. The carcinoma in the three specimens is morphologically similar and is grade III. Lymph nodal tissue is not definitively  identified in specimen #3. A breast  prognostic profile will be performed on part 2 and the results reported separately.      04/28/2017 Initial Diagnosis    Cancer of overlapping sites of left breast ([place])      04/30/2017 Breast MRI    IMPRESSION: 1. Biopsy-proven invasive carcinoma within the lower outer quadrant of the LEFT breast, 4 o'clock axis, at posterior depth, measuring 4.8 cm, with associated biopsy clip artifact. 2. Biopsy-proven invasive carcinoma within the upper-outer quadrant of the LEFT breast, 2 o'clock axis, at posterior depth, measuring 2.5 cm, with associated biopsy clip artifact. However, contiguous non mass enhancement along the posterior margin of this 2 o'clock mass and extending 3 cm anteriorly from the mass increases the overall measurement to 5.5 cm greatest dimension (AP). 3. Diffuse edema throughout the LEFT breast, with particularly prominent component of edema extending posteriorly to abut the anterior surface of the pectoralis muscle, and diffuse skin thickening throughout the left breast. This almost certainly indicates INFLAMMATORY BREAST CANCER. 4. Biopsy-proven metastatic lymph node within the LEFT axilla measures 4.1 cm. Two additional borderline prominent lymph nodes within the left axilla. No enlarged lymph nodes within the right axilla or internal mammary chain regions. 5. No evidence of malignancy within the RIGHT breast.  RECOMMENDATION: Per current treatment plan for patient's known left breast cancer (2 biopsy-proven sites) and metastatic lymph node in the left axilla.  BI-RADS CATEGORY  6: Known biopsy-proven malignancy.       04/30/2017 Echocardiogram    Study Conclusions  - Left ventricle: The cavity size was normal. Wall thickness was   normal. Systolic function was normal. The estimated ejection   fraction was in the range of 60% to 65%. Wall motion was normal;   there were no regional wall motion abnormalities. Left   ventricular diastolic function  parameters were normal. GLS:   -20.4%, RV S vel 12.1cm/s.      05/05/2017 Imaging    MRI BRAIN IMPRESSION: No intracranial or calvarial metastatic disease. Normal MRI of the brain.      05/07/2017 Imaging    CT CAP IMPRESSION: Two left breast masses and a grossly abnormal 3.9 cm lower left axillary lymph node, likely correspond to multifocal left breast cancer metastatic to the left axilla. Skin thickening of the left breast, raising concern for an inflammatory breast cancer.  1 cm hypoattenuated lesion in the posterior dome of the liver with internal attenuation slightly higher than water. This may represent a complicated liver cyst, hemangioma or a focus of metastatic disease.  No other findings to suggest distal metastatic disease.       05/07/2017 Imaging    BONE SCAN IMPRESSION: 1. No evidence of osseous metastases. 2. Abnormal uptake in the soft tissues of the left breast likely by the patient's known breast cancer.       05/11/2017 Genetic Testing    Common Cancers panel (47 genes) @ [facility] - No pathoagenic mutations detected Five Variants of Uncertain Significance were detected:  APC c.2438A>G (p.Asn813Ser)  BARD1 c.782T>C (p.Leu261Pro) MSH3 c.1655C>T (p.Thr552Ile)  MSH6 c.2108T>C (p.Met703Thr)  NTHL1 c.470G>C (p.Arg157Pro)   Genes Analyzed: 47 genes on [facility]'s Common Cancers panel (APC, ATM, AXIN2, BARD1, BMPR1A, BRCA1, BRCA2, BRIP1, CDH1, CDK4, CDKN2A, CHEK2, CTNNA1, DICER1, EPCAM, GREM1, HOXB13, KIT, MEN1, MLH1, MSH2, MSH3, MSH6, MUTYH, NBN, NF1, NTHL1, PALB2, PDGFRA, PMS2, POLD1, POLE, PTEN, RAD50, RAD51C, RAD51D, SDHA, SDHB, SDHC, SDHD, SMAD4, SMARCA4, STK11, TP53, TSC1, TSC2, VHL).      CURRENT THERAPY: PENDING  neoadjuvant AC q2 weeks x4 cycles then weekly carbo/taxol for 12 weeks beginning 05/12/17  INTERVAL HISTORY: Ms. Carrie Miller returns for follow up as scheduled prior to cycle 2 AC. Had very mild fatigue and felt slightly feverish in first few days  but did not take her temp. No chills. She had 3-4 days of nausea without vomiting after treatment, she took Compazine on days 1 through 3 then began alternating with Zofran.  This resolved with medication.  No emesis, constipation, or diarrhea. Taste was decreased but able to eat and drink. No bone pain with neulasta, she did not take claritin. No numbness or tingling.   REVIEW OF SYSTEMS:   Constitutional: Denies fevers, chills or abnormal weight loss (+) mild fatigue 3-4 days (+) slightly feverish without documented temp (+) decreased taste  Eyes: Denies blurriness of vision Ears, nose, mouth, throat, and face: Denies mucositis or sore throat Respiratory: Denies cough, dyspnea or wheezes Cardiovascular: Denies palpitation, chest discomfort or lower extremity swelling Gastrointestinal:  Denies vomiting, constipation, diarrhea, heartburn or change in bowel habits (+) nausea x3-4 days, resolved with alternating zofran and compazine  Skin: Denies abnormal skin rashes Lymphatics: Denies new lymphadenopathy or easy bruising Neurological:Denies numbness, tingling or new weaknesses Behavioral/Psych: Mood is stable, no new changes  MSK: denies bone pain with neulasta All other systems were reviewed with the patient and are negative.  MEDICAL HISTORY:  Past Medical History:  Diagnosis Date  . Breast cancer ([place])   . Family history of cancer   . Genetic testing 04/29/2017   Common Cancers panel (47 genes) @ [facility] - No pathoagenic mutations detected    SURGICAL HISTORY: Past Surgical History:  Procedure Laterality Date  . IR FLUORO GUIDE PORT INSERTION RIGHT  05/03/2017  . IR US GUIDE VASC ACCESS RIGHT  05/03/2017    I have reviewed the social history and family history with the patient and they are unchanged from previous note.  ALLERGIES:  is allergic to heparin.  MEDICATIONS:  Current Outpatient Medications  Medication Sig Dispense Refill  . ALPRAZolam (XANAX) 0.25 MG tablet Take 1  tablet (0.25 mg total) by mouth at bedtime as needed for anxiety. 30 tablet 0  . ibuprofen (ADVIL,MOTRIN) 200 MG tablet Take 2 tablets (400 mg total) by mouth every 6 (six) hours as needed. 30 tablet 0  . lidocaine-prilocaine (EMLA) cream Apply to affected area once 30 g 3  . ondansetron (ZOFRAN) 8 MG tablet Take 1 tablet (8 mg total) by mouth 2 (two) times daily as needed. Start on the third day after chemotherapy. 30 tablet 1  . prochlorperazine (COMPAZINE) 10 MG tablet Take 1 tablet (10 mg total) by mouth every 6 (six) hours as needed (Nausea or vomiting). 30 tablet 1   No current facility-administered medications for this visit.    Facility-Administered Medications Ordered in Other Visits  Medication Dose Route Frequency Provider Last Rate Last Dose  . anticoagulant sodium citrate solution 5 mL  5 mL Intravenous Once [name], MD      . goserelin (ZOLADEX) injection 3.6 mg  3.6 mg Subcutaneous Once Cira Rue K, NP      . sodium chloride flush (NS) 0.9 % injection 10 mL  10 mL Intracatheter PRN [name], MD        PHYSICAL EXAMINATION: ECOG PERFORMANCE STATUS: 1 - Symptomatic but completely ambulatory  Vitals:   05/27/17 1059  BP: 116/88  Pulse: 99  Resp: 18  Temp: 98.2 F (36.8 C)  SpO2: 100%  Filed Weights   05/27/17 1059  Weight: 203 lb 1.6 oz (92.1 kg)    GENERAL:alert, no distress and comfortable SKIN: skin color, texture, turgor are normal, no rashes or significant lesions EYES: normal, Conjunctiva are pink and non-injected, sclera clear OROPHARYNX:no exudate, no erythema and lips, buccal mucosa, and tongue normal  NECK: supple, thyroid normal size, non-tender, without nodularity LYMPH:  no palpable cervical, supraclavicular, or inguinal lymphadenopathy  LUNGS: clear to auscultation bilaterally with normal breathing effort HEART: regular rate & rhythm and no murmurs and no lower extremity edema ABDOMEN:abdomen soft, non-tender and normal bowel  sounds Musculoskeletal:no cyanosis of digits and no clubbing  NEURO: alert & oriented x 3 with fluent speech, no focal motor/sensory deficits BREASTS: inspection of breasts reveals improved fullness and edema in the left breast, no nipple discharge (+) palpable irregular 5 cm mass in lower outer quadrant appears softer but unchanged in size. (+) satellite mass in upper outer quadrant that was previously 2 cm is not easily palpable on today's exam (+) not able to palpate left axillary LN that was previously 3 cm PAC without erythema  LABORATORY DATA:  I have reviewed the data as listed CBC Latest Ref Rng & Units 05/27/2017 05/12/2017 05/03/2017  WBC 3.9 - 10.3 K/uL 13.7(H) 7.0 6.8  Hemoglobin 12.0 - 15.0 g/dL - - 13.1  Hematocrit 34.8 - 46.6 % 37.2 38.0 38.4  Platelets 145 - 400 K/uL 248 268 320     CMP Latest Ref Rng & Units 05/27/2017 05/12/2017  Glucose 70 - 140 mg/dL 107 84  BUN 7 - 26 mg/dL 9 14  Creatinine 0.60 - 1.10 mg/dL 0.80 0.82  Sodium 136 - 145 mmol/L 140 141  Potassium 3.5 - 5.1 mmol/L 4.0 4.0  Chloride 98 - 109 mmol/L 105 108  CO2 22 - 29 mmol/L 27 25  Calcium 8.4 - 10.4 mg/dL 9.5 9.4  Total Protein 6.4 - 8.3 g/dL 7.8 8.1  Total Bilirubin 0.2 - 1.2 mg/dL <0.2(L) 0.3  Alkaline Phos 40 - 150 U/L 136 86  AST 5 - 34 U/L 42(H) 27  ALT 0 - 55 U/L 78(H) 23   Diagnosis 1. Breast, left, needle core biopsy, 4:00 o'clock, ribbon clip - INVASIVE DUCTAL CARCINOMA. - SEE COMMENT. 2. Breast, left, needle core biopsy, 2:00 o'clock, coil clip - INVASIVE DUCTAL CARCINOMA. - SEE COMMENT. 3. Lymph node, needle/core biopsy, left axilla, spiral hydromark - DUCTAL CARCINOMA. - SEE COMMENT. Microscopic Comment 1. The carcinoma in the three specimens is morphologically similar and is grade III. Lymph nodal tissue is not definitively identified in specimen #3. A breast prognostic profile will be performed on part 2 and the results reported separately.  2. FLUORESCENCE IN-SITU  HYBRIDIZATION Results: HER2 - NEGATIVE RATIO OF HER2/CEP17 SIGNALS 1.53 AVERAGE HER2 COPY NUMBER PER CELL 2.75  2. PROGNOSTIC INDICATORS Results: IMMUNOHISTOCHEMICAL AND MORPHOMETRIC ANALYSIS PERFORMED MANUALLY Estrogen Receptor: 0%, NEGATIVE Progesterone Receptor: 0%, NEGATIVE Proliferation Marker Ki67: 80% COMMENT: The negative hormone receptor study(ies) in this case has An internal positive control.     RADIOGRAPHIC STUDIES: I have personally reviewed the radiological images as listed and agreed with the findings in the report. No results found.   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 Kidney appears stable today. She tolerated cycle 1 AC with neulasta well overall with minimal side effects. Palpable breast masses appear to have had some clinical response to therapy thus far. Will monitor closely. VS and weight stable; labs reviewed, leukocytosis likely from neulasta. She has mild transaminitis today, nor present previously; likely related to chemotherapy. Will monitor closely. Labs adequate to proceed with cycle 2 AC. PLan to complete 4 cycles neoadjuvant AC then proceed with weekly carbo/taxol x12 weekly. Will return in 2 weeks for f/u and cycle 2 with neulasta.   2. Anxiety -she appears less anxious today; she is able to cold-cap which makes her happy. She has not lost hair yet, she is pleased.  3. Transaminitis -She has mild transaminitis, AST 42, ALT 78; not present previously -She has 1 cm hypoattenuated lesion in the posterior dome of the liver, felt to be a cyst.  -likely related to chemotherapy -WIll  monitor closely.   PLAN:  Labs reviewed, proceed with cycle 2 AC with neulasta -return in 2 weeks for f/u with Dr. Burr Medico and next cycle  All questions were answered. The patient knows to call the clinic with any problems, questions or concerns. No barriers to learning was detected.     [name], NP 05/27/17

## 2017-05-27 ENCOUNTER — Inpatient Hospital Stay: Payer: Medicaid Other

## 2017-05-27 ENCOUNTER — Encounter: Payer: Self-pay | Admitting: General Practice

## 2017-05-27 ENCOUNTER — Telehealth: Payer: Self-pay | Admitting: Hematology

## 2017-05-27 ENCOUNTER — Encounter: Payer: Self-pay | Admitting: Nurse Practitioner

## 2017-05-27 ENCOUNTER — Inpatient Hospital Stay: Payer: Medicaid Other | Attending: Nurse Practitioner | Admitting: Nurse Practitioner

## 2017-05-27 VITALS — BP 116/88 | HR 99 | Temp 98.2°F | Resp 18 | Ht 64.96 in | Wt 203.1 lb

## 2017-05-27 DIAGNOSIS — Z5189 Encounter for other specified aftercare: Secondary | ICD-10-CM | POA: Insufficient documentation

## 2017-05-27 DIAGNOSIS — C773 Secondary and unspecified malignant neoplasm of axilla and upper limb lymph nodes: Secondary | ICD-10-CM | POA: Diagnosis present

## 2017-05-27 DIAGNOSIS — R740 Nonspecific elevation of levels of transaminase and lactic acid dehydrogenase [LDH]: Secondary | ICD-10-CM | POA: Diagnosis not present

## 2017-05-27 DIAGNOSIS — C50812 Malignant neoplasm of overlapping sites of left female breast: Secondary | ICD-10-CM

## 2017-05-27 DIAGNOSIS — R7401 Elevation of levels of liver transaminase levels: Secondary | ICD-10-CM

## 2017-05-27 DIAGNOSIS — Z95828 Presence of other vascular implants and grafts: Secondary | ICD-10-CM

## 2017-05-27 DIAGNOSIS — Z171 Estrogen receptor negative status [ER-]: Secondary | ICD-10-CM

## 2017-05-27 DIAGNOSIS — Z5111 Encounter for antineoplastic chemotherapy: Secondary | ICD-10-CM | POA: Insufficient documentation

## 2017-05-27 DIAGNOSIS — F419 Anxiety disorder, unspecified: Secondary | ICD-10-CM | POA: Diagnosis not present

## 2017-05-27 LAB — CMP (CANCER CENTER ONLY)
ALBUMIN: 3.9 g/dL (ref 3.5–5.0)
ALT: 78 U/L — ABNORMAL HIGH (ref 0–55)
ANION GAP: 8 (ref 3–11)
AST: 42 U/L — ABNORMAL HIGH (ref 5–34)
Alkaline Phosphatase: 136 U/L (ref 40–150)
BUN: 9 mg/dL (ref 7–26)
CHLORIDE: 105 mmol/L (ref 98–109)
CO2: 27 mmol/L (ref 22–29)
Calcium: 9.5 mg/dL (ref 8.4–10.4)
Creatinine: 0.8 mg/dL (ref 0.60–1.10)
GFR, Est AFR Am: >60 mL/min (ref >60)
GLUCOSE: 107 mg/dL (ref 70–140)
POTASSIUM: 4 mmol/L (ref 3.5–5.1)
Sodium: 140 mmol/L (ref 136–145)
Total Protein: 7.8 g/dL (ref 6.4–8.3)

## 2017-05-27 LAB — CBC WITH DIFFERENTIAL (CANCER CENTER ONLY)
Basophils Absolute: 0.1 10*3/uL (ref 0.0–0.1)
Basophils Relative: 1 %
EOS PCT: 0 %
Eosinophils Absolute: 0 10*3/uL (ref 0.0–0.5)
HEMATOCRIT: 37.2 % (ref 34.8–46.6)
Hemoglobin: 12.4 g/dL (ref 11.6–15.9)
LYMPHS ABS: 1.7 10*3/uL (ref 0.9–3.3)
LYMPHS PCT: 12 %
MCH: 29.5 pg (ref 25.1–34.0)
MCHC: 33.3 g/dL (ref 31.5–36.0)
MCV: 88.5 fL (ref 79.5–101.0)
Monocytes Absolute: 0.8 10*3/uL (ref 0.1–0.9)
Monocytes Relative: 6 %
NEUTROS ABS: 11.1 10*3/uL — AB (ref 1.5–6.5)
Neutrophils Relative %: 81 %
Platelet Count: 248 10*3/uL (ref 145–400)
RBC: 4.2 MIL/uL (ref 3.70–5.45)
RDW: 12.3 % (ref 11.2–14.5)
WBC: 13.7 10*3/uL — AB (ref 3.9–10.3)

## 2017-05-27 MED ORDER — DOXORUBICIN HCL CHEMO IV INJECTION 2 MG/ML
60.0000 mg/m2 | Freq: Once | INTRAVENOUS | Status: AC
  Administered 2017-05-27: 120 mg via INTRAVENOUS
  Filled 2017-05-27: qty 60

## 2017-05-27 MED ORDER — SODIUM CHLORIDE 0.9 % IV SOLN
600.0000 mg/m2 | Freq: Once | INTRAVENOUS | Status: AC
  Administered 2017-05-27: 1200 mg via INTRAVENOUS
  Filled 2017-05-27: qty 60

## 2017-05-27 MED ORDER — PALONOSETRON HCL INJECTION 0.25 MG/5ML
0.2500 mg | Freq: Once | INTRAVENOUS | Status: AC
  Administered 2017-05-27: 0.25 mg via INTRAVENOUS

## 2017-05-27 MED ORDER — SODIUM CHLORIDE 0.9 % IV SOLN
Freq: Once | INTRAVENOUS | Status: AC
  Administered 2017-05-27: 13:00:00 via INTRAVENOUS

## 2017-05-27 MED ORDER — SODIUM CHLORIDE 0.9% FLUSH
10.0000 mL | Freq: Once | INTRAVENOUS | Status: DC
  Filled 2017-05-27: qty 10

## 2017-05-27 MED ORDER — PALONOSETRON HCL INJECTION 0.25 MG/5ML
INTRAVENOUS | Status: AC
Start: 2017-05-27 — End: 2017-05-27
  Filled 2017-05-27: qty 5

## 2017-05-27 MED ORDER — SODIUM CHLORIDE 0.9 % IV SOLN
Freq: Once | INTRAVENOUS | Status: AC
  Administered 2017-05-27: 14:00:00 via INTRAVENOUS
  Filled 2017-05-27: qty 5

## 2017-05-27 MED ORDER — ANTICOAGULANT SODIUM CITRATE 4% (200MG/5ML) IV SOLN
5.0000 mL | Freq: Once | Status: AC
  Administered 2017-05-27: 5 mL via INTRAVENOUS
  Filled 2017-05-27: qty 5

## 2017-05-27 MED ORDER — GOSERELIN ACETATE 3.6 MG ~~LOC~~ IMPL
3.6000 mg | DRUG_IMPLANT | Freq: Once | SUBCUTANEOUS | Status: AC
  Administered 2017-05-27: 3.6 mg via SUBCUTANEOUS

## 2017-05-27 MED ORDER — GOSERELIN ACETATE 3.6 MG ~~LOC~~ IMPL
DRUG_IMPLANT | SUBCUTANEOUS | Status: AC
  Filled 2017-05-27: qty 3.6

## 2017-05-27 MED ORDER — SODIUM CHLORIDE 0.9% FLUSH
10.0000 mL | INTRAVENOUS | Status: DC | PRN
  Administered 2017-05-27: 10 mL
  Filled 2017-05-27: qty 10

## 2017-05-27 NOTE — Telephone Encounter (Signed)
Gave avs and calendar for march °

## 2017-05-27 NOTE — Progress Notes (Signed)
Patient wanted blood drawn from arm due to not applying lidocaine cream on port.  Labs peripherally drawn by lab 4. Porsche Cates LPN

## 2017-05-27 NOTE — Patient Instructions (Signed)
[facility] Discharge Instructions for Patients Receiving Chemotherapy  Today you received the following chemotherapy agents Adriamycin, Cytoxan, and Zoladex.   To help prevent nausea and vomiting after your treatment, we encourage you to take your nausea medication as directed.   If you develop nausea and vomiting that is not controlled by your nausea medication, call the clinic.   BELOW ARE SYMPTOMS THAT SHOULD BE REPORTED IMMEDIATELY:  *FEVER GREATER THAN 100.5 F  *CHILLS WITH OR WITHOUT FEVER  NAUSEA AND VOMITING THAT IS NOT CONTROLLED WITH YOUR NAUSEA MEDICATION  *UNUSUAL SHORTNESS OF BREATH  *UNUSUAL BRUISING OR BLEEDING  TENDERNESS IN MOUTH AND THROAT WITH OR WITHOUT PRESENCE OF ULCERS  *URINARY PROBLEMS  *BOWEL PROBLEMS  UNUSUAL RASH Items with * indicate a potential emergency and should be followed up as soon as possible.  Feel free to call the clinic should you have any questions or concerns. The clinic phone number is [phone].  Please show the [place] at check-in to the Emergency Department and triage nurse.

## 2017-05-28 NOTE — Progress Notes (Signed)
[facility] Spiritual Care Note  Visited briefly with Carrie Miller and her brother in infusion to continue building rapport and to offer encouragement. Brother particularly verbalized appreciation for support. Deferred to [name], who was visiting when I arrived and is a primary [facility]-based supporter for [employer]. Following for support, but please also page if needs arise. Thank you.   [place], [place], [facility] Pager [phone] Voicemail [phone]

## 2017-05-29 ENCOUNTER — Inpatient Hospital Stay: Payer: Medicaid Other

## 2017-05-29 VITALS — BP 114/66 | HR 100 | Temp 97.9°F | Resp 16

## 2017-05-29 DIAGNOSIS — C50812 Malignant neoplasm of overlapping sites of left female breast: Secondary | ICD-10-CM

## 2017-05-29 DIAGNOSIS — Z5111 Encounter for antineoplastic chemotherapy: Secondary | ICD-10-CM | POA: Diagnosis not present

## 2017-05-29 MED ORDER — PEGFILGRASTIM INJECTION 6 MG/0.6ML ~~LOC~~
PREFILLED_SYRINGE | SUBCUTANEOUS | Status: AC
  Filled 2017-05-29: qty 0.6

## 2017-05-29 MED ORDER — PEGFILGRASTIM INJECTION 6 MG/0.6ML ~~LOC~~
6.0000 mg | PREFILLED_SYRINGE | Freq: Once | SUBCUTANEOUS | Status: AC
  Administered 2017-05-29: 6 mg via SUBCUTANEOUS

## 2017-05-29 NOTE — Patient Instructions (Signed)
Pegfilgrastim injection What is this medicine? PEGFILGRASTIM (PEG fil gra stim) is a long-acting granulocyte colony-stimulating factor that stimulates the growth of neutrophils, a type of white blood cell important in the body's fight against infection. It is used to reduce the incidence of fever and infection in patients with certain types of cancer who are receiving chemotherapy that affects the bone marrow, and to increase survival after being exposed to high doses of radiation. This medicine may be used for other purposes; ask your health care provider or pharmacist if you have questions. COMMON BRAND NAME(S): Neulasta What should I tell my health care provider before I take this medicine? They need to know if you have any of these conditions: -kidney disease -latex allergy -ongoing radiation therapy -sickle cell disease -skin reactions to acrylic adhesives (On-Body Injector only) -an unusual or allergic reaction to pegfilgrastim, filgrastim, other medicines, foods, dyes, or preservatives -pregnant or trying to get pregnant -breast-feeding How should I use this medicine? This medicine is for injection under the skin. If you get this medicine at home, you will be taught how to prepare and give the pre-filled syringe or how to use the On-body Injector. Refer to the patient Instructions for Use for detailed instructions. Use exactly as directed. Tell your healthcare provider immediately if you suspect that the On-body Injector may not have performed as intended or if you suspect the use of the On-body Injector resulted in a missed or partial dose. It is important that you put your used needles and syringes in a special sharps container. Do not put them in a trash can. If you do not have a sharps container, call your pharmacist or healthcare provider to get one. Talk to your pediatrician regarding the use of this medicine in children. While this drug may be prescribed for selected conditions,  precautions do apply. Overdosage: If you think you have taken too much of this medicine contact a poison control center or emergency room at once. NOTE: This medicine is only for you. Do not share this medicine with others. What if I miss a dose? It is important not to miss your dose. Call your doctor or health care professional if you miss your dose. If you miss a dose due to an On-body Injector failure or leakage, a new dose should be administered as soon as possible using a single prefilled syringe for manual use. What may interact with this medicine? Interactions have not been studied. Give your health care provider a list of all the medicines, herbs, non-prescription drugs, or dietary supplements you use. Also tell them if you smoke, drink alcohol, or use illegal drugs. Some items may interact with your medicine. This list may not describe all possible interactions. Give your health care provider a list of all the medicines, herbs, non-prescription drugs, or dietary supplements you use. Also tell them if you smoke, drink alcohol, or use illegal drugs. Some items may interact with your medicine. What should I watch for while using this medicine? You may need blood work done while you are taking this medicine. If you are going to need a MRI, CT scan, or other procedure, tell your doctor that you are using this medicine (On-Body Injector only). What side effects may I notice from receiving this medicine? Side effects that you should report to your doctor or health care professional as soon as possible: -allergic reactions like skin rash, itching or hives, swelling of the face, lips, or tongue -dizziness -fever -pain, redness, or irritation at site   where injected -pinpoint red spots on the skin -red or dark-brown urine -shortness of breath or breathing problems -stomach or side pain, or pain at the shoulder -swelling -tiredness -trouble passing urine or change in the amount of urine Side  effects that usually do not require medical attention (report to your doctor or health care professional if they continue or are bothersome): -bone pain -muscle pain This list may not describe all possible side effects. Call your doctor for medical advice about side effects. You may report side effects to FDA at 1-800-FDA-1088. Where should I keep my medicine? Keep out of the reach of children. Store pre-filled syringes in a refrigerator between 2 and 8 degrees C (36 and 46 degrees F). Do not freeze. Keep in carton to protect from light. Throw away this medicine if it is left out of the refrigerator for more than 48 hours. Throw away any unused medicine after the expiration date. NOTE: This sheet is a summary. It may not cover all possible information. If you have questions about this medicine, talk to your doctor, pharmacist, or health care provider.  2018 Elsevier/Gold Standard (2016-04-02 12:58:03)  

## 2017-06-08 NOTE — Progress Notes (Signed)
[place]  [phone] [phone]) [phone]  Clinic Follow Up Note   Patient Care Team: Patient, No Pcp Per as PCP - General ([place]) [name], MD as Consulting Physician (General Surgery) [name], MD as Consulting Physician (Hematology) [name], NP as Nurse Practitioner (Nurse Practitioner)   Date of Service:  06/10/2017  CHIEF COMPLAINTS:  Follow up left breast cancer, triple negative     Cancer of overlapping sites of left breast ([name])   04/15/2017 Mammogram    IMPRESSION: 1. Highly suspicious irregular mass within the left breast at the 4 o'clock axis, 8 cm from the nipple, measuring 5.2 cm, corresponding to the area of clinical concern. Ultrasound-guided biopsy is recommended. 2. Additional suspicious irregular mass within the left breast at the 2 o'clock axis, 8 cm from the nipple, measuring 1.6 cm, corresponding to the mammographic finding. Ultrasound-guided biopsy is recommended. 3. Additional suspicious mass within the left breast at the 2 o'clock axis, 10 cm from the nipple, axillary tail region, measuring 3 cm, suspected lymph node completely replaced by tumor. Ultrasound-guided biopsy is recommended. 4. No evidence of malignancy within the right breast.       04/15/2017 Breast US    Left breast: Targeted ultrasound is performed, showing an irregular mass in the left breast at the 4 o'clock axis, 8 cm from the nipple, measuring 5.2 x 4.3 x 4.3 cm, with internal vascularity, corresponding to the mammographic finding and palpable lump.  There is an additional irregular hypoechoic mass in the left breast at the 2 o'clock axis, 8 cm from the nipple, measuring 1.6 x 1.3 x 1.3 cm, with internal vascularity, corresponding to the additional mass seen within the outer left breast on mammogram, suspected satellite mass.  Lastly, there is an oval circumscribed hypoechoic mass in the left breast at the 2 o'clock  axis, 10 cm from the nipple, axillary tail region, with heterogeneous echotexture, measuring 3 x 2.3 x 2.8 cm, suspected lymph node completely replaced by tumor.  Remainder of the left axilla was evaluated with ultrasound showing no additional enlarged or morphologically abnormal lymph nodes.  IMPRESSION: 1. Highly suspicious irregular mass within the left breast at the 4 o'clock axis, 8 cm from the nipple, measuring 5.2 cm, corresponding to the area of clinical concern. Ultrasound-guided biopsy is recommended. 2. Additional suspicious irregular mass within the left breast at the 2 o'clock axis, 8 cm from the nipple, measuring 1.6 cm, corresponding to the mammographic finding. Ultrasound-guided biopsy is recommended. 3. Additional suspicious mass within the left breast at the 2 o'clock axis, 10 cm from the nipple, axillary tail region, measuring 3 cm, suspected lymph node completely replaced by tumor. Ultrasound-guided biopsy is recommended. 4. No evidence of malignancy within the right breast.       04/19/2017 Initial Biopsy    Diagnosis 1. Breast, left, needle core biopsy, 4:00 o'clock, ribbon clip - INVASIVE DUCTAL CARCINOMA. - SEE COMMENT. 2. Breast, left, needle core biopsy, 2:00 o'clock, coil clip - INVASIVE DUCTAL CARCINOMA. - SEE COMMENT. 3. Lymph node, needle/core biopsy, left axilla, spiral hydromark - DUCTAL CARCINOMA. - SEE COMMENT.  2. PROGNOSTIC INDICATORS Results: IMMUNOHISTOCHEMICAL AND MORPHOMETRIC ANALYSIS PERFORMED MANUALLY Estrogen Receptor: 0%, NEGATIVE Progesterone Receptor: 0%, NEGATIVE Proliferation Marker Ki67: 80%  2. FLUORESCENCE IN-SITU HYBRIDIZATION Results: HER2 - NEGATIVE RATIO OF HER2/CEP17 SIGNALS 1.53 AVERAGE HER2 COPY NUMBER PER CELL 2.75  Microscopic Comment 1. The carcinoma in the three specimens is morphologically similar and is grade III. Lymph nodal tissue  is not definitively identified in specimen #3. A breast prognostic  profile will be performed on part 2 and the results reported separately.      04/28/2017 Initial Diagnosis    Cancer of overlapping sites of left breast ([name])      04/30/2017 Breast MRI    IMPRESSION: 1. Biopsy-proven invasive carcinoma within the lower outer quadrant of the LEFT breast, 4 o'clock axis, at posterior depth, measuring 4.8 cm, with associated biopsy clip artifact. 2. Biopsy-proven invasive carcinoma within the upper-outer quadrant of the LEFT breast, 2 o'clock axis, at posterior depth, measuring 2.5 cm, with associated biopsy clip artifact. However, contiguous non mass enhancement along the posterior margin of this 2 o'clock mass and extending 3 cm anteriorly from the mass increases the overall measurement to 5.5 cm greatest dimension (AP). 3. Diffuse edema throughout the LEFT breast, with particularly prominent component of edema extending posteriorly to abut the anterior surface of the pectoralis muscle, and diffuse skin thickening throughout the left breast. This almost certainly indicates INFLAMMATORY BREAST CANCER. 4. Biopsy-proven metastatic lymph node within the LEFT axilla measures 4.1 cm. Two additional borderline prominent lymph nodes within the left axilla. No enlarged lymph nodes within the right axilla or internal mammary chain regions. 5. No evidence of malignancy within the RIGHT breast.  RECOMMENDATION: Per current treatment plan for patient's known left breast cancer (2 biopsy-proven sites) and metastatic lymph node in the left axilla.  BI-RADS CATEGORY  6: Known biopsy-proven malignancy.       04/30/2017 Echocardiogram    Study Conclusions  - Left ventricle: The cavity size was normal. Wall thickness was   normal. Systolic function was normal. The estimated ejection   fraction was in the range of 60% to 65%. Wall motion was normal;   there were no regional wall motion abnormalities. Left   ventricular diastolic function parameters were  normal. GLS:   -20.4%, RV S vel 12.1cm/s.      05/05/2017 Imaging    MRI BRAIN IMPRESSION: No intracranial or calvarial metastatic disease. Normal MRI of the brain.      05/07/2017 Imaging    CT CAP IMPRESSION: Two left breast masses and a grossly abnormal 3.9 cm lower left axillary lymph node, likely correspond to multifocal left breast cancer metastatic to the left axilla. Skin thickening of the left breast, raising concern for an inflammatory breast cancer.  1 cm hypoattenuated lesion in the posterior dome of the liver with internal attenuation slightly higher than water. This may represent a complicated liver cyst, hemangioma or a focus of metastatic disease.  No other findings to suggest distal metastatic disease.       05/07/2017 Imaging    BONE SCAN IMPRESSION: 1. No evidence of osseous metastases. 2. Abnormal uptake in the soft tissues of the left breast likely by the patient's known breast cancer.       05/11/2017 Genetic Testing    Common Cancers panel (47 genes) @ [facility] - No pathoagenic mutations detected Five Variants of Uncertain Significance were detected:  APC c.2438A>G (p.Asn813Ser)  BARD1 c.782T>C (p.Leu261Pro) MSH3 c.1655C>T (p.Thr552Ile)  MSH6 c.2108T>C (p.Met703Thr)  NTHL1 c.470G>C (p.Arg157Pro)   Genes Analyzed: 47 genes on [facility]'s Common Cancers panel (APC, ATM, AXIN2, BARD1, BMPR1A, BRCA1, BRCA2, BRIP1, CDH1, CDK4, CDKN2A, CHEK2, CTNNA1, DICER1, EPCAM, GREM1, HOXB13, KIT, MEN1, MLH1, MSH2, MSH3, MSH6, MUTYH, NBN, NF1, NTHL1, PALB2, PDGFRA, PMS2, POLD1, POLE, PTEN, RAD50, RAD51C, RAD51D, SDHA, SDHB, SDHC, SDHD, SMAD4, SMARCA4, STK11, TP53, TSC1, TSC2, VHL).  05/12/2017 -  Chemotherapy    1. neoadjuvant AC q2 weeks x4 cycles then weekly carbo/taxol for 12 weeks beginning 05/12/17.  2. Monthly zoladex injections starting 04/30/17 for the duration of chemotherapy.        HISTORY OF PRESENTING ILLNESS: 04/28/17 Ardath Sax 22 y.o.  female is here because of newly diagnosed left breast cancer. She was referred by Dr. Barry Dienes. She presents with her brother and his wife; she speaks minimal [place], her family interpreted for her as she refused interpreter services. She initially palpated a left breast mass 6 weeks ago that was painful with breast swelling and felt to be enlarging. Has not had prior mammogram. Denies nipple inversion, discharge, or skin dimpling. She went to [facility] urgent care and was then referred to the breast center for imaging and diagnostics. Diagnostic mammogram showed a dominant mass in the left breast at the 4 o'clock axis, 8 cm from the nipple, measuring 5.2 x 4.3 x 4.3 cm; a satellite mass in the left breast at the 2 o'clock axis, 8 cm from the nipple, measuring 1.6 x 1.3 x 1.3 cm; and a circumscribed hypoechoic mass in the left breast at the 2 o'clock axis, 10 cm from the nipple, axillary tail measuring 3 x 2.3 x 2.8 cm, suspected lymph node completely replaced by tumor.  Ultrasound-guided biopsy of all 3 masses were positive for invasive ductal carcinoma, ER/PR negative, HER-2 negative, grade 3.    In addition she reports 1 month history of frequent daily headaches with associated dizziness and occasional left eye pain. Pain often wakes her up from sleep, has tried ibuprofen with some relief. Pain usually at the crown of her head, average 5/10 - 9/10 on pain scale. Not sensitive to light or sound. Denies fall. Reports fatigue for 1 week, denies weight loss, decreased appetite, abdominal pain. Over last 2 months she has irregular vaginal bleeding, bleeding 20 days out of the last month. Recently had abnormal PAP smear, colposcopy is pending. She feels very anxious about her diagnosis.  She has no significant past medical history. Her father has prostate cancer diagnosed at age 39. Paternal aunt had "abdominal cancer." a paternal uncle also had prostate cancer diagnosed age 53. Negative family history of breast or GYN  cancer. She lives with her family in a home with her parents, brother, and his brother's wife. She does not work or drive. She lived in [place] and moved to [place] 9 months ago. She has permanent resident card, no health insurance. Has been getting assistance with BCCCP program through [employer].  GYN HISTORY  Menarchal: 7 LMP: currently menstruating  Contraceptive: none HRT: none GP: G0    CURRENT THERAPY:  1. Neoadjuvant AC q2 weeks x4 cycles then weekly carbo/taxol for 12 weeks beginning 05/12/17.  2. Monthly zoladex injections starting 04/30/17 for the duration of chemotherapy.   INTERVAL HISTORY:  Mykira Hofmeister is here for a follow up. She presents to the clinic today accompanied by her brother who helps translate for her. She notes the chemo is going well so far.    On review of symptoms, pt notes she has started to lose hair from chemo. She notes nasuea that lasted for 3-4 days. She takes antiemetics which helps. She notes mild constipation. She is still abel to be active.     MEDICAL HISTORY:  Past Medical History:  Diagnosis Date  . Breast cancer ([place])   . Family history of cancer   . Genetic testing 04/29/2017  Common Cancers panel (47 genes) @ [facility] - No pathoagenic mutations detected    SURGICAL HISTORY: Past Surgical History:  Procedure Laterality Date  . IR FLUORO GUIDE PORT INSERTION RIGHT  05/03/2017  . IR US GUIDE VASC ACCESS RIGHT  05/03/2017    SOCIAL HISTORY: Social History   Socioeconomic History  . Marital status: Single    Spouse name: Not on file  . Number of children: Not on file  . Years of education: Not on file  . Highest education level: Not on file  Social Needs  . Financial resource strain: Not on file  . Food insecurity - worry: Not on file  . Food insecurity - inability: Not on file  . Transportation needs - medical: Not on file  . Transportation needs - non-medical: Not on file  Occupational History  .  Occupation: not working   Tobacco Use  . Smoking status: Never Smoker  . Smokeless tobacco: Never Used  Substance and Sexual Activity  . Alcohol use: No    Frequency: Never  . Drug use: No  . Sexual activity: No    Birth control/protection: None  Other Topics Concern  . Not on file  Social History Narrative  . Not on file    FAMILY HISTORY: Family History  Problem Relation Age of Onset  . Hyperlipidemia Mother   . Hypertension Mother   . Prostate cancer Father 66       currently 72  . Stomach cancer Paternal Aunt 68       deceased 23s  . Prostate cancer Paternal Uncle 78       deceased 45    ALLERGIES:  is allergic to heparin.  MEDICATIONS:  Current Outpatient Medications  Medication Sig Dispense Refill  . ibuprofen (ADVIL,MOTRIN) 200 MG tablet Take 2 tablets (400 mg total) by mouth every 6 (six) hours as needed. 30 tablet 0  . lidocaine-prilocaine (EMLA) cream Apply to affected area once 30 g 3  . ondansetron (ZOFRAN) 8 MG tablet Take 1 tablet (8 mg total) by mouth 2 (two) times daily as needed. Start on the third day after chemotherapy. 30 tablet 1  . prochlorperazine (COMPAZINE) 10 MG tablet Take 1 tablet (10 mg total) by mouth every 6 (six) hours as needed (Nausea or vomiting). 30 tablet 1  . ALPRAZolam (XANAX) 0.25 MG tablet Take 1 tablet (0.25 mg total) by mouth at bedtime as needed for anxiety. (Patient not taking: Reported on 06/10/2017) 30 tablet 0   No current facility-administered medications for this visit.     REVIEW OF SYSTEMS:   Constitutional: Denies fevers, chills or abnormal night sweats (+) hair loss Eyes: Denies blurriness of vision, double vision or watery eyes Ears, nose, mouth, throat, and face: Denies mucositis or sore throat Respiratory: Denies cough, dyspnea or wheezes Cardiovascular: Denies palpitation, chest discomfort or lower extremity swelling Gastrointestinal:  Denies heartburn or change in bowel habits (+) mild nausea and constipation,  manageable Skin: Denies abnormal skin rashes Lymphatics: Denies new lymphadenopathy or easy bruising Neurological:Denies numbness, tingling or new weaknesses Behavioral/Psych: Mood is stable, no new changes  All other systems were reviewed with the patient and are negative.  PHYSICAL EXAMINATION: ECOG PERFORMANCE STATUS: 1 - Symptomatic but completely ambulatory  Vitals:   06/10/17 1012  BP: 127/72  Pulse: 77  Resp: 18  Temp: 98.2 F (36.8 C)  SpO2: 100%   Filed Weights   06/10/17 1012  Weight: 202 lb 9.6 oz (91.9 kg)    GENERAL:alert, no  distress and comfortable SKIN: skin color, texture, turgor are normal, no rashes or significant lesions EYES: normal, conjunctiva are pink and non-injected, sclera clear OROPHARYNX:no exudate, no erythema and lips, buccal mucosa, and tongue normal  NECK: supple, thyroid normal size, non-tender, without nodularity LYMPH:  no palpable lymphadenopathy in the cervical, axillary or inguinal LUNGS: clear to auscultation and percussion with normal breathing effort HEART: regular rate & rhythm and no murmurs and no lower extremity edema ABDOMEN:abdomen soft, non-tender and normal bowel sounds Musculoskeletal:no cyanosis of digits and no clubbing  PSYCH: alert & oriented x 3 with fluent speech NEURO: no focal motor/sensory deficits BREAST: (+) Left Lower Quadrant mass in 1.5 cm now, much smaller. Left Outer Qudrant mass is not palpable anymore. (+) No palpable lymph nodes. Right breast exam benign     LABORATORY DATA:  I have reviewed the data as listed CBC Latest Ref Rng & Units 06/10/2017 05/27/2017 05/12/2017  WBC 3.9 - 10.3 K/uL 17.4(H) 13.7(H) 7.0  Hemoglobin 12.0 - 15.0 g/dL - - -  Hematocrit 34.8 - 46.6 % 32.7(L) 37.2 38.0  Platelets 145 - 400 K/uL 159 248 268    CMP Latest Ref Rng & Units 06/10/2017 05/27/2017 05/12/2017  Glucose 70 - 140 mg/dL 95 107 84  BUN 7 - 26 mg/dL 7 9 14   Creatinine 0.60 - 1.10 mg/dL 0.73 0.80 0.82  Sodium 136 -  145 mmol/L 140 140 141  Potassium 3.5 - 5.1 mmol/L 3.9 4.0 4.0  Chloride 98 - 109 mmol/L 105 105 108  CO2 22 - 29 mmol/L 26 27 25   Calcium 8.4 - 10.4 mg/dL 9.5 9.5 9.4  Total Protein 6.4 - 8.3 g/dL 7.1 7.8 8.1  Total Bilirubin 0.2 - 1.2 mg/dL 0.2 <0.2(L) 0.3  Alkaline Phos 40 - 150 U/L 130 136 86  AST 5 - 34 U/L 18 42(H) 27  ALT 0 - 55 U/L 20 78(H) 23   PATHOLOGY  Diagnosis 05/10/17 1. Cervix, biopsy, 11 o'clock - ATYPICAL SQUAMOUS METAPLASIA ASSOCIATED WITH INFLAMMATION. - SEE COMMENT. 2. Endocervix, curettage - DETACHED FRAGMENTS OF SQUAMOUS MUCOSA WITH SLIGHT ATYPIA. - BENIGN ENDOCERVICAL MUCOSA. - SEE COMMENT. Microscopic Comment 1. The sections show multiple fragments of mostly metaplastic squamous mucosa displaying prominent chronic and acute inflammation to a lesser extent. This is associated with epithelial atypia in the form of nuclear enlargement, and small nucleoli. The inflammatory process obscures the epithelium which is somewhat difficult to evaluate. A p16 stain was performed and is negative. Overall, definitive or diagnostic squamous intraepithelial lesion is not appreciated in this setting, and the changes may represent a florid reactive state. Nonetheless, clinical correlation and follow up is strongly recommended. 2. The sections show multiple detached fragments of squamous mucosa, some of which display slight atypia. This is admixed with multiple fragments of benign endocervical mucosa. A p16 stain was performed and is negative. The overall changes are not specific or diagnostic of a squamous intraepithelial lesion. Clinical correlation and follow up is recommended. (BNS:ah 05/12/17)  GYNECOLOGIC CYTOLOGY REPORT Adequacy Reason 05/10/17 Satisfactory for evaluation, endocervical/transformation zone component PRESENT. Diagnosis NEGATIVE FOR INTRAEPITHELIAL LESIONS OR MALIGNANCY. BENIGN REACTIVE/REPARATIVE CHANGES.    Diagnosis 04/19/17 1. Breast, left, needle  core biopsy, 4:00 o'clock, ribbon clip - INVASIVE DUCTAL CARCINOMA. - SEE COMMENT. 2. Breast, left, needle core biopsy, 2:00 o'clock, coil clip - INVASIVE DUCTAL CARCINOMA. - SEE COMMENT. 3. Lymph node, needle/core biopsy, left axilla, spiral hydromark - DUCTAL CARCINOMA. - SEE COMMENT. Microscopic Comment 1. The carcinoma in the three  specimens is morphologically similar and is grade III. Lymph nodal tissue is not definitively identified in specimen #3. A breast prognostic profile will be performed on part 2 and the results reported separately. 2. FLUORESCENCE IN-SITU HYBRIDIZATION Results: HER2 - NEGATIVE RATIO OF HER2/CEP17 SIGNALS 1.53 AVERAGE HER2 COPY NUMBER PER CELL 2.75 2. PROGNOSTIC INDICATORS Results: IMMUNOHISTOCHEMICAL AND MORPHOMETRIC ANALYSIS PERFORMED MANUALLY Estrogen Receptor: 0%, NEGATIVE Progesterone Receptor: 0%, NEGATIVE Proliferation Marker Ki67: 80% COMMENT: The negative hormone receptor study(ies) in this case has An internal positive control.    RADIOGRAPHIC STUDIES: I have personally reviewed the radiological images as listed and agreed with the findings in the report. No results found.  ASSESSMENT & PLAN:  Everlyn Farabaugh a 21 y.o. 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  -She presented with a palpable left breast mass, measures 5.2 cm on ultrasound, with positive lymph node. Breast tumor biopsy showed triple negative breast ductal carcinoma. -Her staging CT and bone scan was negative for distant metastasis. -She  was seen by Dr. Barry Dienes on 1/7 who feels she is potentially a candidate for breast conserving surgery but more likely will need at least left mastectomy if not bilateral mastectomies due to her young age, tumor size, and risk of recurrence.   -Given her clinical stage IIIC, triple negative disease, I recommended neoadjuvant regimen consisting of AC every 2 weeks for 4 cycles+ carboplatin/Taxol weekly for 12 cycles, followed by surgery then radiation. Side effects were discussed in great detailw ith her. She is agreed to proceed.  --Baseline echo on 04/30/17 normal with EF 60-65%.  -Genetic testing was negative for pathogenic mutation. 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  She will continue monthly zoladex injections for the duration of chemotherapy to suppress ovarian function; first dose given 1/11. -She started [facility] on 05/12/17 with neulast injection on day 3. She has been tolerating well  -Labs reviewed and adequate to proceed with cycle 3 AC this week.  -But breast exam has showed significant reduced size of left breast masses, indicating good response to chemotherapy. -Next Zoldex injection on 3/9  -F/u in 2 weeks    2.  Headaches, dizziness -She has 1 month history of daily headaches with associated dizziness, pain ranges from 5/10 - 9/10 on the crown of her head, ibuprofen helps some; she can continue this or take tylenol PRN, neuro exam was unremarkable -Given the aggressive nature of triple negative disease, I obtained brain MRI on 05/05/17 which showed no evidence of brain mets.  -Will monitor while on chemo -improved   3.  Anxiety -She has considerable anxiety about her new diagnosis and the pending work up and treatment plan  -I previously prescribed low dose xanax for her to take PRN for anxiety especially at night if she has difficulty sleeping  4.  Social issues -She lives with her family and has their support; does not work  or drive -She does not currently have Scientist, product/process development, has been using Counselling psychologist -We have included breast Engineer, site, social work, Clinical biochemist, and financial advocate to provide more assistance for her throughout this process  5.  Genetics  -Due to her young age and family history of prostate cancer, she has been referred to genetics -05/11/17 Genetic testing was negative for pathogenic mutation  6. Fertility planning  -We discussed the risk of infertility secondary to chemotherapy, she wishes to preserve fertility  -I have referred her to [facility] fertility specialist, initial appointment was on 1/11  -After discussion between the patient and her mother they decided not to pursue fertility preservation through [facility] -We have started her on Zoladex injection on 04/30/17, which hopefully will help to reduce the risk of chemo-induced infertility.   7. Irregular vaginal bleeding -She has 2 month history of irregular vaginal bleeding, she reports she'll have bleeding every 5 days; has been bleeding for 20 days of the last month -04/15/18 PAP with atypical squamous cells.  -She underwent colposcopy on 05/10/17 with D&C and results were negative for malignancy but with abnormal pap smear of cervix.  -Will continue to follow up with GYN.    PLAN:   -Labs adequate to proceed with chemo cycle 3 AC today -Lab, flush, f/u and AC on 3/7 -Zoladex injection 3/7 -I wrote a letter for her care-giver, sister-in-law     No orders of the defined types were placed in this encounter.   All questions were answered. The patient knows to call the clinic with any problems, questions or concerns. I spent 20 minutes counseling the patient face to face. The total time spent in the appointment was 25 minutes and more than 50% was on counseling.     [name], MD 06/10/2017 11:04 PM   This document serves as a record of services personally performed by [name], MD. It was created on her behalf by Joslyn Devon, a trained medical scribe. The creation of this record is based on the scribe's personal observations and the provider's statements to them.    I have reviewed the above documentation for accuracy and completeness, and I agree with the above.

## 2017-06-10 ENCOUNTER — Encounter: Payer: Self-pay | Admitting: Hematology

## 2017-06-10 ENCOUNTER — Inpatient Hospital Stay: Payer: Medicaid Other

## 2017-06-10 ENCOUNTER — Inpatient Hospital Stay (HOSPITAL_BASED_OUTPATIENT_CLINIC_OR_DEPARTMENT_OTHER): Payer: Medicaid Other | Admitting: Hematology

## 2017-06-10 ENCOUNTER — Encounter: Payer: Self-pay | Admitting: *Deleted

## 2017-06-10 VITALS — BP 127/72 | HR 77 | Temp 98.2°F | Resp 18 | Ht 64.96 in | Wt 202.6 lb

## 2017-06-10 DIAGNOSIS — R42 Dizziness and giddiness: Secondary | ICD-10-CM

## 2017-06-10 DIAGNOSIS — C50812 Malignant neoplasm of overlapping sites of left female breast: Secondary | ICD-10-CM

## 2017-06-10 DIAGNOSIS — R51 Headache: Secondary | ICD-10-CM

## 2017-06-10 DIAGNOSIS — C773 Secondary and unspecified malignant neoplasm of axilla and upper limb lymph nodes: Secondary | ICD-10-CM

## 2017-06-10 DIAGNOSIS — Z171 Estrogen receptor negative status [ER-]: Secondary | ICD-10-CM

## 2017-06-10 DIAGNOSIS — F419 Anxiety disorder, unspecified: Secondary | ICD-10-CM | POA: Diagnosis not present

## 2017-06-10 DIAGNOSIS — N939 Abnormal uterine and vaginal bleeding, unspecified: Secondary | ICD-10-CM

## 2017-06-10 DIAGNOSIS — Z95828 Presence of other vascular implants and grafts: Secondary | ICD-10-CM

## 2017-06-10 DIAGNOSIS — Z5111 Encounter for antineoplastic chemotherapy: Secondary | ICD-10-CM | POA: Diagnosis not present

## 2017-06-10 LAB — CMP (CANCER CENTER ONLY)
ALBUMIN: 3.6 g/dL (ref 3.5–5.0)
ALT: 20 U/L (ref 0–55)
ANION GAP: 9 (ref 3–11)
AST: 18 U/L (ref 5–34)
Alkaline Phosphatase: 130 U/L (ref 40–150)
BILIRUBIN TOTAL: 0.2 mg/dL (ref 0.2–1.2)
BUN: 7 mg/dL (ref 7–26)
CHLORIDE: 105 mmol/L (ref 98–109)
CO2: 26 mmol/L (ref 22–29)
Calcium: 9.5 mg/dL (ref 8.4–10.4)
Creatinine: 0.73 mg/dL (ref 0.60–1.10)
GFR, Est AFR Am: >60 mL/min (ref >60)
GFR, Estimated: >60 mL/min (ref >60)
Glucose, Bld: 95 mg/dL (ref 70–140)
POTASSIUM: 3.9 mmol/L (ref 3.5–5.1)
SODIUM: 140 mmol/L (ref 136–145)
TOTAL PROTEIN: 7.1 g/dL (ref 6.4–8.3)

## 2017-06-10 LAB — CBC WITH DIFFERENTIAL (CANCER CENTER ONLY)
BASOS ABS: 0.1 10*3/uL (ref 0.0–0.1)
Basophils Relative: 1 %
EOS PCT: 0 %
Eosinophils Absolute: 0 10*3/uL (ref 0.0–0.5)
HEMATOCRIT: 32.7 % — AB (ref 34.8–46.6)
Hemoglobin: 11 g/dL — ABNORMAL LOW (ref 11.6–15.9)
LYMPHS ABS: 1.9 10*3/uL (ref 0.9–3.3)
Lymphocytes Relative: 11 %
MCH: 29.8 pg (ref 25.1–34.0)
MCHC: 33.6 g/dL (ref 31.5–36.0)
MCV: 88.6 fL (ref 79.5–101.0)
MONO ABS: 1.6 10*3/uL — AB (ref 0.1–0.9)
MONOS PCT: 9 %
NEUTROS ABS: 13.7 10*3/uL — AB (ref 1.5–6.5)
Neutrophils Relative %: 79 %
PLATELETS: 159 10*3/uL (ref 145–400)
RBC: 3.69 MIL/uL — ABNORMAL LOW (ref 3.70–5.45)
RDW: 12.9 % (ref 11.2–14.5)
WBC Count: 17.4 10*3/uL — ABNORMAL HIGH (ref 3.9–10.3)

## 2017-06-10 MED ORDER — PALONOSETRON HCL INJECTION 0.25 MG/5ML
0.2500 mg | Freq: Once | INTRAVENOUS | Status: AC
  Administered 2017-06-10: 0.25 mg via INTRAVENOUS

## 2017-06-10 MED ORDER — DOXORUBICIN HCL CHEMO IV INJECTION 2 MG/ML
60.0000 mg/m2 | Freq: Once | INTRAVENOUS | Status: AC
  Administered 2017-06-10: 120 mg via INTRAVENOUS
  Filled 2017-06-10: qty 60

## 2017-06-10 MED ORDER — SODIUM CHLORIDE 0.9% FLUSH
10.0000 mL | INTRAVENOUS | Status: DC | PRN
  Administered 2017-06-10: 10 mL
  Filled 2017-06-10: qty 10

## 2017-06-10 MED ORDER — SODIUM CHLORIDE 0.9 % IV SOLN
Freq: Once | INTRAVENOUS | Status: AC
  Administered 2017-06-10: 12:00:00 via INTRAVENOUS

## 2017-06-10 MED ORDER — ANTICOAGULANT SODIUM CITRATE 4% (200MG/5ML) IV SOLN
5.0000 mL | Freq: Once | Status: AC
  Administered 2017-06-10: 5 mL via INTRAVENOUS
  Filled 2017-06-10: qty 5

## 2017-06-10 MED ORDER — SODIUM CHLORIDE 0.9 % IV SOLN
Freq: Once | INTRAVENOUS | Status: AC
  Administered 2017-06-10: 12:00:00 via INTRAVENOUS
  Filled 2017-06-10: qty 5

## 2017-06-10 MED ORDER — PALONOSETRON HCL INJECTION 0.25 MG/5ML
INTRAVENOUS | Status: AC
  Filled 2017-06-10: qty 5

## 2017-06-10 MED ORDER — SODIUM CHLORIDE 0.9% FLUSH
10.0000 mL | Freq: Once | INTRAVENOUS | Status: AC
  Administered 2017-06-10: 10 mL
  Filled 2017-06-10: qty 10

## 2017-06-10 MED ORDER — SODIUM CHLORIDE 0.9 % IV SOLN
600.0000 mg/m2 | Freq: Once | INTRAVENOUS | Status: AC
  Administered 2017-06-10: 1200 mg via INTRAVENOUS
  Filled 2017-06-10: qty 60

## 2017-06-10 NOTE — Patient Instructions (Signed)
[facility] Discharge Instructions for Patients Receiving Chemotherapy  Today you received the following chemotherapy agents Adriamycin, and Cytoxan.   To help prevent nausea and vomiting after your treatment, we encourage you to take your nausea medication as directed.   If you develop nausea and vomiting that is not controlled by your nausea medication, call the clinic.   BELOW ARE SYMPTOMS THAT SHOULD BE REPORTED IMMEDIATELY:  *FEVER GREATER THAN 100.5 F  *CHILLS WITH OR WITHOUT FEVER  NAUSEA AND VOMITING THAT IS NOT CONTROLLED WITH YOUR NAUSEA MEDICATION  *UNUSUAL SHORTNESS OF BREATH  *UNUSUAL BRUISING OR BLEEDING  TENDERNESS IN MOUTH AND THROAT WITH OR WITHOUT PRESENCE OF ULCERS  *URINARY PROBLEMS  *BOWEL PROBLEMS  UNUSUAL RASH Items with * indicate a potential emergency and should be followed up as soon as possible.  Feel free to call the clinic should you have any questions or concerns. The clinic phone number is [phone].  Please show the [place] at check-in to the Emergency Department and triage nurse.   Doxorubicin injection What is this medicine? DOXORUBICIN (dox oh ROO bi sin) is a chemotherapy drug. It is used to treat many kinds of cancer like leukemia, lymphoma, neuroblastoma, sarcoma, and Wilms' tumor. It is also used to treat bladder cancer, breast cancer, lung cancer, ovarian cancer, stomach cancer, and thyroid cancer. This medicine may be used for other purposes; ask your health care provider or pharmacist if you have questions. COMMON BRAND NAME(S): Adriamycin, Adriamycin PFS, Adriamycin RDF, Rubex What should I tell my health care provider before I take this medicine? They need to know if you have any of these conditions: -heart disease -history of low blood counts caused by a medicine -liver disease -recent or ongoing radiation therapy -an unusual or allergic reaction to doxorubicin, other chemotherapy agents, other  medicines, foods, dyes, or preservatives -pregnant or trying to get pregnant -breast-feeding How should I use this medicine? This drug is given as an infusion into a vein. It is administered in a hospital or clinic by a specially trained health care professional. If you have pain, swelling, burning or any unusual feeling around the site of your injection, tell your health care professional right away. Talk to your pediatrician regarding the use of this medicine in children. Special care may be needed. Overdosage: If you think you have taken too much of this medicine contact a poison control center or emergency room at once. NOTE: This medicine is only for you. Do not share this medicine with others. What if I miss a dose? It is important not to miss your dose. Call your doctor or health care professional if you are unable to keep an appointment. What may interact with this medicine? This medicine may interact with the following medications: -6-mercaptopurine -paclitaxel -phenytoin -St. John's Wort -trastuzumab -verapamil This list may not describe all possible interactions. Give your health care provider a list of all the medicines, herbs, non-prescription drugs, or dietary supplements you use. Also tell them if you smoke, drink alcohol, or use illegal drugs. Some items may interact with your medicine. What should I watch for while using this medicine? This drug may make you feel generally unwell. This is not uncommon, as chemotherapy can affect healthy cells as well as cancer cells. Report any side effects. Continue your course of treatment even though you feel ill unless your doctor tells you to stop. There is a maximum amount of this medicine you should receive throughout your life. The amount  depends on the medical condition being treated and your overall health. Your doctor will watch how much of this medicine you receive in your lifetime. Tell your doctor if you have taken this medicine  before. You may need blood work done while you are taking this medicine. Your urine may turn red for a few days after your dose. This is not blood. If your urine is dark or brown, call your doctor. In some cases, you may be given additional medicines to help with side effects. Follow all directions for their use. Call your doctor or health care professional for advice if you get a fever, chills or sore throat, or other symptoms of a cold or flu. Do not treat yourself. This drug decreases your body's ability to fight infections. Try to avoid being around people who are sick. This medicine may increase your risk to bruise or bleed. Call your doctor or health care professional if you notice any unusual bleeding. Talk to your doctor about your risk of cancer. You may be more at risk for certain types of cancers if you take this medicine. Do not become pregnant while taking this medicine or for 6 months after stopping it. Women should inform their doctor if they wish to become pregnant or think they might be pregnant. Men should not father a child while taking this medicine and for 6 months after stopping it. There is a potential for serious side effects to an unborn child. Talk to your health care professional or pharmacist for more information. Do not breast-feed an infant while taking this medicine. This medicine has caused ovarian failure in some women and reduced sperm counts in some men This medicine may interfere with the ability to have a child. Talk with your doctor or health care professional if you are concerned about your fertility. What side effects may I notice from receiving this medicine? Side effects that you should report to your doctor or health care professional as soon as possible: -allergic reactions like skin rash, itching or hives, swelling of the face, lips, or tongue -breathing problems -chest pain -fast or irregular heartbeat -low blood counts - this medicine may decrease the  number of white blood cells, red blood cells and platelets. You may be at increased risk for infections and bleeding. -pain, redness, or irritation at site where injected -signs of infection - fever or chills, cough, sore throat, pain or difficulty passing urine -signs of decreased platelets or bleeding - bruising, pinpoint red spots on the skin, black, tarry stools, blood in the urine -swelling of the ankles, feet, hands -tiredness -weakness Side effects that usually do not require medical attention (report to your doctor or health care professional if they continue or are bothersome): -diarrhea -hair loss -mouth sores -nail discoloration or damage -nausea -red colored urine -vomiting This list may not describe all possible side effects. Call your doctor for medical advice about side effects. You may report side effects to FDA at 1-800-FDA-1088. Where should I keep my medicine? This drug is given in a hospital or clinic and will not be stored at home. NOTE: This sheet is a summary. It may not cover all possible information. If you have questions about this medicine, talk to your doctor, pharmacist, or health care provider.  2018 Elsevier/Gold Standard (2015-06-03 11:28:51)  Cyclophosphamide injection What is this medicine? CYCLOPHOSPHAMIDE (sye kloe FOSS fa mide) is a chemotherapy drug. It slows the growth of cancer cells. This medicine is used to treat many types of cancer  leukemia, breast cancer, and ovarian cancer, to name a few. This medicine may be used for other purposes; ask your health care provider or pharmacist if you have questions. COMMON BRAND NAME(S): Cytoxan, Neosar What should I tell my health care provider before I take this medicine? They need to know if you have any of these conditions: -blood disorders -history of other chemotherapy -infection -kidney disease -liver disease -recent or ongoing radiation therapy -tumors in the bone marrow -an unusual or  allergic reaction to cyclophosphamide, other chemotherapy, other medicines, foods, dyes, or preservatives -pregnant or trying to get pregnant -breast-feeding How should I use this medicine? This drug is usually given as an injection into a vein or muscle or by infusion into a vein. It is administered in a hospital or clinic by a specially trained health care professional. Talk to your pediatrician regarding the use of this medicine in children. Special care may be needed. Overdosage: If you think you have taken too much of this medicine contact a poison control center or emergency room at once. NOTE: This medicine is only for you. Do not share this medicine with others. What if I miss a dose? It is important not to miss your dose. Call your doctor or health care professional if you are unable to keep an appointment. What may interact with this medicine? This medicine may interact with the following medications: -amiodarone -amphotericin B -azathioprine -certain antiviral medicines for HIV or AIDS such as protease inhibitors (e.g., indinavir, ritonavir) and zidovudine -certain blood pressure medications such as benazepril, captopril, enalapril, fosinopril, lisinopril, moexipril, monopril, perindopril, quinapril, ramipril, trandolapril -certain cancer medications such as anthracyclines (e.g., daunorubicin, doxorubicin), busulfan, cytarabine, paclitaxel, pentostatin, tamoxifen, trastuzumab -certain diuretics such as chlorothiazide, chlorthalidone, hydrochlorothiazide, indapamide, metolazone -certain medicines that treat or prevent blood clots like warfarin -certain muscle relaxants such as succinylcholine -cyclosporine -etanercept -indomethacin -medicines to increase blood counts like filgrastim, pegfilgrastim, sargramostim -medicines used as general anesthesia -metronidazole -natalizumab This list may not describe all possible interactions. Give your health care provider a list of all the  medicines, herbs, non-prescription drugs, or dietary supplements you use. Also tell them if you smoke, drink alcohol, or use illegal drugs. Some items may interact with your medicine. What should I watch for while using this medicine? Visit your doctor for checks on your progress. This drug may make you feel generally unwell. This is not uncommon, as chemotherapy can affect healthy cells as well as cancer cells. Report any side effects. Continue your course of treatment even though you feel ill unless your doctor tells you to stop. Drink water or other fluids as directed. Urinate often, even at night. In some cases, you may be given additional medicines to help with side effects. Follow all directions for their use. Call your doctor or health care professional for advice if you get a fever, chills or sore throat, or other symptoms of a cold or flu. Do not treat yourself. This drug decreases your body's ability to fight infections. Try to avoid being around people who are sick. This medicine may increase your risk to bruise or bleed. Call your doctor or health care professional if you notice any unusual bleeding. Be careful brushing and flossing your teeth or using a toothpick because you may get an infection or bleed more easily. If you have any dental work done, tell your dentist you are receiving this medicine. You may get drowsy or dizzy. Do not drive, use machinery, or do anything that needs mental alertness until   you know how this medicine affects you. Do not become pregnant while taking this medicine or for 1 year after stopping it. Women should inform their doctor if they wish to become pregnant or think they might be pregnant. Men should not father a child while taking this medicine and for 4 months after stopping it. There is a potential for serious side effects to an unborn child. Talk to your health care professional or pharmacist for more information. Do not breast-feed an infant while taking  this medicine. This medicine may interfere with the ability to have a child. This medicine has caused ovarian failure in some women. This medicine has caused reduced sperm counts in some men. You should talk with your doctor or health care professional if you are concerned about your fertility. If you are going to have surgery, tell your doctor or health care professional that you have taken this medicine. What side effects may I notice from receiving this medicine? Side effects that you should report to your doctor or health care professional as soon as possible: -allergic reactions like skin rash, itching or hives, swelling of the face, lips, or tongue -low blood counts - this medicine may decrease the number of white blood cells, red blood cells and platelets. You may be at increased risk for infections and bleeding. -signs of infection - fever or chills, cough, sore throat, pain or difficulty passing urine -signs of decreased platelets or bleeding - bruising, pinpoint red spots on the skin, black, tarry stools, blood in the urine -signs of decreased red blood cells - unusually weak or tired, fainting spells, lightheadedness -breathing problems -dark urine -dizziness -palpitations -swelling of the ankles, feet, hands -trouble passing urine or change in the amount of urine -weight gain -yellowing of the eyes or skin Side effects that usually do not require medical attention (report to your doctor or health care professional if they continue or are bothersome): -changes in nail or skin color -hair loss -missed menstrual periods -mouth sores -nausea, vomiting This list may not describe all possible side effects. Call your doctor for medical advice about side effects. You may report side effects to FDA at 1-800-FDA-1088. Where should I keep my medicine? This drug is given in a hospital or clinic and will not be stored at home. NOTE: This sheet is a summary. It may not cover all possible  information. If you have questions about this medicine, talk to your doctor, pharmacist, or health care provider.  2018 Elsevier/Gold Standard (2012-02-19 16:22:58)  

## 2017-06-11 ENCOUNTER — Encounter: Payer: Self-pay | Admitting: Pharmacy Technician

## 2017-06-11 NOTE — Progress Notes (Signed)
Drug assistance is closed since the patient has been approved for Mediciad effective 03/20/17 - 03/19/18. We have received Emend in the pharmacy for DOS 06/10/17 and Zoladex 3.6 and Neulasta for DOS 06/12/17. Billing for these drugs under drug assistance should be subsequent to these dates.

## 2017-06-12 ENCOUNTER — Inpatient Hospital Stay: Payer: Medicaid Other

## 2017-06-12 ENCOUNTER — Encounter: Payer: Self-pay | Admitting: Hematology

## 2017-06-12 VITALS — BP 113/71 | HR 74 | Temp 98.0°F | Resp 18

## 2017-06-12 DIAGNOSIS — C50812 Malignant neoplasm of overlapping sites of left female breast: Secondary | ICD-10-CM

## 2017-06-12 DIAGNOSIS — Z5111 Encounter for antineoplastic chemotherapy: Secondary | ICD-10-CM | POA: Diagnosis not present

## 2017-06-12 MED ORDER — PEGFILGRASTIM INJECTION 6 MG/0.6ML ~~LOC~~
PREFILLED_SYRINGE | SUBCUTANEOUS | Status: AC
  Filled 2017-06-12: qty 0.6

## 2017-06-12 MED ORDER — PEGFILGRASTIM INJECTION 6 MG/0.6ML ~~LOC~~
6.0000 mg | PREFILLED_SYRINGE | Freq: Once | SUBCUTANEOUS | Status: AC
  Administered 2017-06-12: 6 mg via SUBCUTANEOUS

## 2017-06-12 NOTE — Patient Instructions (Signed)
Pegfilgrastim injection What is this medicine? PEGFILGRASTIM (PEG fil gra stim) is a long-acting granulocyte colony-stimulating factor that stimulates the growth of neutrophils, a type of white blood cell important in the body's fight against infection. It is used to reduce the incidence of fever and infection in patients with certain types of cancer who are receiving chemotherapy that affects the bone marrow, and to increase survival after being exposed to high doses of radiation. This medicine may be used for other purposes; ask your health care provider or pharmacist if you have questions. COMMON BRAND NAME(S): Neulasta What should I tell my health care provider before I take this medicine? They need to know if you have any of these conditions: -kidney disease -latex allergy -ongoing radiation therapy -sickle cell disease -skin reactions to acrylic adhesives (On-Body Injector only) -an unusual or allergic reaction to pegfilgrastim, filgrastim, other medicines, foods, dyes, or preservatives -pregnant or trying to get pregnant -breast-feeding How should I use this medicine? This medicine is for injection under the skin. If you get this medicine at home, you will be taught how to prepare and give the pre-filled syringe or how to use the On-body Injector. Refer to the patient Instructions for Use for detailed instructions. Use exactly as directed. Tell your healthcare provider immediately if you suspect that the On-body Injector may not have performed as intended or if you suspect the use of the On-body Injector resulted in a missed or partial dose. It is important that you put your used needles and syringes in a special sharps container. Do not put them in a trash can. If you do not have a sharps container, call your pharmacist or healthcare provider to get one. Talk to your pediatrician regarding the use of this medicine in children. While this drug may be prescribed for selected conditions,  precautions do apply. Overdosage: If you think you have taken too much of this medicine contact a poison control center or emergency room at once. NOTE: This medicine is only for you. Do not share this medicine with others. What if I miss a dose? It is important not to miss your dose. Call your doctor or health care professional if you miss your dose. If you miss a dose due to an On-body Injector failure or leakage, a new dose should be administered as soon as possible using a single prefilled syringe for manual use. What may interact with this medicine? Interactions have not been studied. Give your health care provider a list of all the medicines, herbs, non-prescription drugs, or dietary supplements you use. Also tell them if you smoke, drink alcohol, or use illegal drugs. Some items may interact with your medicine. This list may not describe all possible interactions. Give your health care provider a list of all the medicines, herbs, non-prescription drugs, or dietary supplements you use. Also tell them if you smoke, drink alcohol, or use illegal drugs. Some items may interact with your medicine. What should I watch for while using this medicine? You may need blood work done while you are taking this medicine. If you are going to need a MRI, CT scan, or other procedure, tell your doctor that you are using this medicine (On-Body Injector only). What side effects may I notice from receiving this medicine? Side effects that you should report to your doctor or health care professional as soon as possible: -allergic reactions like skin rash, itching or hives, swelling of the face, lips, or tongue -dizziness -fever -pain, redness, or irritation at site   where injected -pinpoint red spots on the skin -red or dark-brown urine -shortness of breath or breathing problems -stomach or side pain, or pain at the shoulder -swelling -tiredness -trouble passing urine or change in the amount of urine Side  effects that usually do not require medical attention (report to your doctor or health care professional if they continue or are bothersome): -bone pain -muscle pain This list may not describe all possible side effects. Call your doctor for medical advice about side effects. You may report side effects to FDA at 1-800-FDA-1088. Where should I keep my medicine? Keep out of the reach of children. Store pre-filled syringes in a refrigerator between 2 and 8 degrees C (36 and 46 degrees F). Do not freeze. Keep in carton to protect from light. Throw away this medicine if it is left out of the refrigerator for more than 48 hours. Throw away any unused medicine after the expiration date. NOTE: This sheet is a summary. It may not cover all possible information. If you have questions about this medicine, talk to your doctor, pharmacist, or health care provider.  2018 Elsevier/Gold Standard (2016-04-02 12:58:03)  

## 2017-06-14 ENCOUNTER — Encounter (HOSPITAL_COMMUNITY): Payer: Self-pay | Admitting: *Deleted

## 2017-06-24 ENCOUNTER — Inpatient Hospital Stay: Payer: Medicaid Other

## 2017-06-24 ENCOUNTER — Telehealth: Payer: Self-pay | Admitting: Nurse Practitioner

## 2017-06-24 ENCOUNTER — Encounter: Payer: Self-pay | Admitting: Nurse Practitioner

## 2017-06-24 ENCOUNTER — Inpatient Hospital Stay: Payer: Medicaid Other | Attending: Nurse Practitioner | Admitting: Nurse Practitioner

## 2017-06-24 VITALS — BP 117/75 | HR 81 | Temp 97.9°F | Resp 20 | Ht 64.0 in | Wt 201.5 lb

## 2017-06-24 DIAGNOSIS — C773 Secondary and unspecified malignant neoplasm of axilla and upper limb lymph nodes: Secondary | ICD-10-CM | POA: Diagnosis not present

## 2017-06-24 DIAGNOSIS — T451X5A Adverse effect of antineoplastic and immunosuppressive drugs, initial encounter: Secondary | ICD-10-CM

## 2017-06-24 DIAGNOSIS — N879 Dysplasia of cervix uteri, unspecified: Secondary | ICD-10-CM | POA: Insufficient documentation

## 2017-06-24 DIAGNOSIS — R42 Dizziness and giddiness: Secondary | ICD-10-CM

## 2017-06-24 DIAGNOSIS — R52 Pain, unspecified: Secondary | ICD-10-CM | POA: Insufficient documentation

## 2017-06-24 DIAGNOSIS — G620 Drug-induced polyneuropathy: Secondary | ICD-10-CM | POA: Insufficient documentation

## 2017-06-24 DIAGNOSIS — Z95828 Presence of other vascular implants and grafts: Secondary | ICD-10-CM

## 2017-06-24 DIAGNOSIS — R51 Headache: Secondary | ICD-10-CM | POA: Diagnosis not present

## 2017-06-24 DIAGNOSIS — Z5111 Encounter for antineoplastic chemotherapy: Secondary | ICD-10-CM | POA: Insufficient documentation

## 2017-06-24 DIAGNOSIS — C50812 Malignant neoplasm of overlapping sites of left female breast: Secondary | ICD-10-CM | POA: Diagnosis not present

## 2017-06-24 DIAGNOSIS — Z171 Estrogen receptor negative status [ER-]: Secondary | ICD-10-CM | POA: Diagnosis not present

## 2017-06-24 DIAGNOSIS — N951 Menopausal and female climacteric states: Secondary | ICD-10-CM | POA: Diagnosis not present

## 2017-06-24 DIAGNOSIS — Z5189 Encounter for other specified aftercare: Secondary | ICD-10-CM | POA: Insufficient documentation

## 2017-06-24 DIAGNOSIS — M898X9 Other specified disorders of bone, unspecified site: Secondary | ICD-10-CM

## 2017-06-24 DIAGNOSIS — F419 Anxiety disorder, unspecified: Secondary | ICD-10-CM | POA: Diagnosis not present

## 2017-06-24 DIAGNOSIS — R110 Nausea: Secondary | ICD-10-CM

## 2017-06-24 LAB — CBC WITH DIFFERENTIAL (CANCER CENTER ONLY)
BASOS ABS: 0.2 10*3/uL — AB (ref 0.0–0.1)
Basophils Relative: 1 %
EOS PCT: 0 %
Eosinophils Absolute: 0 10*3/uL (ref 0.0–0.5)
HEMATOCRIT: 31 % — AB (ref 34.8–46.6)
HEMOGLOBIN: 10.4 g/dL — AB (ref 11.6–15.9)
LYMPHS PCT: 8 %
Lymphs Abs: 1.5 10*3/uL (ref 0.9–3.3)
MCH: 30 pg (ref 25.1–34.0)
MCHC: 33.5 g/dL (ref 31.5–36.0)
MCV: 89.3 fL (ref 79.5–101.0)
MONO ABS: 1.8 10*3/uL — AB (ref 0.1–0.9)
MONOS PCT: 9 %
NEUTROS ABS: 16.9 10*3/uL — AB (ref 1.5–6.5)
Neutrophils Relative %: 82 %
Platelet Count: 210 10*3/uL (ref 145–400)
RBC: 3.47 MIL/uL — ABNORMAL LOW (ref 3.70–5.45)
RDW: 13.9 % (ref 11.2–14.5)
WBC Count: 20.4 10*3/uL — ABNORMAL HIGH (ref 3.9–10.3)
nRBC: 1 /100 WBC — ABNORMAL HIGH

## 2017-06-24 LAB — CMP (CANCER CENTER ONLY)
ALBUMIN: 3.6 g/dL (ref 3.5–5.0)
ALK PHOS: 124 U/L (ref 40–150)
ALT: 21 U/L (ref 0–55)
AST: 17 U/L (ref 5–34)
Anion gap: 7 (ref 3–11)
BILIRUBIN TOTAL: 0.3 mg/dL (ref 0.2–1.2)
BUN: 11 mg/dL (ref 7–26)
CALCIUM: 9.4 mg/dL (ref 8.4–10.4)
CO2: 25 mmol/L (ref 22–29)
Chloride: 108 mmol/L (ref 98–109)
Creatinine: 0.69 mg/dL (ref 0.60–1.10)
GFR, Est AFR Am: >60 mL/min (ref >60)
GFR, Estimated: >60 mL/min (ref >60)
GLUCOSE: 96 mg/dL (ref 70–140)
POTASSIUM: 4.1 mmol/L (ref 3.5–5.1)
SODIUM: 140 mmol/L (ref 136–145)
TOTAL PROTEIN: 7.1 g/dL (ref 6.4–8.3)

## 2017-06-24 MED ORDER — SODIUM CHLORIDE 0.9 % IV SOLN
Freq: Once | INTRAVENOUS | Status: AC
  Administered 2017-06-24: 13:00:00 via INTRAVENOUS
  Filled 2017-06-24: qty 5

## 2017-06-24 MED ORDER — PALONOSETRON HCL INJECTION 0.25 MG/5ML
0.2500 mg | Freq: Once | INTRAVENOUS | Status: AC
  Administered 2017-06-24: 0.25 mg via INTRAVENOUS

## 2017-06-24 MED ORDER — GOSERELIN ACETATE 3.6 MG ~~LOC~~ IMPL
3.6000 mg | DRUG_IMPLANT | Freq: Once | SUBCUTANEOUS | Status: AC
  Administered 2017-06-24: 3.6 mg via SUBCUTANEOUS

## 2017-06-24 MED ORDER — ANTICOAGULANT SODIUM CITRATE 4% (200MG/5ML) IV SOLN
5.0000 mL | Freq: Once | Status: AC
  Administered 2017-06-24: 5 mL via INTRAVENOUS
  Filled 2017-06-24: qty 5

## 2017-06-24 MED ORDER — SODIUM CHLORIDE 0.9% FLUSH
10.0000 mL | Freq: Once | INTRAVENOUS | Status: AC
  Administered 2017-06-24: 10 mL
  Filled 2017-06-24: qty 10

## 2017-06-24 MED ORDER — SODIUM CHLORIDE 0.9 % IV SOLN
Freq: Once | INTRAVENOUS | Status: AC
  Administered 2017-06-24: 13:00:00 via INTRAVENOUS

## 2017-06-24 MED ORDER — DOXORUBICIN HCL CHEMO IV INJECTION 2 MG/ML
60.0000 mg/m2 | Freq: Once | INTRAVENOUS | Status: AC
  Administered 2017-06-24: 120 mg via INTRAVENOUS
  Filled 2017-06-24: qty 60

## 2017-06-24 MED ORDER — LORAZEPAM 0.5 MG PO TABS: 0.5000 mg | ORAL_TABLET | Freq: Three times a day (TID) | ORAL | 0 refills | Status: AC | PRN

## 2017-06-24 MED ORDER — SODIUM CHLORIDE 0.9% FLUSH
10.0000 mL | INTRAVENOUS | Status: DC | PRN
  Administered 2017-06-24: 10 mL
  Filled 2017-06-24: qty 10

## 2017-06-24 MED ORDER — SODIUM CHLORIDE 0.9 % IV SOLN
600.0000 mg/m2 | Freq: Once | INTRAVENOUS | Status: AC
  Administered 2017-06-24: 1200 mg via INTRAVENOUS
  Filled 2017-06-24: qty 60

## 2017-06-24 MED ORDER — GOSERELIN ACETATE 3.6 MG ~~LOC~~ IMPL
DRUG_IMPLANT | SUBCUTANEOUS | Status: AC
  Filled 2017-06-24: qty 3.6

## 2017-06-24 MED ORDER — PALONOSETRON HCL INJECTION 0.25 MG/5ML
INTRAVENOUS | Status: AC
  Filled 2017-06-24: qty 5

## 2017-06-24 MED FILL — LORazepam 0.5 MG TABS: 0.5 | 6 days supply | Qty: 20 | Fill #0

## 2017-06-24 NOTE — Telephone Encounter (Signed)
Scheduled appt per 3/7 los - Gave patient AVS and calender per los.  

## 2017-06-24 NOTE — Patient Instructions (Signed)
[facility] Discharge Instructions for Patients Receiving Chemotherapy  Today you received the following chemotherapy agents Adriamycin and Cytoxan  To help prevent nausea and vomiting after your treatment, we encourage you to take your nausea medication as directed.  If you develop nausea and vomiting that is not controlled by your nausea medication, call the clinic.   BELOW ARE SYMPTOMS THAT SHOULD BE REPORTED IMMEDIATELY:  *FEVER GREATER THAN 100.5 F  *CHILLS WITH OR WITHOUT FEVER  NAUSEA AND VOMITING THAT IS NOT CONTROLLED WITH YOUR NAUSEA MEDICATION  *UNUSUAL SHORTNESS OF BREATH  *UNUSUAL BRUISING OR BLEEDING  TENDERNESS IN MOUTH AND THROAT WITH OR WITHOUT PRESENCE OF ULCERS  *URINARY PROBLEMS  *BOWEL PROBLEMS  UNUSUAL RASH Items with * indicate a potential emergency and should be followed up as soon as possible.  Feel free to call the clinic should you have any questions or concerns. The clinic phone number is [phone].  Please show the CHEMO ALERT CARD at check-in to the Emergency Department and triage nurse.   

## 2017-06-24 NOTE — Progress Notes (Signed)
[place]  Telephone:([phone]) [phone] Fax:(336) [phone]  Clinic Follow up Note   Patient Care Team: Patient, No Pcp Per as PCP - General ([place]) [name], MD as Consulting Physician (General Surgery) [name], MD as Consulting Physician (Hematology) [name], NP as Nurse Practitioner (Nurse Practitioner) 06/24/2017  SUMMARY OF ONCOLOGIC HISTORY:   Cancer of overlapping sites of left breast ([place])   04/15/2017 Mammogram    IMPRESSION: 1. Highly suspicious irregular mass within the left breast at the 4 o'clock axis, 8 cm from the nipple, measuring 5.2 cm, corresponding to the area of clinical concern. Ultrasound-guided biopsy is recommended. 2. Additional suspicious irregular mass within the left breast at the 2 o'clock axis, 8 cm from the nipple, measuring 1.6 cm, corresponding to the mammographic finding. Ultrasound-guided biopsy is recommended. 3. Additional suspicious mass within the left breast at the 2 o'clock axis, 10 cm from the nipple, axillary tail region, measuring 3 cm, suspected lymph node completely replaced by tumor. Ultrasound-guided biopsy is recommended. 4. No evidence of malignancy within the right breast.       04/15/2017 Breast US    Left breast: Targeted ultrasound is performed, showing an irregular mass in the left breast at the 4 o'clock axis, 8 cm from the nipple, measuring 5.2 x 4.3 x 4.3 cm, with internal vascularity, corresponding to the mammographic finding and palpable lump.  There is an additional irregular hypoechoic mass in the left breast at the 2 o'clock axis, 8 cm from the nipple, measuring 1.6 x 1.3 x 1.3 cm, with internal vascularity, corresponding to the additional mass seen within the outer left breast on mammogram, suspected satellite mass.  Lastly, there is an oval circumscribed hypoechoic mass in the left breast at the 2 o'clock axis, 10 cm from the nipple, axillary tail region, with  heterogeneous echotexture, measuring 3 x 2.3 x 2.8 cm, suspected lymph node completely replaced by tumor.  Remainder of the left axilla was evaluated with ultrasound showing no additional enlarged or morphologically abnormal lymph nodes.  IMPRESSION: 1. Highly suspicious irregular mass within the left breast at the 4 o'clock axis, 8 cm from the nipple, measuring 5.2 cm, corresponding to the area of clinical concern. Ultrasound-guided biopsy is recommended. 2. Additional suspicious irregular mass within the left breast at the 2 o'clock axis, 8 cm from the nipple, measuring 1.6 cm, corresponding to the mammographic finding. Ultrasound-guided biopsy is recommended. 3. Additional suspicious mass within the left breast at the 2 o'clock axis, 10 cm from the nipple, axillary tail region, measuring 3 cm, suspected lymph node completely replaced by tumor. Ultrasound-guided biopsy is recommended. 4. No evidence of malignancy within the right breast.       04/19/2017 Initial Biopsy    Diagnosis 1. Breast, left, needle core biopsy, 4:00 o'clock, ribbon clip - INVASIVE DUCTAL CARCINOMA. - SEE COMMENT. 2. Breast, left, needle core biopsy, 2:00 o'clock, coil clip - INVASIVE DUCTAL CARCINOMA. - SEE COMMENT. 3. Lymph node, needle/core biopsy, left axilla, spiral hydromark - DUCTAL CARCINOMA. - SEE COMMENT.  2. PROGNOSTIC INDICATORS Results: IMMUNOHISTOCHEMICAL AND MORPHOMETRIC ANALYSIS PERFORMED MANUALLY Estrogen Receptor: 0%, NEGATIVE Progesterone Receptor: 0%, NEGATIVE Proliferation Marker Ki67: 80%  2. FLUORESCENCE IN-SITU HYBRIDIZATION Results: HER2 - NEGATIVE RATIO OF HER2/CEP17 SIGNALS 1.53 AVERAGE HER2 COPY NUMBER PER CELL 2.75  Microscopic Comment 1. The carcinoma in the three specimens is morphologically similar and is grade III. Lymph nodal tissue is not definitively identified in specimen #3. A breast prognostic profile will be performed  on part 2 and the  results reported separately.      04/28/2017 Initial Diagnosis    Cancer of overlapping sites of left breast ([place])      04/30/2017 Breast MRI    IMPRESSION: 1. Biopsy-proven invasive carcinoma within the lower outer quadrant of the LEFT breast, 4 o'clock axis, at posterior depth, measuring 4.8 cm, with associated biopsy clip artifact. 2. Biopsy-proven invasive carcinoma within the upper-outer quadrant of the LEFT breast, 2 o'clock axis, at posterior depth, measuring 2.5 cm, with associated biopsy clip artifact. However, contiguous non mass enhancement along the posterior margin of this 2 o'clock mass and extending 3 cm anteriorly from the mass increases the overall measurement to 5.5 cm greatest dimension (AP). 3. Diffuse edema throughout the LEFT breast, with particularly prominent component of edema extending posteriorly to abut the anterior surface of the pectoralis muscle, and diffuse skin thickening throughout the left breast. This almost certainly indicates INFLAMMATORY BREAST CANCER. 4. Biopsy-proven metastatic lymph node within the LEFT axilla measures 4.1 cm. Two additional borderline prominent lymph nodes within the left axilla. No enlarged lymph nodes within the right axilla or internal mammary chain regions. 5. No evidence of malignancy within the RIGHT breast.  RECOMMENDATION: Per current treatment plan for patient's known left breast cancer (2 biopsy-proven sites) and metastatic lymph node in the left axilla.  BI-RADS CATEGORY  6: Known biopsy-proven malignancy.       04/30/2017 Echocardiogram    Study Conclusions  - Left ventricle: The cavity size was normal. Wall thickness was   normal. Systolic function was normal. The estimated ejection   fraction was in the range of 60% to 65%. Wall motion was normal;   there were no regional wall motion abnormalities. Left   ventricular diastolic function parameters were normal. GLS:   -20.4%, RV S vel 12.1cm/s.       05/05/2017 Imaging    MRI BRAIN IMPRESSION: No intracranial or calvarial metastatic disease. Normal MRI of the brain.      05/07/2017 Imaging    CT CAP IMPRESSION: Two left breast masses and a grossly abnormal 3.9 cm lower left axillary lymph node, likely correspond to multifocal left breast cancer metastatic to the left axilla. Skin thickening of the left breast, raising concern for an inflammatory breast cancer.  1 cm hypoattenuated lesion in the posterior dome of the liver with internal attenuation slightly higher than water. This may represent a complicated liver cyst, hemangioma or a focus of metastatic disease.  No other findings to suggest distal metastatic disease.       05/07/2017 Imaging    BONE SCAN IMPRESSION: 1. No evidence of osseous metastases. 2. Abnormal uptake in the soft tissues of the left breast likely by the patient's known breast cancer.       05/11/2017 Genetic Testing    Common Cancers panel (47 genes) @ [facility] - No pathoagenic mutations detected Five Variants of Uncertain Significance were detected:  APC c.2438A>G (p.Asn813Ser)  BARD1 c.782T>C (p.Leu261Pro) MSH3 c.1655C>T (p.Thr552Ile)  MSH6 c.2108T>C (p.Met703Thr)  NTHL1 c.470G>C (p.Arg157Pro)   Genes Analyzed: 47 genes on [facility]'s Common Cancers panel (APC, ATM, AXIN2, BARD1, BMPR1A, BRCA1, BRCA2, BRIP1, CDH1, CDK4, CDKN2A, CHEK2, CTNNA1, DICER1, EPCAM, GREM1, HOXB13, KIT, MEN1, MLH1, MSH2, MSH3, MSH6, MUTYH, NBN, NF1, NTHL1, PALB2, PDGFRA, PMS2, POLD1, POLE, PTEN, RAD50, RAD51C, RAD51D, SDHA, SDHB, SDHC, SDHD, SMAD4, SMARCA4, STK11, TP53, TSC1, TSC2, VHL).       05/12/2017 -  Chemotherapy    1. neoadjuvant AC q2 weeks x4  cycles then weekly carbo/taxol for 12 weeks beginning 05/12/17.  2. Monthly zoladex injections starting 04/30/17 for the duration of chemotherapy.      CURRENT THERAPY:  1. Neoadjuvant AC q2 weeks x4 cycles then weekly carbo/taxol for 12 weeks beginning 05/12/17.  2.  Monthly zoladex injections starting 04/30/17 for the duration of chemotherapy.   INTERVAL HISTORY: Ms. Derryberry returns for follow-up as scheduled prior to cycle 4 AC.  She reports she is doing well today.  Has mild fatigue starting on the day of chemo lasting 5-6 days but able to function normally and remain active.  Experiences 2-3 days of severe bone pain especially back pain at night after Neulasta.  Has not been taking Claritin.  She has frequent mild nausea without emesis, she will take Zofran if it becomes severe.  Has not required medication in the last 3 days but does report mild nausea today. Has only tried Compazine one time without much relief. No constipation or diarrhea.  After cycle 2 she developed hot flashes that occur 3 times per day and last 1-2 minutes while she is awake.  They are becoming slightly more frequent but overall still tolerable.  She noticed mild intermittent numbness to fingertips since last cycle and darkening of her hands and nails.  Does not limit function or grip. Her mood is stable, anxiety has improved.   REVIEW OF SYSTEMS:   Constitutional: Denies fevers, chills or abnormal weight loss (+) mild fatigue days 1-6 with chemo (+) hot flashes 3 per day lasting 1-2 minutes while awake, tolerable Eyes: Denies blurriness of vision Ears, nose, mouth, throat, and face: Denies mucositis or sore throat Respiratory: Denies cough, dyspnea or wheezes Cardiovascular: Denies palpitation, chest discomfort or lower extremity swelling Gastrointestinal:  Denies vomiting, constipation, diarrhea, heartburn or change in bowel habits (+) frequent mild nausea, moderately controlled with zofran GU: Denies dysuria or hematuria  Skin: Denies abnormal skin rashes (+) darkening to hands, palms, and nails Lymphatics: Denies new lymphadenopathy or easy bruising Neurological:Denies tingling or new weaknesses (+) intermittent mild numbness to fingertips, not limiting function or  grip Behavioral/Psych: Mood is stable, no new changes (+) anxiety, improved MSK: (+) severe bone pain 2-3 days after neulasta, back pain All other systems were reviewed with the patient and are negative.  MEDICAL HISTORY:  Past Medical History:  Diagnosis Date  . Breast cancer ([place])   . Family history of cancer   . Genetic testing 04/29/2017   Common Cancers panel (47 genes) @ [facility] - No pathoagenic mutations detected    SURGICAL HISTORY: Past Surgical History:  Procedure Laterality Date  . IR FLUORO GUIDE PORT INSERTION RIGHT  05/03/2017  . IR US GUIDE VASC ACCESS RIGHT  05/03/2017    I have reviewed the social history and family history with the patient and they are unchanged from previous note.  ALLERGIES:  is allergic to heparin.  MEDICATIONS:  Current Outpatient Medications  Medication Sig Dispense Refill  . ibuprofen (ADVIL,MOTRIN) 200 MG tablet Take 2 tablets (400 mg total) by mouth every 6 (six) hours as needed. 30 tablet 0  . lidocaine-prilocaine (EMLA) cream Apply to affected area once 30 g 3  . ondansetron (ZOFRAN) 8 MG tablet Take 1 tablet (8 mg total) by mouth 2 (two) times daily as needed. Start on the third day after chemotherapy. 30 tablet 1  . prochlorperazine (COMPAZINE) 10 MG tablet Take 1 tablet (10 mg total) by mouth every 6 (six) hours as needed (Nausea or vomiting). 30 tablet  1  . ALPRAZolam (XANAX) 0.25 MG tablet Take 1 tablet (0.25 mg total) by mouth at bedtime as needed for anxiety. (Patient not taking: Reported on 06/10/2017) 30 tablet 0  . LORazepam (ATIVAN) 0.5 MG tablet Take 1 tablet (0.5 mg total) by mouth every 8 (eight) hours as needed (nausea, vomiting). 20 tablet 0   No current facility-administered medications for this visit.    Facility-Administered Medications Ordered in Other Visits  Medication Dose Route Frequency Provider Last Rate Last Dose  . sodium chloride flush (NS) 0.9 % injection 10 mL  10 mL Intracatheter PRN [name], MD   10 mL  at 06/24/17 1443    PHYSICAL EXAMINATION: ECOG PERFORMANCE STATUS: 1 - Symptomatic but completely ambulatory  Vitals:   06/24/17 1115  BP: 117/75  Pulse: 81  Resp: 20  Temp: 97.9 F (36.6 C)  SpO2: 100%   Filed Weights   06/24/17 1115  Weight: 201 lb 8 oz (91.4 kg)    GENERAL:alert, no distress and comfortable  SKIN: skin color, texture, turgor are normal, no rashes or significant lesions (+) hyperpigmentation to hands and palms  EYES: normal, Conjunctiva are pink and non-injected, sclera clear OROPHARYNX:no exudate, no erythema and lips, buccal mucosa, and tongue normal  LYMPH:  no palpable cervical, supraclavicular, or axillary lymphadenopathy LUNGS: clear to auscultation bilaterally with normal breathing effort HEART: regular rate & rhythm and no murmurs and no lower extremity edema ABDOMEN:abdomen soft, non-tender and normal bowel sounds Musculoskeletal:no cyanosis of digits and no clubbing (+) hyperpigmented nailbeds  NEURO: alert & oriented x 3 with fluent speech, no focal motor/sensory deficits BREASTS: (+) left lower outer quadrant mass not palpable now; left lower outer quadrant mass remains not palpable (+) no palpable adenopathy. Right breast benign.  PAC without erythema   LABORATORY DATA:  I have reviewed the data as listed CBC Latest Ref Rng & Units 06/24/2017 06/10/2017 05/27/2017  WBC 3.9 - 10.3 K/uL 20.4(H) 17.4(H) 13.7(H)  Hemoglobin 12.0 - 15.0 g/dL - - -  Hematocrit 34.8 - 46.6 % 31.0(L) 32.7(L) 37.2  Platelets 145 - 400 K/uL 210 159 248     CMP Latest Ref Rng & Units 06/24/2017 06/10/2017 05/27/2017  Glucose 70 - 140 mg/dL 96 95 107  BUN 7 - 26 mg/dL 11 7 9   Creatinine 0.60 - 1.10 mg/dL 0.69 0.73 0.80  Sodium 136 - 145 mmol/L 140 140 140  Potassium 3.5 - 5.1 mmol/L 4.1 3.9 4.0  Chloride 98 - 109 mmol/L 108 105 105  CO2 22 - 29 mmol/L 25 26 27   Calcium 8.4 - 10.4 mg/dL 9.4 9.5 9.5  Total Protein 6.4 - 8.3 g/dL 7.1 7.1 7.8  Total Bilirubin 0.2 - 1.2 mg/dL  0.3 0.2 <0.2(L)  Alkaline Phos 40 - 150 U/L 124 130 136  AST 5 - 34 U/L 17 18 42(H)  ALT 0 - 55 U/L 21 20 78(H)   Diagnosis 04/19/17 1. Breast, left, needle core biopsy, 4:00 o'clock, ribbon clip - INVASIVE DUCTAL CARCINOMA. - SEE COMMENT. 2. Breast, left, needle core biopsy, 2:00 o'clock, coil clip - INVASIVE DUCTAL CARCINOMA. - SEE COMMENT. 3. Lymph node, needle/core biopsy, left axilla, spiral hydromark - DUCTAL CARCINOMA. - SEE COMMENT. Microscopic Comment 1. The carcinoma in the three specimens is morphologically similar and is grade III. Lymph nodal tissue is not definitively identified in specimen #3. A breast prognostic profile will be performed on part 2 and the results reported separately. 2. FLUORESCENCE IN-SITU HYBRIDIZATION Results: HER2 - NEGATIVE RATIO  OF HER2/CEP17 SIGNALS 1.53 AVERAGE HER2 COPY NUMBER PER CELL 2.75 2. PROGNOSTIC INDICATORS Results: IMMUNOHISTOCHEMICAL AND MORPHOMETRIC ANALYSIS PERFORMED MANUALLY Estrogen Receptor: 0%, NEGATIVE Progesterone Receptor: 0%, NEGATIVE Proliferation Marker Ki67: 80% COMMENT: The negative hormone receptor study(ies) in this case has An internal positive control.    RADIOGRAPHIC STUDIES: I have personally reviewed the radiological images as listed and agreed with the findings in the report. No results found.   ASSESSMENT & PLAN: Carrie Miller a 22 y.o. 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 Barsch appears stable today. She has completed 3 cycles of neoadjuvant AC with Neulasta.  She is tolerating moderately well; with mild, neuropathy, worsening nausea, and bone pain.  Has not been using  Claritin, I reviewed dosing instructions to be taken starting from the day of Neulasta for 5 days.  She may take Tylenol or NSAIDs with Claritin for pain. VS and weight stable.  Labs reviewed, CMP normal, CBC stable; she has mild anemia, Hgb 10.4.  She is asymptomatic.  She does not require transfusion at this time.  Leukocytosis likely due to Neulasta injection.  Left upper outer and left lower outer quadrant breast masses are not palpable on today's exam, indicating good response to therapy.  Will continue to monitor while on neoadjuvant therapy.  She will proceed with cycle 4 AC today, return in 2 weeks for follow-up and to begin first cycle of weekly carbo/Taxol.    2. Nausea without vomiting -She has increasing nausea, previously lasted 3-4 days after chemo now occurring almost daily. Incompletely controlled by Zofran. P.o. intake has not been affected. She tried Compazine in the past.  Reviewed she can use Compazine within the first 3 days after chemo due to [place] premed, then may add an alternate with Zofran after day 3.  I prescribed Ativan for her to use if Zofran and Compazine are not effective.  She gets emend premedication.  Will monitor.  3. Hot flashes, secondary to zoladex and possibly chemotherapy  -She developed hot flashes after cycle 2, occurring 3 times per day lasting 1-2 minutes each.  Tolerable overall.  She is not interested in additional medication to manage this symptom.  Will monitor.  4. Peripheral neuropathy, G1, secondary to chemotherapy  -She developed mild intermittent numbness to fingertips after cycle 3 AC.  Function is not limited.  No sensory deficits.  Reviewed that this can worsen on Taxol/carbo regimen and will monitor closely.  If neuropathy should get worse on further chemo I discussed possible need to reduce dose or amend her treatment plan. She understands.  5. Skin/nail toxicity, secondary to chemotherapy  -nails are intact, no skin breakdown. I encouraged her  to keep her hands moisturized and soak fingernails in vinegar and water as needed. Will monitor  6. Headaches, dizziness -None recently   7. Anxiety -Stable.  8. Social issues -She has met with SW, breast navigator, and survivors for moral support and ongoing needs.   9. Genetics  - negative for pathogenic mutation on 05/11/17  10. Fertility planning  - on monthly zoladex for duration of chemo to reduce risk of chemo-induced infertility   11. Irregular vaginal bleeding -Cervix biopsy positive for atypical squamous metaplasia associated with inflammation, endocervix biopsy with benign endocervical mucosa on  05/10/2017. Continue to f/u with GYN.   PLAN -zoladex today, continue monthly on chemo -Labs reviewed, proceed with cycle 4 AC, neulasta on day 3 -Prescription for cranial prosthesis, ativan PRN for nausea  -Reviewed instructions for Claritin, Compazine, Zofran, and Ativan -Nail soaks PRN -Return in 2 weeks for follow-up with Dr. Burr Medico and cycle 1 weekly carbo/Taxol  All questions were answered. The patient knows to call the clinic with any problems, questions or concerns. No barriers to learning was detected. I spent 25 minutes counseling the patient face to face. The total time spent in the appointment was 30 minutes and more than 50% was on counseling and review of test results     [name], NP 06/24/17

## 2017-06-26 ENCOUNTER — Inpatient Hospital Stay: Payer: Medicaid Other

## 2017-06-26 VITALS — BP 119/60 | HR 79 | Temp 98.0°F | Resp 18

## 2017-06-26 DIAGNOSIS — C50812 Malignant neoplasm of overlapping sites of left female breast: Secondary | ICD-10-CM

## 2017-06-26 DIAGNOSIS — Z5111 Encounter for antineoplastic chemotherapy: Secondary | ICD-10-CM | POA: Diagnosis not present

## 2017-06-26 MED ORDER — PEGFILGRASTIM INJECTION 6 MG/0.6ML ~~LOC~~
6.0000 mg | PREFILLED_SYRINGE | Freq: Once | SUBCUTANEOUS | Status: AC
  Administered 2017-06-26: 6 mg via SUBCUTANEOUS

## 2017-06-26 MED ORDER — PEGFILGRASTIM INJECTION 6 MG/0.6ML ~~LOC~~
PREFILLED_SYRINGE | SUBCUTANEOUS | Status: AC
  Filled 2017-06-26: qty 0.6

## 2017-06-26 NOTE — Patient Instructions (Signed)
Pegfilgrastim injection What is this medicine? PEGFILGRASTIM (PEG fil gra stim) is a long-acting granulocyte colony-stimulating factor that stimulates the growth of neutrophils, a type of white blood cell important in the body's fight against infection. It is used to reduce the incidence of fever and infection in patients with certain types of cancer who are receiving chemotherapy that affects the bone marrow, and to increase survival after being exposed to high doses of radiation. This medicine may be used for other purposes; ask your health care provider or pharmacist if you have questions. COMMON BRAND NAME(S): Neulasta What should I tell my health care provider before I take this medicine? They need to know if you have any of these conditions: -kidney disease -latex allergy -ongoing radiation therapy -sickle cell disease -skin reactions to acrylic adhesives (On-Body Injector only) -an unusual or allergic reaction to pegfilgrastim, filgrastim, other medicines, foods, dyes, or preservatives -pregnant or trying to get pregnant -breast-feeding How should I use this medicine? This medicine is for injection under the skin. If you get this medicine at home, you will be taught how to prepare and give the pre-filled syringe or how to use the On-body Injector. Refer to the patient Instructions for Use for detailed instructions. Use exactly as directed. Tell your healthcare provider immediately if you suspect that the On-body Injector may not have performed as intended or if you suspect the use of the On-body Injector resulted in a missed or partial dose. It is important that you put your used needles and syringes in a special sharps container. Do not put them in a trash can. If you do not have a sharps container, call your pharmacist or healthcare provider to get one. Talk to your pediatrician regarding the use of this medicine in children. While this drug may be prescribed for selected conditions,  precautions do apply. Overdosage: If you think you have taken too much of this medicine contact a poison control center or emergency room at once. NOTE: This medicine is only for you. Do not share this medicine with others. What if I miss a dose? It is important not to miss your dose. Call your doctor or health care professional if you miss your dose. If you miss a dose due to an On-body Injector failure or leakage, a new dose should be administered as soon as possible using a single prefilled syringe for manual use. What may interact with this medicine? Interactions have not been studied. Give your health care provider a list of all the medicines, herbs, non-prescription drugs, or dietary supplements you use. Also tell them if you smoke, drink alcohol, or use illegal drugs. Some items may interact with your medicine. This list may not describe all possible interactions. Give your health care provider a list of all the medicines, herbs, non-prescription drugs, or dietary supplements you use. Also tell them if you smoke, drink alcohol, or use illegal drugs. Some items may interact with your medicine. What should I watch for while using this medicine? You may need blood work done while you are taking this medicine. If you are going to need a MRI, CT scan, or other procedure, tell your doctor that you are using this medicine (On-Body Injector only). What side effects may I notice from receiving this medicine? Side effects that you should report to your doctor or health care professional as soon as possible: -allergic reactions like skin rash, itching or hives, swelling of the face, lips, or tongue -dizziness -fever -pain, redness, or irritation at site   where injected -pinpoint red spots on the skin -red or dark-brown urine -shortness of breath or breathing problems -stomach or side pain, or pain at the shoulder -swelling -tiredness -trouble passing urine or change in the amount of urine Side  effects that usually do not require medical attention (report to your doctor or health care professional if they continue or are bothersome): -bone pain -muscle pain This list may not describe all possible side effects. Call your doctor for medical advice about side effects. You may report side effects to FDA at 1-800-FDA-1088. Where should I keep my medicine? Keep out of the reach of children. Store pre-filled syringes in a refrigerator between 2 and 8 degrees C (36 and 46 degrees F). Do not freeze. Keep in carton to protect from light. Throw away this medicine if it is left out of the refrigerator for more than 48 hours. Throw away any unused medicine after the expiration date. NOTE: This sheet is a summary. It may not cover all possible information. If you have questions about this medicine, talk to your doctor, pharmacist, or health care provider.  2018 Elsevier/Gold Standard (2016-04-02 12:58:03)  

## 2017-07-01 ENCOUNTER — Encounter: Payer: Self-pay | Admitting: General Practice

## 2017-07-02 ENCOUNTER — Encounter: Payer: Self-pay | Admitting: General Practice

## 2017-07-02 NOTE — Progress Notes (Signed)
[facility] CSW Progress Notes  At request of [employer], letter prepared to support sister's request for accommodations at work to allow her to function as caregiver for patient undergoing treatment.  Spoke w sister - states patient would like letter left w [employer] so they can pick up at next appointment at [facility].  Edwyna Shell, LCSW Clinical Social Worker Phone:  [phone]

## 2017-07-06 NOTE — Progress Notes (Signed)
[place]  [phone] [phone]  Clinic Follow Up Note   Patient Care Team: Patient, No Pcp Per as PCP - General ([place]) [name], MD as Consulting Physician (General Surgery) [name], MD as Consulting Physician (Hematology) [name], NP as Nurse Practitioner (Nurse Practitioner)   Date of Service:  07/08/2017  CHIEF COMPLAINTS:  Follow up left breast cancer, triple negative     Cancer of overlapping sites of left breast ([place])   04/15/2017 Mammogram    IMPRESSION: 1. Highly suspicious irregular mass within the left breast at the 4 o'clock axis, 8 cm from the nipple, measuring 5.2 cm, corresponding to the area of clinical concern. Ultrasound-guided biopsy is recommended. 2. Additional suspicious irregular mass within the left breast at the 2 o'clock axis, 8 cm from the nipple, measuring 1.6 cm, corresponding to the mammographic finding. Ultrasound-guided biopsy is recommended. 3. Additional suspicious mass within the left breast at the 2 o'clock axis, 10 cm from the nipple, axillary tail region, measuring 3 cm, suspected lymph node completely replaced by tumor. Ultrasound-guided biopsy is recommended. 4. No evidence of malignancy within the right breast.       04/15/2017 Breast US    Left breast: Targeted ultrasound is performed, showing an irregular mass in the left breast at the 4 o'clock axis, 8 cm from the nipple, measuring 5.2 x 4.3 x 4.3 cm, with internal vascularity, corresponding to the mammographic finding and palpable lump.  There is an additional irregular hypoechoic mass in the left breast at the 2 o'clock axis, 8 cm from the nipple, measuring 1.6 x 1.3 x 1.3 cm, with internal vascularity, corresponding to the additional mass seen within the outer left breast on mammogram, suspected satellite mass.  Lastly, there is an oval circumscribed hypoechoic mass in the left breast at the 2 o'clock  axis, 10 cm from the nipple, axillary tail region, with heterogeneous echotexture, measuring 3 x 2.3 x 2.8 cm, suspected lymph node completely replaced by tumor.  Remainder of the left axilla was evaluated with ultrasound showing no additional enlarged or morphologically abnormal lymph nodes.  IMPRESSION: 1. Highly suspicious irregular mass within the left breast at the 4 o'clock axis, 8 cm from the nipple, measuring 5.2 cm, corresponding to the area of clinical concern. Ultrasound-guided biopsy is recommended. 2. Additional suspicious irregular mass within the left breast at the 2 o'clock axis, 8 cm from the nipple, measuring 1.6 cm, corresponding to the mammographic finding. Ultrasound-guided biopsy is recommended. 3. Additional suspicious mass within the left breast at the 2 o'clock axis, 10 cm from the nipple, axillary tail region, measuring 3 cm, suspected lymph node completely replaced by tumor. Ultrasound-guided biopsy is recommended. 4. No evidence of malignancy within the right breast.       04/19/2017 Initial Biopsy    Diagnosis 1. Breast, left, needle core biopsy, 4:00 o'clock, ribbon clip - INVASIVE DUCTAL CARCINOMA. - SEE COMMENT. 2. Breast, left, needle core biopsy, 2:00 o'clock, coil clip - INVASIVE DUCTAL CARCINOMA. - SEE COMMENT. 3. Lymph node, needle/core biopsy, left axilla, spiral hydromark - DUCTAL CARCINOMA. - SEE COMMENT.  2. PROGNOSTIC INDICATORS Results: IMMUNOHISTOCHEMICAL AND MORPHOMETRIC ANALYSIS PERFORMED MANUALLY Estrogen Receptor: 0%, NEGATIVE Progesterone Receptor: 0%, NEGATIVE Proliferation Marker Ki67: 80%  2. FLUORESCENCE IN-SITU HYBRIDIZATION Results: HER2 - NEGATIVE RATIO OF HER2/CEP17 SIGNALS 1.53 AVERAGE HER2 COPY NUMBER PER CELL 2.75  Microscopic Comment 1. The carcinoma in the three specimens is morphologically similar and is grade III. Lymph nodal tissue  is not definitively identified in specimen #3. A breast prognostic  profile will be performed on part 2 and the results reported separately.      04/28/2017 Initial Diagnosis    Cancer of overlapping sites of left breast ([place])      04/30/2017 Breast MRI    IMPRESSION: 1. Biopsy-proven invasive carcinoma within the lower outer quadrant of the LEFT breast, 4 o'clock axis, at posterior depth, measuring 4.8 cm, with associated biopsy clip artifact. 2. Biopsy-proven invasive carcinoma within the upper-outer quadrant of the LEFT breast, 2 o'clock axis, at posterior depth, measuring 2.5 cm, with associated biopsy clip artifact. However, contiguous non mass enhancement along the posterior margin of this 2 o'clock mass and extending 3 cm anteriorly from the mass increases the overall measurement to 5.5 cm greatest dimension (AP). 3. Diffuse edema throughout the LEFT breast, with particularly prominent component of edema extending posteriorly to abut the anterior surface of the pectoralis muscle, and diffuse skin thickening throughout the left breast. This almost certainly indicates INFLAMMATORY BREAST CANCER. 4. Biopsy-proven metastatic lymph node within the LEFT axilla measures 4.1 cm. Two additional borderline prominent lymph nodes within the left axilla. No enlarged lymph nodes within the right axilla or internal mammary chain regions. 5. No evidence of malignancy within the RIGHT breast.  RECOMMENDATION: Per current treatment plan for patient's known left breast cancer (2 biopsy-proven sites) and metastatic lymph node in the left axilla.  BI-RADS CATEGORY  6: Known biopsy-proven malignancy.       04/30/2017 Echocardiogram    Study Conclusions  - Left ventricle: The cavity size was normal. Wall thickness was   normal. Systolic function was normal. The estimated ejection   fraction was in the range of 60% to 65%. Wall motion was normal;   there were no regional wall motion abnormalities. Left   ventricular diastolic function parameters were  normal. GLS:   -20.4%, RV S vel 12.1cm/s.      05/05/2017 Imaging    MRI BRAIN IMPRESSION: No intracranial or calvarial metastatic disease. Normal MRI of the brain.      05/07/2017 Imaging    CT CAP IMPRESSION: Two left breast masses and a grossly abnormal 3.9 cm lower left axillary lymph node, likely correspond to multifocal left breast cancer metastatic to the left axilla. Skin thickening of the left breast, raising concern for an inflammatory breast cancer.  1 cm hypoattenuated lesion in the posterior dome of the liver with internal attenuation slightly higher than water. This may represent a complicated liver cyst, hemangioma or a focus of metastatic disease.  No other findings to suggest distal metastatic disease.       05/07/2017 Imaging    BONE SCAN IMPRESSION: 1. No evidence of osseous metastases. 2. Abnormal uptake in the soft tissues of the left breast likely by the patient's known breast cancer.       05/11/2017 Genetic Testing    Common Cancers panel (47 genes) @ [facility] - No pathoagenic mutations detected Five Variants of Uncertain Significance were detected:  APC c.2438A>G (p.Asn813Ser)  BARD1 c.782T>C (p.Leu261Pro) MSH3 c.1655C>T (p.Thr552Ile)  MSH6 c.2108T>C (p.Met703Thr)  NTHL1 c.470G>C (p.Arg157Pro)   Genes Analyzed: 47 genes on [facility]'s Common Cancers panel (APC, ATM, AXIN2, BARD1, BMPR1A, BRCA1, BRCA2, BRIP1, CDH1, CDK4, CDKN2A, CHEK2, CTNNA1, DICER1, EPCAM, GREM1, HOXB13, KIT, MEN1, MLH1, MSH2, MSH3, MSH6, MUTYH, NBN, NF1, NTHL1, PALB2, PDGFRA, PMS2, POLD1, POLE, PTEN, RAD50, RAD51C, RAD51D, SDHA, SDHB, SDHC, SDHD, SMAD4, SMARCA4, STK11, TP53, TSC1, TSC2, VHL).  05/12/2017 -  Chemotherapy    1. neoadjuvant AC q2 weeks x4 cycles then weekly carbo/taxol for 12 weeks beginning 05/12/17.  2. Monthly zoladex injections starting 04/30/17 for the duration of chemotherapy.        HISTORY OF PRESENTING ILLNESS: 04/28/17 Carrie Miller 22 y.o.  female is here because of newly diagnosed left breast cancer. She was referred by Dr. Barry Dienes. She presents with her brother and his wife; she speaks minimal [place], her family interpreted for her as she refused interpreter services. She initially palpated a left breast mass 6 weeks ago that was painful with breast swelling and felt to be enlarging. Has not had prior mammogram. Denies nipple inversion, discharge, or skin dimpling. She went to [facility] urgent care and was then referred to the breast center for imaging and diagnostics. Diagnostic mammogram showed a dominant mass in the left breast at the 4 o'clock axis, 8 cm from the nipple, measuring 5.2 x 4.3 x 4.3 cm; a satellite mass in the left breast at the 2 o'clock axis, 8 cm from the nipple, measuring 1.6 x 1.3 x 1.3 cm; and a circumscribed hypoechoic mass in the left breast at the 2 o'clock axis, 10 cm from the nipple, axillary tail measuring 3 x 2.3 x 2.8 cm, suspected lymph node completely replaced by tumor.  Ultrasound-guided biopsy of all 3 masses were positive for invasive ductal carcinoma, ER/PR negative, HER-2 negative, grade 3.    In addition she reports 1 month history of frequent daily headaches with associated dizziness and occasional left eye pain. Pain often wakes her up from sleep, has tried ibuprofen with some relief. Pain usually at the crown of her head, average 5/10 - 9/10 on pain scale. Not sensitive to light or sound. Denies fall. Reports fatigue for 1 week, denies weight loss, decreased appetite, abdominal pain. Over last 2 months she has irregular vaginal bleeding, bleeding 20 days out of the last month. Recently had abnormal PAP smear, colposcopy is pending. She feels very anxious about her diagnosis.  She has no significant past medical history. Her father has prostate cancer diagnosed at age 49. Paternal aunt had "abdominal cancer." a paternal uncle also had prostate cancer diagnosed age 16. Negative family history of breast or GYN  cancer. She lives with her family in a home with her parents, brother, and his brother's wife. She does not work or drive. She lived in [place] and moved to [place] 9 months ago. She has permanent resident card, no health insurance. Has been getting assistance with BCCCP program through [employer].  GYN HISTORY  Menarchal: 7 LMP: currently menstruating  Contraceptive: none HRT: none GP: G0    CURRENT THERAPY:  1. Neoadjuvant AC q2 weeks x4 cycles then weekly beginning 05/12/17 followed by carbo/taxol for 12 weeks starting 07/08/17  2. Monthly zoladex injections starting 04/30/17 for the duration of chemotherapy.   INTERVAL HISTORY:  Carrie Miller is here for a follow up. She presents to the clinic today accompanied by her brother who helps translate for her. She notes she is overall tolerating chemotherapy. She is still able to manage her daily activities and still eating well.   On review of symptoms, pt denies neuropathy but will feel dizzy at times, especially when she stands occasionally. She has not been drinking much water. She notes her face will get hot and red with hot flashes. She will sweat at times and will last only a few minutes. She notes when she eats it is  hard for her to swallow and her mouth and through is dry.    MEDICAL HISTORY:  Past Medical History:  Diagnosis Date  . Breast cancer ([place])   . Family history of cancer   . Genetic testing 04/29/2017   Common Cancers panel (47 genes) @ [facility] - No pathoagenic mutations detected    SURGICAL HISTORY: Past Surgical History:  Procedure Laterality Date  . IR FLUORO GUIDE PORT INSERTION RIGHT  05/03/2017  . IR US GUIDE VASC ACCESS RIGHT  05/03/2017    SOCIAL HISTORY: Social History   Socioeconomic History  . Marital status: Single    Spouse name: Not on file  . Number of children: Not on file  . Years of education: Not on file  . Highest education level: Not on file  Occupational History  .  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 Financial resource strain: Not on file  . Food insecurity:    Worry: Not on file    Inability: Not on file  . Transportation needs:    Medical: Not on file    Non-medical: Not on file  Tobacco Use  . Smoking status: Never Smoker  . Smokeless tobacco: Never Used  Substance and Sexual Activity  . Alcohol use: No    Frequency: Never  . Drug use: No  . Sexual activity: Never    Birth control/protection: None  Lifestyle  . Physical activity:    Days per week: 3 days    Minutes per session: 30 min  . Stress: Not at all  Relationships  . Social connections:    Talks on phone: More than three times a week    Gets together: More than three times a week    Attends religious service: 1 to 4 times per year    Active member of club or organization: No    Attends meetings of clubs or organizations: Never    Relationship status: Never married  . Intimate partner violence:    Fear of current or ex partner: No    Emotionally abused: No    Physically abused: No    Forced sexual activity: No  Other Topics Concern  . Not on file  Social History Narrative  . Not on file    FAMILY HISTORY: Family History  Problem Relation Age of Onset  . Hyperlipidemia Mother   . Hypertension Mother   . Prostate cancer Father 96       currently 37  . Stomach cancer Paternal Aunt 12       deceased 65s  . Prostate cancer Paternal Uncle 16       deceased 74    ALLERGIES:  is allergic to heparin.  MEDICATIONS:  Current Outpatient Medications  Medication Sig Dispense Refill  . ibuprofen (ADVIL,MOTRIN) 200 MG tablet Take 2 tablets (400 mg total) by mouth every 6 (six) hours as needed. 30 tablet 0  . lidocaine-prilocaine (EMLA) cream Apply to affected area once 30 g 3  . LORazepam (ATIVAN) 0.5 MG tablet Take 1 tablet (0.5 mg total) by mouth every 8 (eight) hours as needed (nausea, vomiting). 20 tablet 0  . ondansetron (ZOFRAN) 8 MG tablet Take 1 tablet (8 mg  total) by mouth 2 (two) times daily as needed. Start on the third day after chemotherapy. 30 tablet 1  . prochlorperazine (COMPAZINE) 10 MG tablet Take 1 tablet (10 mg total) by mouth every 6 (six) hours as needed (Nausea or vomiting). 30 tablet 1  . ALPRAZolam Duanne Moron)  0.25 MG tablet Take 1 tablet (0.25 mg total) by mouth at bedtime as needed for anxiety. (Patient not taking: Reported on 07/08/2017) 30 tablet 0   No current facility-administered medications for this visit.    Facility-Administered Medications Ordered in Other Visits  Medication Dose Route Frequency Provider Last Rate Last Dose  . sodium chloride flush (NS) 0.9 % injection 10 mL  10 mL Intracatheter PRN [name], MD   10 mL at 07/08/17 1346    REVIEW OF SYSTEMS:   Constitutional: Denies fevers, chills or abnormal night sweats (+) hot flash (+) fatigue  Eyes: Denies blurriness of vision, double vision or watery eyes Ears, nose, mouth, throat, and face: Denies mucositis or sore throat (+) dry mouth and mild Odynophagia Respiratory: Denies cough, dyspnea or wheezes Cardiovascular: Denies palpitation, chest discomfort or lower extremity swelling (+) occasional dizziness upon standing Gastrointestinal:  Denies heartburn or change in bowel habits (+) mild nausea, manageable Skin: Denies abnormal skin rashes Lymphatics: Denies new lymphadenopathy or easy bruising Neurological:Denies numbness, tingling or new weaknesses Behavioral/Psych: Mood is stable, no new changes  All other systems were reviewed with the patient and are negative.  PHYSICAL EXAMINATION: ECOG PERFORMANCE STATUS: 1 - Symptomatic but completely ambulatory  Vitals:   07/08/17 0946  BP: 115/82  Pulse: 92  Resp: 18  Temp: 98 F (36.7 C)  SpO2: 100%   Filed Weights   07/08/17 0946  Weight: 200 lb 11.2 oz (91 kg)    GENERAL:alert, no distress and comfortable SKIN: skin color, texture, turgor are normal, no rashes or significant lesions EYES: normal,  conjunctiva are pink and non-injected, sclera clear OROPHARYNX:no exudate, no erythema and lips, buccal mucosa, and tongue normal  NECK: supple, thyroid normal size, non-tender, without nodularity LYMPH:  no palpable lymphadenopathy in the cervical, axillary or inguinal LUNGS: clear to auscultation and percussion with normal breathing effort HEART: regular rate & rhythm and no murmurs and no lower extremity edema ABDOMEN:abdomen soft, non-tender and normal bowel sounds Musculoskeletal:no cyanosis of digits and no clubbing  PSYCH: alert & oriented x 3 with fluent speech NEURO: no focal motor/sensory deficits BREAST: (+) Left Lower Quadrant mass is now no longer palpable. Left Outer Quadrant mass is not palpable anymore. (+) No palpable lymph nodes. Right breast exam benign    LABORATORY DATA:  I have reviewed the data as listed CBC Latest Ref Rng & Units 07/08/2017 06/24/2017 06/10/2017  WBC 3.9 - 10.3 K/uL 14.7(H) 20.4(H) 17.4(H)  Hemoglobin 12.0 - 15.0 g/dL - - -  Hematocrit 34.8 - 46.6 % 28.9(L) 31.0(L) 32.7(L)  Platelets 145 - 400 K/uL 196 210 159    CMP Latest Ref Rng & Units 07/08/2017 06/24/2017 06/10/2017  Glucose 70 - 140 mg/dL 107 96 95  BUN 7 - 26 mg/dL 10 11 7   Creatinine 0.60 - 1.10 mg/dL 0.71 0.69 0.73  Sodium 136 - 145 mmol/L 140 140 140  Potassium 3.5 - 5.1 mmol/L 4.0 4.1 3.9  Chloride 98 - 109 mmol/L 108 108 105  CO2 22 - 29 mmol/L 24 25 26   Calcium 8.4 - 10.4 mg/dL 9.7 9.4 9.5  Total Protein 6.4 - 8.3 g/dL 7.1 7.1 7.1  Total Bilirubin 0.2 - 1.2 mg/dL 0.3 0.3 0.2  Alkaline Phos 40 - 150 U/L 120 124 130  AST 5 - 34 U/L 19 17 18   ALT 0 - 55 U/L 20 21 20    PATHOLOGY  Diagnosis 05/10/17 1. Cervix, biopsy, 11 o'clock - ATYPICAL SQUAMOUS METAPLASIA ASSOCIATED WITH INFLAMMATION. -  SEE COMMENT. 2. Endocervix, curettage - DETACHED FRAGMENTS OF SQUAMOUS MUCOSA WITH SLIGHT ATYPIA. - BENIGN ENDOCERVICAL MUCOSA. - SEE COMMENT. Microscopic Comment 1. The sections show  multiple fragments of mostly metaplastic squamous mucosa displaying prominent chronic and acute inflammation to a lesser extent. This is associated with epithelial atypia in the form of nuclear enlargement, and small nucleoli. The inflammatory process obscures the epithelium which is somewhat difficult to evaluate. A p16 stain was performed and is negative. Overall, definitive or diagnostic squamous intraepithelial lesion is not appreciated in this setting, and the changes may represent a florid reactive state. Nonetheless, clinical correlation and follow up is strongly recommended. 2. The sections show multiple detached fragments of squamous mucosa, some of which display slight atypia. This is admixed with multiple fragments of benign endocervical mucosa. A p16 stain was performed and is negative. The overall changes are not specific or diagnostic of a squamous intraepithelial lesion. Clinical correlation and follow up is recommended. (BNS:ah 05/12/17)  GYNECOLOGIC CYTOLOGY REPORT Adequacy Reason 05/10/17 Satisfactory for evaluation, endocervical/transformation zone component PRESENT. Diagnosis NEGATIVE FOR INTRAEPITHELIAL LESIONS OR MALIGNANCY. BENIGN REACTIVE/REPARATIVE CHANGES.    Diagnosis 04/19/17 1. Breast, left, needle core biopsy, 4:00 o'clock, ribbon clip - INVASIVE DUCTAL CARCINOMA. - SEE COMMENT. 2. Breast, left, needle core biopsy, 2:00 o'clock, coil clip - INVASIVE DUCTAL CARCINOMA. - SEE COMMENT. 3. Lymph node, needle/core biopsy, left axilla, spiral hydromark - DUCTAL CARCINOMA. - SEE COMMENT. Microscopic Comment 1. The carcinoma in the three specimens is morphologically similar and is grade III. Lymph nodal tissue is not definitively identified in specimen #3. A breast prognostic profile will be performed on part 2 and the results reported separately. 2. FLUORESCENCE IN-SITU HYBRIDIZATION Results: HER2 - NEGATIVE RATIO OF HER2/CEP17 SIGNALS 1.53 AVERAGE HER2 COPY  NUMBER PER CELL 2.75 2. PROGNOSTIC INDICATORS Results: IMMUNOHISTOCHEMICAL AND MORPHOMETRIC ANALYSIS PERFORMED MANUALLY Estrogen Receptor: 0%, NEGATIVE Progesterone Receptor: 0%, NEGATIVE Proliferation Marker Ki67: 80% COMMENT: The negative hormone receptor study(ies) in this case has An internal positive control.    RADIOGRAPHIC STUDIES: I have personally reviewed the radiological images as listed and agreed with the findings in the report. No results found.  ASSESSMENT & PLAN:  Carrie Miller a 22 y.o. 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  -She presented with a palpable left breast mass, measures 5.2 cm on ultrasound, with positive lymph node. Breast tumor biopsy showed triple negative breast ductal carcinoma. -Her staging CT and bone scan was negative for distant metastasis. -She was seen by Dr. Barry Dienes on 1/7 who feels she is potentially a candidate for breast conserving surgery but more likely will need at least left mastectomy if not bilateral mastectomies due to her young age, tumor size, and risk of recurrence.   -Given her clinical stage IIIC, triple negative disease, I recommended neoadjuvant regimen consisting of AC every 2 weeks for 4 cycles+ carboplatin/Taxol weekly for 12 cycles, followed by surgery then radiation. Side effects were discussed in great detail with her. She is agreed to proceed.  --Baseline echo on 04/30/17 normal with EF 60-65%.  -Genetic testing was negative for pathogenic mutation. 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  She will continue monthly zoladex injections for the duration of chemotherapy to suppress ovarian function; first dose given 1/11. -She started [facility] on 05/12/17 with neulasta injection on day 3. She has been tolerating well  -Labs reviewed, Hg 9.5 but no need for blood transfusion. Overall labs adequate to proceed with first cycle carbo/Taxol today -I strongly encouraged her to drink plenty of water and continue to eat well.  -Her masses are no longer palpable upon exam. I discussed after chemo we will do repeat breast MRI and we will review her options for lumpectomy over mastectomy.  -I suggested she should consult with a plastic surgeon before completing chemo if she is considering a mastectomy.  -F/u next week then every other week    2.  Headaches, dizziness -She has 1 month history of daily headaches with associated dizziness, pain ranges from 5/10 - 9/10 on the crown of her head, ibuprofen helps some; she can continue this or take tylenol PRN, neuro exam was unremarkable -Given the aggressive nature of triple negative disease, I obtained brain MRI on 05/05/17 which showed no evidence of brain mets.  -Will monitor while on chemo -Headaches resolved, has occasional dizziness upon standing. Encouraged her to stay well hydrated. Will monitor.   3.  Anxiety  -She has considerable anxiety about her new diagnosis and treatment plan  -I previously prescribed low dose xanax for her to take PRN for anxiety especially at night if she has difficulty sleeping -Stable   4.  Social issues -She lives with her family and has their support; does not work or drive -She does not currently have Scientist, product/process development, has been using Counselling psychologist -We have included breast Engineer, site, social work, Clinical biochemist, and financial advocate for moral support and ongoing needs.   5.  Genetics  -Due to her young age and family history of prostate  cancer, she has been referred to genetics -05/11/17 Genetic testing was negative for pathogenic mutation  6. Fertility planning  -We discussed the risk of infertility secondary to chemotherapy, she wishes to preserve fertility  -I have referred her to [facility] fertility specialist, initial appointment was on 1/11  -After discussion between the patient and her mother they decided not to pursue fertility preservation through [facility] -We have started her on Zoladex injection on 04/30/17, which hopefully will help to reduce the risk of chemo-induced infertility. -Continue monthly Zoladex injections    7. Irregular vaginal bleeding -She has 2 month history of irregular vaginal bleeding when she was diagnosed with breast cancer -04/15/18 PAP with atypical squamous cells.  -She underwent colposcopy on 05/10/17 with D&C and results were negative for malignancy but with abnormal pap smear of cervix.  -Will continue to follow up with GYN.    8. Hot flashes, secondary to zoladex and possibly chemotherapy  -She developed hot flashes after cycle 2, occurring 3 times per day lasting 1-2 minutes each.  -She is not interested in additional medication to manage this symptom. Will monitor -Tolerable overall.     9. Peripheral neuropathy, G1, secondary to chemotherapy  -She developed mild intermittent numbness to fingertips after cycle 3 AC.  Function is not limited.  No sensory deficits.   -previously reviewed that this can worsen on Taxol/carbo regimen and will monitor closely.  -If neuropathy should get worse on further chemo I discussed possible need to reduce dose or amend her treatment plan. She understands.    PLAN:   -Labs adequate  to proceed with chemo cycle 1 TAxol today, will add carbo from next cycle  -Lab, flush, f/u and TC in 1 week      No orders of the defined types were placed in this encounter.   All questions were answered. The patient knows to call the clinic with any problems,  questions or concerns. I spent 20 minutes counseling the patient face to face. The total time spent in the appointment was 25 minutes and more than 50% was on counseling.     [name], MD 07/08/2017 1:55 PM  This document serves as a record of services personally performed by [name], MD. It was created on her behalf by Joslyn Devon, a trained medical scribe. The creation of this record is based on the scribe's personal observations and the provider's statements to them.   I have reviewed the above documentation for accuracy and completeness, and I agree with the above.

## 2017-07-08 ENCOUNTER — Inpatient Hospital Stay: Payer: Medicaid Other

## 2017-07-08 ENCOUNTER — Encounter: Payer: Self-pay | Admitting: Hematology

## 2017-07-08 ENCOUNTER — Inpatient Hospital Stay (HOSPITAL_BASED_OUTPATIENT_CLINIC_OR_DEPARTMENT_OTHER): Payer: Medicaid Other | Admitting: Hematology

## 2017-07-08 VITALS — BP 117/86 | HR 85 | Temp 97.8°F | Resp 17

## 2017-07-08 VITALS — BP 115/82 | HR 92 | Temp 98.0°F | Resp 18 | Ht 64.0 in | Wt 200.7 lb

## 2017-07-08 DIAGNOSIS — R42 Dizziness and giddiness: Secondary | ICD-10-CM

## 2017-07-08 DIAGNOSIS — Z808 Family history of malignant neoplasm of other organs or systems: Secondary | ICD-10-CM

## 2017-07-08 DIAGNOSIS — C773 Secondary and unspecified malignant neoplasm of axilla and upper limb lymph nodes: Secondary | ICD-10-CM | POA: Diagnosis not present

## 2017-07-08 DIAGNOSIS — R51 Headache: Secondary | ICD-10-CM

## 2017-07-08 DIAGNOSIS — C50812 Malignant neoplasm of overlapping sites of left female breast: Secondary | ICD-10-CM

## 2017-07-08 DIAGNOSIS — F419 Anxiety disorder, unspecified: Secondary | ICD-10-CM | POA: Diagnosis not present

## 2017-07-08 DIAGNOSIS — N951 Menopausal and female climacteric states: Secondary | ICD-10-CM

## 2017-07-08 DIAGNOSIS — Z5111 Encounter for antineoplastic chemotherapy: Secondary | ICD-10-CM | POA: Diagnosis not present

## 2017-07-08 DIAGNOSIS — G620 Drug-induced polyneuropathy: Secondary | ICD-10-CM | POA: Diagnosis not present

## 2017-07-08 LAB — CBC WITH DIFFERENTIAL (CANCER CENTER ONLY)
BASOS ABS: 0.1 10*3/uL (ref 0.0–0.1)
BASOS PCT: 1 %
EOS PCT: 0 %
Eosinophils Absolute: 0 10*3/uL (ref 0.0–0.5)
HEMATOCRIT: 28.9 % — AB (ref 34.8–46.6)
Hemoglobin: 9.5 g/dL — ABNORMAL LOW (ref 11.6–15.9)
Lymphocytes Relative: 10 %
Lymphs Abs: 1.4 10*3/uL (ref 0.9–3.3)
MCH: 29.8 pg (ref 25.1–34.0)
MCHC: 32.9 g/dL (ref 31.5–36.0)
MCV: 90.6 fL (ref 79.5–101.0)
MONO ABS: 1.3 10*3/uL — AB (ref 0.1–0.9)
MONOS PCT: 9 %
Neutro Abs: 11.9 10*3/uL — ABNORMAL HIGH (ref 1.5–6.5)
Neutrophils Relative %: 80 %
Platelet Count: 196 10*3/uL (ref 145–400)
RBC: 3.19 MIL/uL — ABNORMAL LOW (ref 3.70–5.45)
RDW: 15.7 % — AB (ref 11.2–14.5)
WBC Count: 14.7 10*3/uL — ABNORMAL HIGH (ref 3.9–10.3)
nRBC: 1 /100 WBC — ABNORMAL HIGH

## 2017-07-08 LAB — CMP (CANCER CENTER ONLY)
ALBUMIN: 3.6 g/dL (ref 3.5–5.0)
ALK PHOS: 120 U/L (ref 40–150)
ALT: 20 U/L (ref 0–55)
ANION GAP: 8 (ref 3–11)
AST: 19 U/L (ref 5–34)
BILIRUBIN TOTAL: 0.3 mg/dL (ref 0.2–1.2)
BUN: 10 mg/dL (ref 7–26)
CALCIUM: 9.7 mg/dL (ref 8.4–10.4)
CO2: 24 mmol/L (ref 22–29)
Chloride: 108 mmol/L (ref 98–109)
Creatinine: 0.71 mg/dL (ref 0.60–1.10)
GFR, Est AFR Am: >60 mL/min (ref >60)
GFR, Estimated: >60 mL/min (ref >60)
GLUCOSE: 107 mg/dL (ref 70–140)
POTASSIUM: 4 mmol/L (ref 3.5–5.1)
SODIUM: 140 mmol/L (ref 136–145)
TOTAL PROTEIN: 7.1 g/dL (ref 6.4–8.3)

## 2017-07-08 MED ORDER — DIPHENHYDRAMINE HCL 50 MG/ML IJ SOLN
50.0000 mg | Freq: Once | INTRAMUSCULAR | Status: AC
  Administered 2017-07-08: 50 mg via INTRAVENOUS

## 2017-07-08 MED ORDER — SODIUM CHLORIDE 0.9 % IV SOLN
80.0000 mg/m2 | Freq: Once | INTRAVENOUS | Status: AC
  Administered 2017-07-08: 162 mg via INTRAVENOUS
  Filled 2017-07-08: qty 27

## 2017-07-08 MED ORDER — FAMOTIDINE IN NACL 20-0.9 MG/50ML-% IV SOLN
INTRAVENOUS | Status: AC
  Filled 2017-07-08: qty 50

## 2017-07-08 MED ORDER — SODIUM CHLORIDE 0.9 % IV SOLN: 10.0000 mg | Freq: Once | INTRAVENOUS | Status: DC

## 2017-07-08 MED ORDER — FAMOTIDINE IN NACL 20-0.9 MG/50ML-% IV SOLN
20.0000 mg | Freq: Once | INTRAVENOUS | Status: AC
  Administered 2017-07-08: 20 mg via INTRAVENOUS

## 2017-07-08 MED ORDER — DEXAMETHASONE SODIUM PHOSPHATE 10 MG/ML IJ SOLN
INTRAMUSCULAR | Status: AC
  Filled 2017-07-08: qty 1

## 2017-07-08 MED ORDER — SODIUM CHLORIDE 0.9% FLUSH
10.0000 mL | INTRAVENOUS | Status: DC | PRN
  Administered 2017-07-08: 10 mL
  Filled 2017-07-08: qty 10

## 2017-07-08 MED ORDER — ANTICOAGULANT SODIUM CITRATE 4% (200MG/5ML) IV SOLN
5.0000 mL | Freq: Once | Status: AC
  Administered 2017-07-08: 5 mL via INTRAVENOUS
  Filled 2017-07-08: qty 5

## 2017-07-08 MED ORDER — DIPHENHYDRAMINE HCL 50 MG/ML IJ SOLN
INTRAMUSCULAR | Status: AC
  Filled 2017-07-08: qty 1

## 2017-07-08 MED ORDER — SODIUM CHLORIDE 0.9 % IV SOLN
Freq: Once | INTRAVENOUS | Status: AC
  Administered 2017-07-08: 11:00:00 via INTRAVENOUS

## 2017-07-08 MED ORDER — DEXAMETHASONE SODIUM PHOSPHATE 10 MG/ML IJ SOLN
10.0000 mg | Freq: Once | INTRAMUSCULAR | Status: AC
  Administered 2017-07-08: 10 mg via INTRAVENOUS

## 2017-07-08 NOTE — Patient Instructions (Signed)
[facility] Discharge Instructions for Patients Receiving Chemotherapy  Today you received the following chemotherapy agents Taxol  To help prevent nausea and vomiting after your treatment, we encourage you to take your nausea medication as directed.  If you develop nausea and vomiting that is not controlled by your nausea medication, call the clinic.   BELOW ARE SYMPTOMS THAT SHOULD BE REPORTED IMMEDIATELY:  *FEVER GREATER THAN 100.5 F  *CHILLS WITH OR WITHOUT FEVER  NAUSEA AND VOMITING THAT IS NOT CONTROLLED WITH YOUR NAUSEA MEDICATION  *UNUSUAL SHORTNESS OF BREATH  *UNUSUAL BRUISING OR BLEEDING  TENDERNESS IN MOUTH AND THROAT WITH OR WITHOUT PRESENCE OF ULCERS  *URINARY PROBLEMS  *BOWEL PROBLEMS  UNUSUAL RASH Items with * indicate a potential emergency and should be followed up as soon as possible.  Feel free to call the clinic should you have any questions or concerns. The clinic phone number is [phone].  Please show the [facility] at check-in to the Emergency Department and triage nurse.   Paclitaxel injection (Taxol) What is this medicine? PACLITAXEL (PAK li TAX el) is a chemotherapy drug. It targets fast dividing cells, like cancer cells, and causes these cells to die. This medicine is used to treat ovarian cancer, breast cancer, and other cancers. This medicine may be used for other purposes; ask your health care provider or pharmacist if you have questions. COMMON BRAND NAME(S): Onxol, Taxol What should I tell my health care provider before I take this medicine? They need to know if you have any of these conditions: -blood disorders -irregular heartbeat -infection (especially a virus infection such as chickenpox, cold sores, or herpes) -liver disease -previous or ongoing radiation therapy -an unusual or allergic reaction to paclitaxel, alcohol, polyoxyethylated castor oil, other chemotherapy agents, other medicines, foods, dyes, or  preservatives -pregnant or trying to get pregnant -breast-feeding How should I use this medicine? This drug is given as an infusion into a vein. It is administered in a hospital or clinic by a specially trained health care professional. Talk to your pediatrician regarding the use of this medicine in children. Special care may be needed. Overdosage: If you think you have taken too much of this medicine contact a poison control center or emergency room at once. NOTE: This medicine is only for you. Do not share this medicine with others. What if I miss a dose? It is important not to miss your dose. Call your doctor or health care professional if you are unable to keep an appointment. What may interact with this medicine? Do not take this medicine with any of the following medications: -disulfiram -metronidazole This medicine may also interact with the following medications: -cyclosporine -diazepam -ketoconazole -medicines to increase blood counts like filgrastim, pegfilgrastim, sargramostim -other chemotherapy drugs like cisplatin, doxorubicin, epirubicin, etoposide, teniposide, vincristine -quinidine -testosterone -vaccines -verapamil Talk to your doctor or health care professional before taking any of these medicines: -acetaminophen -aspirin -ibuprofen -ketoprofen -naproxen This list may not describe all possible interactions. Give your health care provider a list of all the medicines, herbs, non-prescription drugs, or dietary supplements you use. Also tell them if you smoke, drink alcohol, or use illegal drugs. Some items may interact with your medicine. What should I watch for while using this medicine? Your condition will be monitored carefully while you are receiving this medicine. You will need important blood work done while you are taking this medicine. This medicine can cause serious allergic reactions. To reduce your risk you will need to  take other medicine(s) before  treatment with this medicine. If you experience allergic reactions like skin rash, itching or hives, swelling of the face, lips, or tongue, tell your doctor or health care professional right away. In some cases, you may be given additional medicines to help with side effects. Follow all directions for their use. This drug may make you feel generally unwell. This is not uncommon, as chemotherapy can affect healthy cells as well as cancer cells. Report any side effects. Continue your course of treatment even though you feel ill unless your doctor tells you to stop. Call your doctor or health care professional for advice if you get a fever, chills or sore throat, or other symptoms of a cold or flu. Do not treat yourself. This drug decreases your body's ability to fight infections. Try to avoid being around people who are sick. This medicine may increase your risk to bruise or bleed. Call your doctor or health care professional if you notice any unusual bleeding. Be careful brushing and flossing your teeth or using a toothpick because you may get an infection or bleed more easily. If you have any dental work done, tell your dentist you are receiving this medicine. Avoid taking products that contain aspirin, acetaminophen, ibuprofen, naproxen, or ketoprofen unless instructed by your doctor. These medicines may hide a fever. Do not become pregnant while taking this medicine. Women should inform their doctor if they wish to become pregnant or think they might be pregnant. There is a potential for serious side effects to an unborn child. Talk to your health care professional or pharmacist for more information. Do not breast-feed an infant while taking this medicine. Men are advised not to father a child while receiving this medicine. This product may contain alcohol. Ask your pharmacist or healthcare provider if this medicine contains alcohol. Be sure to tell all healthcare providers you are taking this medicine.  Certain medicines, like metronidazole and disulfiram, can cause an unpleasant reaction when taken with alcohol. The reaction includes flushing, headache, nausea, vomiting, sweating, and increased thirst. The reaction can last from 30 minutes to several hours. What side effects may I notice from receiving this medicine? Side effects that you should report to your doctor or health care professional as soon as possible: -allergic reactions like skin rash, itching or hives, swelling of the face, lips, or tongue -low blood counts - This drug may decrease the number of white blood cells, red blood cells and platelets. You may be at increased risk for infections and bleeding. -signs of infection - fever or chills, cough, sore throat, pain or difficulty passing urine -signs of decreased platelets or bleeding - bruising, pinpoint red spots on the skin, black, tarry stools, nosebleeds -signs of decreased red blood cells - unusually weak or tired, fainting spells, lightheadedness -breathing problems -chest pain -high or low blood pressure -mouth sores -nausea and vomiting -pain, swelling, redness or irritation at the injection site -pain, tingling, numbness in the hands or feet -slow or irregular heartbeat -swelling of the ankle, feet, hands Side effects that usually do not require medical attention (report to your doctor or health care professional if they continue or are bothersome): -bone pain -complete hair loss including hair on your head, underarms, pubic hair, eyebrows, and eyelashes -changes in the color of fingernails -diarrhea -loosening of the fingernails -loss of appetite -muscle or joint pain -red flush to skin -sweating This list may not describe all possible side effects. Call your doctor for medical advice about   side effects. You may report side effects to FDA at 1-800-FDA-1088. Where should I keep my medicine? This drug is given in a hospital or clinic and will not be stored at  home. NOTE: This sheet is a summary. It may not cover all possible information. If you have questions about this medicine, talk to your doctor, pharmacist, or health care provider.  2018 [employer] (2015-02-05 19:58:00)   

## 2017-07-09 ENCOUNTER — Telehealth: Payer: Self-pay | Admitting: *Deleted

## 2017-07-09 ENCOUNTER — Other Ambulatory Visit: Payer: Self-pay | Admitting: Hematology

## 2017-07-09 DIAGNOSIS — C50812 Malignant neoplasm of overlapping sites of left female breast: Secondary | ICD-10-CM

## 2017-07-09 LAB — CANCER ANTIGEN 27.29: CA 27.29: 57.6 U/mL — ABNORMAL HIGH (ref 0.0–38.6)

## 2017-07-09 NOTE — Telephone Encounter (Signed)
Called pt & she states she has no problems, took nausea med last eve & none since.  Denies any issues.  Informed to call back with any questions or concerned.

## 2017-07-09 NOTE — Telephone Encounter (Signed)
-----   Message from Thomasene Lot, RN sent at 07/08/2017  1:48 PM EDT ----- Regarding: Dr Burr Medico 1st tx f/u call 1st tx f/u call

## 2017-07-15 ENCOUNTER — Inpatient Hospital Stay: Payer: Medicaid Other

## 2017-07-15 ENCOUNTER — Telehealth: Payer: Self-pay | Admitting: Nurse Practitioner

## 2017-07-15 ENCOUNTER — Encounter: Payer: Self-pay | Admitting: Nurse Practitioner

## 2017-07-15 ENCOUNTER — Encounter: Payer: Self-pay | Admitting: *Deleted

## 2017-07-15 ENCOUNTER — Inpatient Hospital Stay (HOSPITAL_BASED_OUTPATIENT_CLINIC_OR_DEPARTMENT_OTHER): Payer: Medicaid Other | Admitting: Nurse Practitioner

## 2017-07-15 VITALS — BP 114/67 | HR 87 | Temp 98.2°F | Ht 64.0 in | Wt 201.0 lb

## 2017-07-15 VITALS — Resp 20

## 2017-07-15 DIAGNOSIS — R52 Pain, unspecified: Secondary | ICD-10-CM

## 2017-07-15 DIAGNOSIS — C773 Secondary and unspecified malignant neoplasm of axilla and upper limb lymph nodes: Secondary | ICD-10-CM | POA: Diagnosis not present

## 2017-07-15 DIAGNOSIS — C50812 Malignant neoplasm of overlapping sites of left female breast: Secondary | ICD-10-CM

## 2017-07-15 DIAGNOSIS — Z5111 Encounter for antineoplastic chemotherapy: Secondary | ICD-10-CM | POA: Diagnosis not present

## 2017-07-15 DIAGNOSIS — N951 Menopausal and female climacteric states: Secondary | ICD-10-CM

## 2017-07-15 DIAGNOSIS — T451X5A Adverse effect of antineoplastic and immunosuppressive drugs, initial encounter: Secondary | ICD-10-CM

## 2017-07-15 DIAGNOSIS — F419 Anxiety disorder, unspecified: Secondary | ICD-10-CM | POA: Diagnosis not present

## 2017-07-15 DIAGNOSIS — N939 Abnormal uterine and vaginal bleeding, unspecified: Secondary | ICD-10-CM

## 2017-07-15 DIAGNOSIS — G620 Drug-induced polyneuropathy: Secondary | ICD-10-CM | POA: Diagnosis not present

## 2017-07-15 LAB — CMP (CANCER CENTER ONLY)
ALT: 48 U/L (ref 0–55)
ANION GAP: 7 (ref 3–11)
AST: 35 U/L — ABNORMAL HIGH (ref 5–34)
Albumin: 3.6 g/dL (ref 3.5–5.0)
Alkaline Phosphatase: 92 U/L (ref 40–150)
BUN: 10 mg/dL (ref 7–26)
CALCIUM: 9.4 mg/dL (ref 8.4–10.4)
CO2: 26 mmol/L (ref 22–29)
Chloride: 107 mmol/L (ref 98–109)
Creatinine: 0.72 mg/dL (ref 0.60–1.10)
Glucose, Bld: 119 mg/dL (ref 70–140)
Potassium: 3.8 mmol/L (ref 3.5–5.1)
SODIUM: 140 mmol/L (ref 136–145)
Total Bilirubin: 0.4 mg/dL (ref 0.2–1.2)
Total Protein: 6.9 g/dL (ref 6.4–8.3)

## 2017-07-15 LAB — CBC WITH DIFFERENTIAL (CANCER CENTER ONLY)
BASOS ABS: 0.1 10*3/uL (ref 0.0–0.1)
BASOS PCT: 2 %
Eosinophils Absolute: 0 10*3/uL (ref 0.0–0.5)
Eosinophils Relative: 0 %
HCT: 26.1 % — ABNORMAL LOW (ref 34.8–46.6)
HEMOGLOBIN: 9.1 g/dL — AB (ref 11.6–15.9)
Lymphocytes Relative: 18 %
Lymphs Abs: 0.9 10*3/uL (ref 0.9–3.3)
MCH: 30.7 pg (ref 25.1–34.0)
MCHC: 35 g/dL (ref 31.5–36.0)
MCV: 87.8 fL (ref 79.5–101.0)
MONOS PCT: 12 %
Monocytes Absolute: 0.6 10*3/uL (ref 0.1–0.9)
NEUTROS ABS: 3.3 10*3/uL (ref 1.5–6.5)
NEUTROS PCT: 68 %
Platelet Count: 372 10*3/uL (ref 145–400)
RBC: 2.97 MIL/uL — AB (ref 3.70–5.45)
RDW: 16.4 % — ABNORMAL HIGH (ref 11.2–14.5)
WBC: 4.9 10*3/uL (ref 3.9–10.3)

## 2017-07-15 MED ORDER — PACLITAXEL PROTEIN-BOUND CHEMO INJECTION 100 MG
80.0000 mg/m2 | Freq: Once | INTRAVENOUS | Status: AC
  Administered 2017-07-15: 150 mg via INTRAVENOUS
  Filled 2017-07-15: qty 30

## 2017-07-15 MED ORDER — DEXAMETHASONE SODIUM PHOSPHATE 10 MG/ML IJ SOLN
INTRAMUSCULAR | Status: AC
  Filled 2017-07-15: qty 1

## 2017-07-15 MED ORDER — PALONOSETRON HCL INJECTION 0.25 MG/5ML
INTRAVENOUS | Status: AC
  Filled 2017-07-15: qty 5

## 2017-07-15 MED ORDER — SODIUM CHLORIDE 0.9 % IV SOLN
300.0000 mg | Freq: Once | INTRAVENOUS | Status: AC
  Administered 2017-07-15: 300 mg via INTRAVENOUS
  Filled 2017-07-15: qty 30

## 2017-07-15 MED ORDER — DEXAMETHASONE SODIUM PHOSPHATE 10 MG/ML IJ SOLN
10.0000 mg | Freq: Once | INTRAMUSCULAR | Status: AC
  Administered 2017-07-15: 10 mg via INTRAVENOUS

## 2017-07-15 MED ORDER — FAMOTIDINE IN NACL 20-0.9 MG/50ML-% IV SOLN
INTRAVENOUS | Status: AC
  Filled 2017-07-15: qty 50

## 2017-07-15 MED ORDER — SODIUM CHLORIDE 0.9% FLUSH
10.0000 mL | INTRAVENOUS | Status: DC | PRN
  Administered 2017-07-15: 10 mL
  Filled 2017-07-15: qty 10

## 2017-07-15 MED ORDER — DIPHENHYDRAMINE HCL 50 MG/ML IJ SOLN
INTRAMUSCULAR | Status: AC
  Filled 2017-07-15: qty 1

## 2017-07-15 MED ORDER — SODIUM CHLORIDE 0.9 % IV SOLN: 10.0000 mg | Freq: Once | INTRAVENOUS | Status: DC

## 2017-07-15 MED ORDER — DIPHENHYDRAMINE HCL 50 MG/ML IJ SOLN
50.0000 mg | Freq: Once | INTRAMUSCULAR | Status: AC
  Administered 2017-07-15: 50 mg via INTRAVENOUS

## 2017-07-15 MED ORDER — FAMOTIDINE IN NACL 20-0.9 MG/50ML-% IV SOLN
20.0000 mg | Freq: Once | INTRAVENOUS | Status: AC
  Administered 2017-07-15: 20 mg via INTRAVENOUS

## 2017-07-15 MED ORDER — TRAMADOL HCL 50 MG PO TABS: 50.0000 mg | ORAL_TABLET | Freq: Four times a day (QID) | ORAL | 0 refills | Status: DC | PRN

## 2017-07-15 MED ORDER — PALONOSETRON HCL INJECTION 0.25 MG/5ML
0.2500 mg | Freq: Once | INTRAVENOUS | Status: AC
  Administered 2017-07-15: 0.25 mg via INTRAVENOUS

## 2017-07-15 MED ORDER — SODIUM CHLORIDE 0.9 % IV SOLN
Freq: Once | INTRAVENOUS | Status: AC
  Administered 2017-07-15: 11:00:00 via INTRAVENOUS

## 2017-07-15 MED ORDER — ANTICOAGULANT SODIUM CITRATE 4% (200MG/5ML) IV SOLN
5.0000 mL | Freq: Once | Status: AC
Start: 2017-07-15 — End: 2017-07-15
  Administered 2017-07-15: 5 mL via INTRAVENOUS
  Filled 2017-07-15: qty 5

## 2017-07-15 MED FILL — traMADol HCL 50 MG TABS: 50 | 5 days supply | Qty: 20 | Fill #0

## 2017-07-15 NOTE — Patient Instructions (Signed)

## 2017-07-15 NOTE — Progress Notes (Addendum)
Carrie Miller  [phone] [phone]  Clinic Follow up Note   Patient Care Team: Patient, No Pcp Per as PCP - General ([name]) [name], MD as Consulting Physician (General Surgery) [name], MD as Consulting Physician (Hematology) [name], NP as Nurse Practitioner (Nurse Practitioner) 07/15/2017  SUMMARY OF ONCOLOGIC HISTORY:   Cancer of overlapping sites of left breast ([place])   04/15/2017 Mammogram    IMPRESSION: 1. Highly suspicious irregular mass within the left breast at the 4 o'clock axis, 8 cm from the nipple, measuring 5.2 cm, corresponding to the area of clinical concern. Ultrasound-guided biopsy is recommended. 2. Additional suspicious irregular mass within the left breast at the 2 o'clock axis, 8 cm from the nipple, measuring 1.6 cm, corresponding to the mammographic finding. Ultrasound-guided biopsy is recommended. 3. Additional suspicious mass within the left breast at the 2 o'clock axis, 10 cm from the nipple, axillary tail region, measuring 3 cm, suspected lymph node completely replaced by tumor. Ultrasound-guided biopsy is recommended. 4. No evidence of malignancy within the right breast.       04/15/2017 Breast US    Left breast: Targeted ultrasound is performed, showing an irregular mass in the left breast at the 4 o'clock axis, 8 cm from the nipple, measuring 5.2 x 4.3 x 4.3 cm, with internal vascularity, corresponding to the mammographic finding and palpable lump.  There is an additional irregular hypoechoic mass in the left breast at the 2 o'clock axis, 8 cm from the nipple, measuring 1.6 x 1.3 x 1.3 cm, with internal vascularity, corresponding to the additional mass seen within the outer left breast on mammogram, suspected satellite mass.  Lastly, there is an oval circumscribed hypoechoic mass in the left breast at the 2 o'clock axis, 10 cm from the nipple, axillary tail region, with  heterogeneous echotexture, measuring 3 x 2.3 x 2.8 cm, suspected lymph node completely replaced by tumor.  Remainder of the left axilla was evaluated with ultrasound showing no additional enlarged or morphologically abnormal lymph nodes.  IMPRESSION: 1. Highly suspicious irregular mass within the left breast at the 4 o'clock axis, 8 cm from the nipple, measuring 5.2 cm, corresponding to the area of clinical concern. Ultrasound-guided biopsy is recommended. 2. Additional suspicious irregular mass within the left breast at the 2 o'clock axis, 8 cm from the nipple, measuring 1.6 cm, corresponding to the mammographic finding. Ultrasound-guided biopsy is recommended. 3. Additional suspicious mass within the left breast at the 2 o'clock axis, 10 cm from the nipple, axillary tail region, measuring 3 cm, suspected lymph node completely replaced by tumor. Ultrasound-guided biopsy is recommended. 4. No evidence of malignancy within the right breast.       04/19/2017 Initial Biopsy    Diagnosis 1. Breast, left, needle core biopsy, 4:00 o'clock, ribbon clip - INVASIVE DUCTAL CARCINOMA. - SEE COMMENT. 2. Breast, left, needle core biopsy, 2:00 o'clock, coil clip - INVASIVE DUCTAL CARCINOMA. - SEE COMMENT. 3. Lymph node, needle/core biopsy, left axilla, spiral hydromark - DUCTAL CARCINOMA. - SEE COMMENT.  2. PROGNOSTIC INDICATORS Results: IMMUNOHISTOCHEMICAL AND MORPHOMETRIC ANALYSIS PERFORMED MANUALLY Estrogen Receptor: 0%, NEGATIVE Progesterone Receptor: 0%, NEGATIVE Proliferation Marker Ki67: 80%  2. FLUORESCENCE IN-SITU HYBRIDIZATION Results: HER2 - NEGATIVE RATIO OF HER2/CEP17 SIGNALS 1.53 AVERAGE HER2 COPY NUMBER PER CELL 2.75  Microscopic Comment 1. The carcinoma in the three specimens is morphologically similar and is grade III. Lymph nodal tissue is not definitively identified in specimen #3. A breast prognostic profile will be performed  on part 2 and the  results reported separately.      04/28/2017 Initial Diagnosis    Cancer of overlapping sites of left breast ([place])      04/30/2017 Breast MRI    IMPRESSION: 1. Biopsy-proven invasive carcinoma within the lower outer quadrant of the LEFT breast, 4 o'clock axis, at posterior depth, measuring 4.8 cm, with associated biopsy clip artifact. 2. Biopsy-proven invasive carcinoma within the upper-outer quadrant of the LEFT breast, 2 o'clock axis, at posterior depth, measuring 2.5 cm, with associated biopsy clip artifact. However, contiguous non mass enhancement along the posterior margin of this 2 o'clock mass and extending 3 cm anteriorly from the mass increases the overall measurement to 5.5 cm greatest dimension (AP). 3. Diffuse edema throughout the LEFT breast, with particularly prominent component of edema extending posteriorly to abut the anterior surface of the pectoralis muscle, and diffuse skin thickening throughout the left breast. This almost certainly indicates INFLAMMATORY BREAST CANCER. 4. Biopsy-proven metastatic lymph node within the LEFT axilla measures 4.1 cm. Two additional borderline prominent lymph nodes within the left axilla. No enlarged lymph nodes within the right axilla or internal mammary chain regions. 5. No evidence of malignancy within the RIGHT breast.  RECOMMENDATION: Per current treatment plan for patient's known left breast cancer (2 biopsy-proven sites) and metastatic lymph node in the left axilla.  BI-RADS CATEGORY  6: Known biopsy-proven malignancy.       04/30/2017 Echocardiogram    Study Conclusions  - Left ventricle: The cavity size was normal. Wall thickness was   normal. Systolic function was normal. The estimated ejection   fraction was in the range of 60% to 65%. Wall motion was normal;   there were no regional wall motion abnormalities. Left   ventricular diastolic function parameters were normal. GLS:   -20.4%, RV S vel 12.1cm/s.       05/05/2017 Imaging    MRI BRAIN IMPRESSION: No intracranial or calvarial metastatic disease. Normal MRI of the brain.      05/07/2017 Imaging    CT CAP IMPRESSION: Two left breast masses and a grossly abnormal 3.9 cm lower left axillary lymph node, likely correspond to multifocal left breast cancer metastatic to the left axilla. Skin thickening of the left breast, raising concern for an inflammatory breast cancer.  1 cm hypoattenuated lesion in the posterior dome of the liver with internal attenuation slightly higher than water. This may represent a complicated liver cyst, hemangioma or a focus of metastatic disease.  No other findings to suggest distal metastatic disease.       05/07/2017 Imaging    BONE SCAN IMPRESSION: 1. No evidence of osseous metastases. 2. Abnormal uptake in the soft tissues of the left breast likely by the patient's known breast cancer.       05/11/2017 Genetic Testing    Common Cancers panel (47 genes) @ [facility] - No pathoagenic mutations detected Five Variants of Uncertain Significance were detected:  APC c.2438A>G (p.Asn813Ser)  BARD1 c.782T>C (p.Leu261Pro) MSH3 c.1655C>T (p.Thr552Ile)  MSH6 c.2108T>C (p.Met703Thr)  NTHL1 c.470G>C (p.Arg157Pro)   Genes Analyzed: 47 genes on [facility]'s Common Cancers panel (APC, ATM, AXIN2, BARD1, BMPR1A, BRCA1, BRCA2, BRIP1, CDH1, CDK4, CDKN2A, CHEK2, CTNNA1, DICER1, EPCAM, GREM1, HOXB13, KIT, MEN1, MLH1, MSH2, MSH3, MSH6, MUTYH, NBN, NF1, NTHL1, PALB2, PDGFRA, PMS2, POLD1, POLE, PTEN, RAD50, RAD51C, RAD51D, SDHA, SDHB, SDHC, SDHD, SMAD4, SMARCA4, STK11, TP53, TSC1, TSC2, VHL).       05/12/2017 -  Chemotherapy    1. neoadjuvant AC q2 weeks x4  cycles then weekly carbo/taxol for 12 weeks beginning 05/12/17.  2. Monthly zoladex injections starting 04/30/17 for the duration of chemotherapy.      CURRENT THERAPY:  1. Neoadjuvant AC q2 weeks x4 cycles then weekly beginning 05/12/17 followed by carbo/taxol for 12  weeks starting 07/08/17; Taxol changed to abraxane with cycle 2 due to severe body aches and cramps, added Carboplatin with cycle 2 on 07/15/17 2. Monthly zoladex injections starting 04/30/17 for the duration of chemotherapy.   INTERVAL HISTORY: Carrie Miller returns for follow up as scheduled prior to cycle 2 taxol. S/p cycle 1 on 07/08/17.  On day 2 she developed severe 9/10 constant generalized body aches to her back, shoulders, hands, and feet.  Also developed new pain in left upper breast and occasional LUQ pain. Pain does not improve with ibuprofen.  Last Neulasta on 06/24/17 but she continued to take Claritin to improve her pain but did not help. She has difficulty sleeping due to pain.  Hot flashes are improved, now just once daily. Mild intermittent neuropathy is stable, no functional difficulties. Otherwise she feels well, mild fatigue but able to complete activities normally. Good appetite, no nausea, vomiting, constipation, or diarrhea. Denies bleeding.  REVIEW OF SYSTEMS:   Constitutional: Denies fevers, chills or abnormal weight loss (+) mild fatigue  Eyes: Denies blurriness of vision Ears, nose, mouth, throat, and face: Denies mucositis or sore throat Respiratory: Denies cough, dyspnea or wheezes Cardiovascular: Denies palpitation, chest discomfort or lower extremity swelling Gastrointestinal:  Denies nausea, vomiting, constipation, diarrhea, heartburn or change in bowel habits (+) mild intermittent LUQ pain Skin: Denies abnormal skin rashes (+) mild itching, feet  Lymphatics: Denies new lymphadenopathy or easy bruising Neurological:Denies new weaknesses (+) stable neuropathy (+) hot flashes, improved now once daily  Behavioral/Psych: Mood is stable, no new changes  MSK: (+) severe constant generalized body aches to shoulders, back, abdomen, hands, and feet  Breast: (+) left upper middle breast pain  All other systems were reviewed with the patient and are negative.  MEDICAL HISTORY:   Past Medical History:  Diagnosis Date  . Breast cancer ([place])   . Family history of cancer   . Genetic testing 04/29/2017   Common Cancers panel (47 genes) @ [facility] - No pathoagenic mutations detected    SURGICAL HISTORY: Past Surgical History:  Procedure Laterality Date  . IR FLUORO GUIDE PORT INSERTION RIGHT  05/03/2017  . IR US GUIDE VASC ACCESS RIGHT  05/03/2017    I have reviewed the social history and family history with the patient and they are unchanged from previous note.  ALLERGIES:  is allergic to heparin.  MEDICATIONS:  Current Outpatient Medications  Medication Sig Dispense Refill  . ALPRAZolam (XANAX) 0.25 MG tablet Take 1 tablet (0.25 mg total) by mouth at bedtime as needed for anxiety. 30 tablet 0  . ibuprofen (ADVIL,MOTRIN) 200 MG tablet Take 2 tablets (400 mg total) by mouth every 6 (six) hours as needed. 30 tablet 0  . lidocaine-prilocaine (EMLA) cream Apply to affected area once 30 g 3  . LORazepam (ATIVAN) 0.5 MG tablet Take 1 tablet (0.5 mg total) by mouth every 8 (eight) hours as needed (nausea, vomiting). 20 tablet 0  . ondansetron (ZOFRAN) 8 MG tablet Take 1 tablet (8 mg total) by mouth 2 (two) times daily as needed. Start on the third day after chemotherapy. 30 tablet 1  . prochlorperazine (COMPAZINE) 10 MG tablet Take 1 tablet (10 mg total) by mouth every 6 (six) hours as needed (  Nausea or vomiting). 30 tablet 1  . traMADol (ULTRAM) 50 MG tablet Take 1 tablet (50 mg total) by mouth every 6 (six) hours as needed. 20 tablet 0   No current facility-administered medications for this visit.    Facility-Administered Medications Ordered in Other Visits  Medication Dose Route Frequency Provider Last Rate Last Dose  . sodium chloride flush (NS) 0.9 % injection 10 mL  10 mL Intracatheter PRN [name], MD   10 mL at 07/15/17 1432    PHYSICAL EXAMINATION: ECOG PERFORMANCE STATUS: 1 - Symptomatic but completely ambulatory  Vitals:   07/15/17 0917  BP: 114/67   Pulse: 87  Temp: 98.2 F (36.8 C)  SpO2: (!) 20%   Filed Weights   07/15/17 0917  Weight: 201 lb (91.2 kg)    GENERAL:alert, no distress and comfortable SKIN: skin color, texture, turgor are normal, no rashes or significant lesions EYES: normal, Conjunctiva are pink and non-injected, sclera clear OROPHARYNX:no exudate, no erythema and lips, buccal mucosa, and tongue normal  LYMPH:  no palpable cervical, supraclavicular, or axillary lymphadenopathy  LUNGS: clear to auscultation and percussion with normal breathing effort HEART: regular rate & rhythm and no murmurs and no lower extremity edema ABDOMEN:abdomen soft, non-tender and normal bowel sounds. No palpable hepatosplenomegaly or masses Musculoskeletal:no cyanosis of digits and no clubbing  NEURO: alert & oriented x 3 with fluent speech, no focal motor/sensory deficits BREASTS: No palpable mass in right breast or axilla that I could appreciate. (+) masses at 2:00 and 4:00 in left breast are not palpable on today's exam. (+) new soft tissue fullness at 12:00 in left breast between the breast and chest wall, mildly tender on palpation. No breast erythema or edema; No left axillary adenopathy PAC without erythema   LABORATORY DATA:  I have reviewed the data as listed CBC Latest Ref Rng & Units 07/15/2017 07/08/2017 06/24/2017  WBC 3.9 - 10.3 K/uL 4.9 14.7(H) 20.4(H)  Hemoglobin 12.0 - 15.0 g/dL - - -  Hematocrit 34.8 - 46.6 % 26.1(L) 28.9(L) 31.0(L)  Platelets 145 - 400 K/uL 372 196 210     CMP Latest Ref Rng & Units 07/15/2017 07/08/2017 06/24/2017  Glucose 70 - 140 mg/dL 119 107 96  BUN 7 - 26 mg/dL 10 10 11   Creatinine 0.60 - 1.10 mg/dL 0.72 0.71 0.69  Sodium 136 - 145 mmol/L 140 140 140  Potassium 3.5 - 5.1 mmol/L 3.8 4.0 4.1  Chloride 98 - 109 mmol/L 107 108 108  CO2 22 - 29 mmol/L 26 24 25   Calcium 8.4 - 10.4 mg/dL 9.4 9.7 9.4  Total Protein 6.4 - 8.3 g/dL 6.9 7.1 7.1  Total Bilirubin 0.2 - 1.2 mg/dL 0.4 0.3 0.3  Alkaline  Phos 40 - 150 U/L 92 120 124  AST 5 - 34 U/L 35(H) 19 17  ALT 0 - 55 U/L 48 20 21   PATHOLOGY  Diagnosis 05/10/17 1. Cervix, biopsy, 11 o'clock - ATYPICAL SQUAMOUS METAPLASIA ASSOCIATED WITH INFLAMMATION. - SEE COMMENT. 2. Endocervix, curettage - DETACHED FRAGMENTS OF SQUAMOUS MUCOSA WITH SLIGHT ATYPIA. - BENIGN ENDOCERVICAL MUCOSA. - SEE COMMENT. Microscopic Comment 1. The sections show multiple fragments of mostly metaplastic squamous mucosa displaying prominent chronic and acute inflammation to a lesser extent. This is associated with epithelial atypia in the form of nuclear enlargement, and small nucleoli. The inflammatory process obscures the epithelium which is somewhat difficult to evaluate. A p16 stain was performed and is negative. Overall, definitive or diagnostic squamous intraepithelial lesion is not  appreciated in this setting, and the changes may represent a florid reactive state. Nonetheless, clinical correlation and follow up is strongly recommended. 2. The sections show multiple detached fragments of squamous mucosa, some of which display slight atypia. This is admixed with multiple fragments of benign endocervical mucosa. A p16 stain was performed and is negative. The overall changes are not specific or diagnostic of a squamous intraepithelial lesion. Clinical correlation and follow up is recommended. (BNS:ah 05/12/17)  GYNECOLOGIC CYTOLOGY REPORT Adequacy Reason 05/10/17 Satisfactory for evaluation, endocervical/transformation zone component PRESENT. Diagnosis NEGATIVE FOR INTRAEPITHELIAL LESIONS OR MALIGNANCY. BENIGN REACTIVE/REPARATIVE CHANGES.    Diagnosis 04/19/17 1. Breast, left, needle core biopsy, 4:00 o'clock, ribbon clip - INVASIVE DUCTAL CARCINOMA. - SEE COMMENT. 2. Breast, left, needle core biopsy, 2:00 o'clock, coil clip - INVASIVE DUCTAL CARCINOMA. - SEE COMMENT. 3. Lymph node, needle/core biopsy, left axilla, spiral hydromark - DUCTAL  CARCINOMA. - SEE COMMENT. Microscopic Comment 1. The carcinoma in the three specimens is morphologically similar and is grade III. Lymph nodal tissue is not definitively identified in specimen #3. A breast prognostic profile will be performed on part 2 and the results reported separately. 2. FLUORESCENCE IN-SITU HYBRIDIZATION Results: HER2 - NEGATIVE RATIO OF HER2/CEP17 SIGNALS 1.53 AVERAGE HER2 COPY NUMBER PER CELL 2.75 2. PROGNOSTIC INDICATORS Results: IMMUNOHISTOCHEMICAL AND MORPHOMETRIC ANALYSIS PERFORMED MANUALLY Estrogen Receptor: 0%, NEGATIVE Progesterone Receptor: 0%, NEGATIVE Proliferation Marker Ki67: 80% COMMENT: The negative hormone receptor study(ies) in this case has An internal positive control.    RADIOGRAPHIC STUDIES: I have personally reviewed the radiological images as listed and agreed with the findings in the report. No results found.   ASSESSMENT & PLAN: Bekah Igoe a 22 y.o. 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 She completed 4 cycles neoadjuvant AC with neulasta from 05/12/17 - 06/24/17; she tolerated well overall. S/p cycle 1 neoadjuvant weekly taxol on 07/08/17, she developed severe generalized body aches and cramps, not responding to NSAIDs. Dr. Burr Medico recommends changing to weekly Abraxane. In addition, given her aggressive triple negative T3N1 disease, will add carboplatin to her weekly regimen. We reviewed side effects such as increased fatigue, neuropathy, and hematologic toxicities; she agrees to proceed.  -She has new soft tissue fullness in the 12:00 position of left breast, the previously biopsy-proven  malignancies in left upper outer and lower outer quadrants are not palpable. Will monitor this area closely at this point. If it becomes larger will consider imaging  -CA 27.29 was elevated last week to 57.6, previously 20.9 two months ago; repeat level today is pending -Labs reviewed, AST slightly elevated to 35; she had mild transient transaminitis after cycle 1 AC; will monitor. Labs adequate for treatment. Proceed with cycle 2 abraxane/carboplatin and continue weekly. -f/u in 1 week with cycle 3  2. Generalized body aches, secondary to Taxol -she developed severe body aches after cycle 1 Taxol, impairing sleep. NSAIDs did not improve. Will prescribe tramadol today. Dr. Burr Medico recommends change to abraxane. Will monitor.   3. Headaches, dizziness -MRI brain negative for metastasis. Resolved.   4. Anxiety -Stable  5. Social issues -Medicaid was approved. Followed closely by breast navigator and SW  6. Genetics  -Negative for pathogenic mutation  7. Fertility planning -She declined oocyte cryopreservation at  [facility], on monthly zoladex for ovarian suppression  8. Irregular vaginal bleeding -04/15/18 PAP with atypical squamous cells; colposcopy with D&C 05/10/17 was abnormal but negative for malignancy; f/u with GYN  9. Hot flashes secondary to zoladex and possibly chemotherapy -developed after cycle 2 AC, occurred 3x per day; now improved to once daily  10. Peripheral neuropathy, G1, secondary to chemotherapy  -Developed after cycle 3 AC; no functional limitations or sensory deficit. Overall stable, will monitor   PLAN: -Change Taxol to abraxane due to severe body aches and add on Carboplatin with cycle 2, proceed with treatment today -f/u in 1 week with cycle 3 carbo/abraxane  -Prescription for Tramadol  All questions were answered. The patient knows to call the clinic with any problems, questions or concerns. No barriers to learning was detected. I spent 25 minutes counseling the  patient face to face. The total time spent in the appointment was 35 minutes and more than 50% was on counseling and review of test results     [name], NP 07/15/17   Addendum  I have seen the patient, examined her. I agree with the assessment and and plan and have edited the notes.   She has had severe body pain after first dose Taxol, no significant neuropathy or skin rash.  I will change her Taxol to Abraxane, to see if her body pain would improve.  Due to her high risk disease, I also recommend adding weekly carboplatin AUC 2 to her treatment, will start today.  Her exam today showed a soft tissue fullness at the 12:00 of left breast, will continue exam her in the next few weeks, if does not resolve, will get a breast ultrasound.  Discussed the above with patient and her brother in details, they agree with the plan.  [name]  07/15/2017

## 2017-07-15 NOTE — Telephone Encounter (Signed)
Opened in error

## 2017-07-15 NOTE — Patient Instructions (Signed)
[facility] Discharge Instructions for Patients Receiving Chemotherapy  Today you received the following chemotherapy agents Abraxane, Carboplatin  To help prevent nausea and vomiting after your treatment, we encourage you to take your nausea medication as prescribed.   If you develop nausea and vomiting that is not controlled by your nausea medication, call the clinic.   BELOW ARE SYMPTOMS THAT SHOULD BE REPORTED IMMEDIATELY:  *FEVER GREATER THAN 100.5 F  *CHILLS WITH OR WITHOUT FEVER  NAUSEA AND VOMITING THAT IS NOT CONTROLLED WITH YOUR NAUSEA MEDICATION  *UNUSUAL SHORTNESS OF BREATH  *UNUSUAL BRUISING OR BLEEDING  TENDERNESS IN MOUTH AND THROAT WITH OR WITHOUT PRESENCE OF ULCERS  *URINARY PROBLEMS  *BOWEL PROBLEMS  UNUSUAL RASH Items with * indicate a potential emergency and should be followed up as soon as possible.  Feel free to call the clinic should you have any questions or concerns. The clinic phone number is [phone].  Please show the [facility] at check-in to the Emergency Department and triage nurse.  Carboplatin injection What is this medicine? CARBOPLATIN (KAR boe pla tin) is a chemotherapy drug. It targets fast dividing cells, like cancer cells, and causes these cells to die. This medicine is used to treat ovarian cancer and many other cancers. This medicine may be used for other purposes; ask your health care provider or pharmacist if you have questions. COMMON BRAND NAME(S): Paraplatin What should I tell my health care provider before I take this medicine? They need to know if you have any of these conditions: -blood disorders -hearing problems -kidney disease -recent or ongoing radiation therapy -an unusual or allergic reaction to carboplatin, cisplatin, other chemotherapy, other medicines, foods, dyes, or preservatives -pregnant or trying to get pregnant -breast-feeding How should I use this medicine? This drug is usually  given as an infusion into a vein. It is administered in a hospital or clinic by a specially trained health care professional. Talk to your pediatrician regarding the use of this medicine in children. Special care may be needed. Overdosage: If you think you have taken too much of this medicine contact a poison control center or emergency room at once. NOTE: This medicine is only for you. Do not share this medicine with others. What if I miss a dose? It is important not to miss a dose. Call your doctor or health care professional if you are unable to keep an appointment. What may interact with this medicine? -medicines for seizures -medicines to increase blood counts like filgrastim, pegfilgrastim, sargramostim -some antibiotics like amikacin, gentamicin, neomycin, streptomycin, tobramycin -vaccines Talk to your doctor or health care professional before taking any of these medicines: -acetaminophen -aspirin -ibuprofen -ketoprofen -naproxen This list may not describe all possible interactions. Give your health care provider a list of all the medicines, herbs, non-prescription drugs, or dietary supplements you use. Also tell them if you smoke, drink alcohol, or use illegal drugs. Some items may interact with your medicine. What should I watch for while using this medicine? Your condition will be monitored carefully while you are receiving this medicine. You will need important blood work done while you are taking this medicine. This drug may make you feel generally unwell. This is not uncommon, as chemotherapy can affect healthy cells as well as cancer cells. Report any side effects. Continue your course of treatment even though you feel ill unless your doctor tells you to stop. In some cases, you may be given additional medicines to help with side effects. Follow  all directions for their use. Call your doctor or health care professional for advice if you get a fever, chills or sore throat, or  other symptoms of a cold or flu. Do not treat yourself. This drug decreases your body's ability to fight infections. Try to avoid being around people who are sick. This medicine may increase your risk to bruise or bleed. Call your doctor or health care professional if you notice any unusual bleeding. Be careful brushing and flossing your teeth or using a toothpick because you may get an infection or bleed more easily. If you have any dental work done, tell your dentist you are receiving this medicine. Avoid taking products that contain aspirin, acetaminophen, ibuprofen, naproxen, or ketoprofen unless instructed by your doctor. These medicines may hide a fever. Do not become pregnant while taking this medicine. Women should inform their doctor if they wish to become pregnant or think they might be pregnant. There is a potential for serious side effects to an unborn child. Talk to your health care professional or pharmacist for more information. Do not breast-feed an infant while taking this medicine. What side effects may I notice from receiving this medicine? Side effects that you should report to your doctor or health care professional as soon as possible: -allergic reactions like skin rash, itching or hives, swelling of the face, lips, or tongue -signs of infection - fever or chills, cough, sore throat, pain or difficulty passing urine -signs of decreased platelets or bleeding - bruising, pinpoint red spots on the skin, black, tarry stools, nosebleeds -signs of decreased red blood cells - unusually weak or tired, fainting spells, lightheadedness -breathing problems -changes in hearing -changes in vision -chest pain -high blood pressure -low blood counts - This drug may decrease the number of white blood cells, red blood cells and platelets. You may be at increased risk for infections and bleeding. -nausea and vomiting -pain, swelling, redness or irritation at the injection site -pain, tingling,  numbness in the hands or feet -problems with balance, talking, walking -trouble passing urine or change in the amount of urine Side effects that usually do not require medical attention (report to your doctor or health care professional if they continue or are bothersome): -hair loss -loss of appetite -metallic taste in the mouth or changes in taste This list may not describe all possible side effects. Call your doctor for medical advice about side effects. You may report side effects to FDA at 1-800-FDA-1088. Where should I keep my medicine? This drug is given in a hospital or clinic and will not be stored at home. NOTE: This sheet is a summary. It may not cover all possible information. If you have questions about this medicine, talk to your doctor, pharmacist, or health care provider.  2018 [employer] Standard (2007-07-12 14:38:05)  (Abraxane) Nanoparticle Albumin-Bound Paclitaxel injection What is this medicine? NANOPARTICLE ALBUMIN-BOUND PACLITAXEL (Na no PAHR ti kuhl al BYOO muhn-bound PAK li TAX el) is a chemotherapy drug. It targets fast dividing cells, like cancer cells, and causes these cells to die. This medicine is used to treat advanced breast cancer and advanced lung cancer. This medicine may be used for other purposes; ask your health care provider or pharmacist if you have questions. COMMON BRAND NAME(S): Abraxane What should I tell my health care provider before I take this medicine? They need to know if you have any of these conditions: -kidney disease -liver disease -low blood counts, like low platelets, red blood cells, or white blood cells -  recent or ongoing radiation therapy -an unusual or allergic reaction to paclitaxel, albumin, other chemotherapy, other medicines, foods, dyes, or preservatives -pregnant or trying to get pregnant -breast-feeding How should I use this medicine? This drug is given as an infusion into a vein. It is administered in a hospital or  clinic by a specially trained health care professional. Talk to your pediatrician regarding the use of this medicine in children. Special care may be needed. Overdosage: If you think you have taken too much of this medicine contact a poison control center or emergency room at once. NOTE: This medicine is only for you. Do not share this medicine with others. What if I miss a dose? It is important not to miss your dose. Call your doctor or health care professional if you are unable to keep an appointment. What may interact with this medicine? -cyclosporine -diazepam -ketoconazole -medicines to increase blood counts like filgrastim, pegfilgrastim, sargramostim -other chemotherapy drugs like cisplatin, doxorubicin, epirubicin, etoposide, teniposide, vincristine -quinidine -testosterone -vaccines -verapamil Talk to your doctor or health care professional before taking any of these medicines: -acetaminophen -aspirin -ibuprofen -ketoprofen -naproxen This list may not describe all possible interactions. Give your health care provider a list of all the medicines, herbs, non-prescription drugs, or dietary supplements you use. Also tell them if you smoke, drink alcohol, or use illegal drugs. Some items may interact with your medicine. What should I watch for while using this medicine? Your condition will be monitored carefully while you are receiving this medicine. You will need important blood work done while you are taking this medicine. This medicine can cause serious allergic reactions. If you experience allergic reactions like skin rash, itching or hives, swelling of the face, lips, or tongue, tell your doctor or health care professional right away. In some cases, you may be given additional medicines to help with side effects. Follow all directions for their use. This drug may make you feel generally unwell. This is not uncommon, as chemotherapy can affect healthy cells as well as cancer cells.  Report any side effects. Continue your course of treatment even though you feel ill unless your doctor tells you to stop. Call your doctor or health care professional for advice if you get a fever, chills or sore throat, or other symptoms of a cold or flu. Do not treat yourself. This drug decreases your body's ability to fight infections. Try to avoid being around people who are sick. This medicine may increase your risk to bruise or bleed. Call your doctor or health care professional if you notice any unusual bleeding. Be careful brushing and flossing your teeth or using a toothpick because you may get an infection or bleed more easily. If you have any dental work done, tell your dentist you are receiving this medicine. Avoid taking products that contain aspirin, acetaminophen, ibuprofen, naproxen, or ketoprofen unless instructed by your doctor. These medicines may hide a fever. Do not become pregnant while taking this medicine. Women should inform their doctor if they wish to become pregnant or think they might be pregnant. There is a potential for serious side effects to an unborn child. Talk to your health care professional or pharmacist for more information. Do not breast-feed an infant while taking this medicine. Men are advised not to father a child while receiving this medicine. What side effects may I notice from receiving this medicine? Side effects that you should report to your doctor or health care professional as soon as possible: -allergic reactions like  skin rash, itching or hives, swelling of the face, lips, or tongue -low blood counts - This drug may decrease the number of white blood cells, red blood cells and platelets. You may be at increased risk for infections and bleeding. -signs of infection - fever or chills, cough, sore throat, pain or difficulty passing urine -signs of decreased platelets or bleeding - bruising, pinpoint red spots on the skin, black, tarry stools,  nosebleeds -signs of decreased red blood cells - unusually weak or tired, fainting spells, lightheadedness -breathing problems -changes in vision -chest pain -high or low blood pressure -mouth sores -nausea and vomiting -pain, swelling, redness or irritation at the injection site -pain, tingling, numbness in the hands or feet -slow or irregular heartbeat -swelling of the ankle, feet, hands Side effects that usually do not require medical attention (report to your doctor or health care professional if they continue or are bothersome): -aches, pains -changes in the color of fingernails -diarrhea -hair loss -loss of appetite This list may not describe all possible side effects. Call your doctor for medical advice about side effects. You may report side effects to FDA at 1-800-FDA-1088. Where should I keep my medicine? This drug is given in a hospital or clinic and will not be stored at home. NOTE: This sheet is a summary. It may not cover all possible information. If you have questions about this medicine, talk to your doctor, pharmacist, or health care provider.  2018 [employer]/Gold Standard (2015-02-06 10:05:20)

## 2017-07-16 LAB — CANCER ANTIGEN 27.29: CAN 27.29: 48.5 U/mL — AB (ref 0.0–38.6)

## 2017-07-21 NOTE — Progress Notes (Signed)
[place]  Telephone:(336) [phone] Fax:(336) [phone]  Clinic Follow Up Note   Patient Care Team: Patient, No Pcp Per as PCP - General ([place]) [name], MD as Consulting Physician (General Surgery) [name], MD as Consulting Physician (Hematology) [name], NP as Nurse Practitioner (Nurse Practitioner)   Date of Service:  07/22/2017  CHIEF COMPLAINTS:  Follow up left breast cancer, triple negative     Cancer of overlapping sites of left breast ([place])   04/15/2017 Mammogram    IMPRESSION: 1. Highly suspicious irregular mass within the left breast at the 4 o'clock axis, 8 cm from the nipple, measuring 5.2 cm, corresponding to the area of clinical concern. Ultrasound-guided biopsy is recommended. 2. Additional suspicious irregular mass within the left breast at the 2 o'clock axis, 8 cm from the nipple, measuring 1.6 cm, corresponding to the mammographic finding. Ultrasound-guided biopsy is recommended. 3. Additional suspicious mass within the left breast at the 2 o'clock axis, 10 cm from the nipple, axillary tail region, measuring 3 cm, suspected lymph node completely replaced by tumor. Ultrasound-guided biopsy is recommended. 4. No evidence of malignancy within the right breast.       04/15/2017 Breast US    Left breast: Targeted ultrasound is performed, showing an irregular mass in the left breast at the 4 o'clock axis, 8 cm from the nipple, measuring 5.2 x 4.3 x 4.3 cm, with internal vascularity, corresponding to the mammographic finding and palpable lump.  There is an additional irregular hypoechoic mass in the left breast at the 2 o'clock axis, 8 cm from the nipple, measuring 1.6 x 1.3 x 1.3 cm, with internal vascularity, corresponding to the additional mass seen within the outer left breast on mammogram, suspected satellite mass.  Lastly, there is an oval circumscribed hypoechoic mass in the left breast at the 2 o'clock  axis, 10 cm from the nipple, axillary tail region, with heterogeneous echotexture, measuring 3 x 2.3 x 2.8 cm, suspected lymph node completely replaced by tumor.  Remainder of the left axilla was evaluated with ultrasound showing no additional enlarged or morphologically abnormal lymph nodes.  IMPRESSION: 1. Highly suspicious irregular mass within the left breast at the 4 o'clock axis, 8 cm from the nipple, measuring 5.2 cm, corresponding to the area of clinical concern. Ultrasound-guided biopsy is recommended. 2. Additional suspicious irregular mass within the left breast at the 2 o'clock axis, 8 cm from the nipple, measuring 1.6 cm, corresponding to the mammographic finding. Ultrasound-guided biopsy is recommended. 3. Additional suspicious mass within the left breast at the 2 o'clock axis, 10 cm from the nipple, axillary tail region, measuring 3 cm, suspected lymph node completely replaced by tumor. Ultrasound-guided biopsy is recommended. 4. No evidence of malignancy within the right breast.       04/19/2017 Initial Biopsy    Diagnosis 1. Breast, left, needle core biopsy, 4:00 o'clock, ribbon clip - INVASIVE DUCTAL CARCINOMA. - SEE COMMENT. 2. Breast, left, needle core biopsy, 2:00 o'clock, coil clip - INVASIVE DUCTAL CARCINOMA. - SEE COMMENT. 3. Lymph node, needle/core biopsy, left axilla, spiral hydromark - DUCTAL CARCINOMA. - SEE COMMENT.  2. PROGNOSTIC INDICATORS Results: IMMUNOHISTOCHEMICAL AND MORPHOMETRIC ANALYSIS PERFORMED MANUALLY Estrogen Receptor: 0%, NEGATIVE Progesterone Receptor: 0%, NEGATIVE Proliferation Marker Ki67: 80%  2. FLUORESCENCE IN-SITU HYBRIDIZATION Results: HER2 - NEGATIVE RATIO OF HER2/CEP17 SIGNALS 1.53 AVERAGE HER2 COPY NUMBER PER CELL 2.75  Microscopic Comment 1. The carcinoma in the three specimens is morphologically similar and is grade III. Lymph nodal tissue  is not definitively identified in specimen #3. A breast prognostic  profile will be performed on part 2 and the results reported separately.      04/28/2017 Initial Diagnosis    Cancer of overlapping sites of left breast ([place])      04/30/2017 Breast MRI    IMPRESSION: 1. Biopsy-proven invasive carcinoma within the lower outer quadrant of the LEFT breast, 4 o'clock axis, at posterior depth, measuring 4.8 cm, with associated biopsy clip artifact. 2. Biopsy-proven invasive carcinoma within the upper-outer quadrant of the LEFT breast, 2 o'clock axis, at posterior depth, measuring 2.5 cm, with associated biopsy clip artifact. However, contiguous non mass enhancement along the posterior margin of this 2 o'clock mass and extending 3 cm anteriorly from the mass increases the overall measurement to 5.5 cm greatest dimension (AP). 3. Diffuse edema throughout the LEFT breast, with particularly prominent component of edema extending posteriorly to abut the anterior surface of the pectoralis muscle, and diffuse skin thickening throughout the left breast. This almost certainly indicates INFLAMMATORY BREAST CANCER. 4. Biopsy-proven metastatic lymph node within the LEFT axilla measures 4.1 cm. Two additional borderline prominent lymph nodes within the left axilla. No enlarged lymph nodes within the right axilla or internal mammary chain regions. 5. No evidence of malignancy within the RIGHT breast.  RECOMMENDATION: Per current treatment plan for patient's known left breast cancer (2 biopsy-proven sites) and metastatic lymph node in the left axilla.  BI-RADS CATEGORY  6: Known biopsy-proven malignancy.       04/30/2017 Echocardiogram    Study Conclusions  - Left ventricle: The cavity size was normal. Wall thickness was   normal. Systolic function was normal. The estimated ejection   fraction was in the range of 60% to 65%. Wall motion was normal;   there were no regional wall motion abnormalities. Left   ventricular diastolic function parameters were  normal. GLS:   -20.4%, RV S vel 12.1cm/s.      05/05/2017 Imaging    MRI BRAIN IMPRESSION: No intracranial or calvarial metastatic disease. Normal MRI of the brain.      05/07/2017 Imaging    CT CAP IMPRESSION: Two left breast masses and a grossly abnormal 3.9 cm lower left axillary lymph node, likely correspond to multifocal left breast cancer metastatic to the left axilla. Skin thickening of the left breast, raising concern for an inflammatory breast cancer.  1 cm hypoattenuated lesion in the posterior dome of the liver with internal attenuation slightly higher than water. This may represent a complicated liver cyst, hemangioma or a focus of metastatic disease.  No other findings to suggest distal metastatic disease.       05/07/2017 Imaging    BONE SCAN IMPRESSION: 1. No evidence of osseous metastases. 2. Abnormal uptake in the soft tissues of the left breast likely by the patient's known breast cancer.       05/11/2017 Genetic Testing    Common Cancers panel (47 genes) @ [facility] - No pathoagenic mutations detected Five Variants of Uncertain Significance were detected:  APC c.2438A>G (p.Asn813Ser)  BARD1 c.782T>C (p.Leu261Pro) MSH3 c.1655C>T (p.Thr552Ile)  MSH6 c.2108T>C (p.Met703Thr)  NTHL1 c.470G>C (p.Arg157Pro)   Genes Analyzed: 47 genes on [facility]'s Common Cancers panel (APC, ATM, AXIN2, BARD1, BMPR1A, BRCA1, BRCA2, BRIP1, CDH1, CDK4, CDKN2A, CHEK2, CTNNA1, DICER1, EPCAM, GREM1, HOXB13, KIT, MEN1, MLH1, MSH2, MSH3, MSH6, MUTYH, NBN, NF1, NTHL1, PALB2, PDGFRA, PMS2, POLD1, POLE, PTEN, RAD50, RAD51C, RAD51D, SDHA, SDHB, SDHC, SDHD, SMAD4, SMARCA4, STK11, TP53, TSC1, TSC2, VHL).  05/12/2017 -  Chemotherapy    1. Neoadjuvant AC q2 weeks x4 cycles then weekly 05/12/17 - 06/24/17 followed by carbo/taxol for 12 weeks starting 07/08/17. Due to poor toleration, changed Taxol to abraxane and added Carboplatin with cycle 2.  2. Monthly zoladex injections starting 04/30/17  for the duration of chemotherapy.        HISTORY OF PRESENTING ILLNESS: 04/28/17 Carrie Miller 22 y.o. female is here because of newly diagnosed left breast cancer. She was referred by Dr. Barry Dienes. She presents with her brother and his wife; she speaks minimal [place], her family interpreted for her as she refused interpreter services. She initially palpated a left breast mass 6 weeks ago that was painful with breast swelling and felt to be enlarging. Has not had prior mammogram. Denies nipple inversion, discharge, or skin dimpling. She went to [facility] urgent care and was then referred to the breast center for imaging and diagnostics. Diagnostic mammogram showed a dominant mass in the left breast at the 4 o'clock axis, 8 cm from the nipple, measuring 5.2 x 4.3 x 4.3 cm; a satellite mass in the left breast at the 2 o'clock axis, 8 cm from the nipple, measuring 1.6 x 1.3 x 1.3 cm; and a circumscribed hypoechoic mass in the left breast at the 2 o'clock axis, 10 cm from the nipple, axillary tail measuring 3 x 2.3 x 2.8 cm, suspected lymph node completely replaced by tumor.  Ultrasound-guided biopsy of all 3 masses were positive for invasive ductal carcinoma, ER/PR negative, HER-2 negative, grade 3.    In addition she reports 1 month history of frequent daily headaches with associated dizziness and occasional left eye pain. Pain often wakes her up from sleep, has tried ibuprofen with some relief. Pain usually at the crown of her head, average 5/10 - 9/10 on pain scale. Not sensitive to light or sound. Denies fall. Reports fatigue for 1 week, denies weight loss, decreased appetite, abdominal pain. Over last 2 months she has irregular vaginal bleeding, bleeding 20 days out of the last month. Recently had abnormal PAP smear, colposcopy is pending. She feels very anxious about her diagnosis.  She has no significant past medical history. Her father has prostate cancer diagnosed at age 48. Paternal aunt had  "abdominal cancer." a paternal uncle also had prostate cancer diagnosed age 44. Negative family history of breast or GYN cancer. She lives with her family in a home with her parents, brother, and his brother's wife. She does not work or drive. She lived in [place] and moved to [place] 9 months ago. She has permanent resident card, no health insurance. Has been getting assistance with BCCCP program through [employer].  GYN HISTORY  Menarchal: 7 LMP: currently menstruating  Contraceptive: none HRT: none GP: G0    CURRENT THERAPY:  1. Neoadjuvant AC q2 weeks x4 cycles then weekly 05/12/17 - 06/24/17 followed by carbo/taxol for 12 weeks starting 07/08/17. Due to poor toleration, changed Taxol to abraxane and added Carboplatin with cycle 2.  2. Monthly zoladex injections starting 04/30/17 for the duration of chemotherapy.   INTERVAL HISTORY:  Carrie Miller is here for a follow up and cycle 3 Taxol/Carbo. She presents to the clinic today accompanied by her brother who helps translate for her.  She notes her dizziness and her headaches have returned. After change in chemo she has less pain but more fatigued. She uses colgate 3 times a day but her tongue is sensitive to it. She notes she went to  see surgeon about reconstruction. She notes she wants to keep her breast and not do surgery because she does not want to lose her breast. She notes she cries a lot and is overwhelmed because she does not know what to do. She feels she has limited people to talk to about this.     On review of symptoms, pt notes dizziness even when she sits or laying down, most of the time. This is not stopping her from doing activities. She will feel light headed, no room spinning. She still has headaches but less than before. She is fatigued and has less pain. She still has hot flashes. She notes discoloration of her tongue and will hurt when she eats spicy or salty food.    MEDICAL HISTORY:  Past Medical History:   Diagnosis Date  . Breast cancer ([place])   . Family history of cancer   . Genetic testing 04/29/2017   Common Cancers panel (47 genes) @ [facility] - No pathoagenic mutations detected    SURGICAL HISTORY: Past Surgical History:  Procedure Laterality Date  . IR FLUORO GUIDE PORT INSERTION RIGHT  05/03/2017  . IR US GUIDE VASC ACCESS RIGHT  05/03/2017    SOCIAL HISTORY: Social History   Socioeconomic History  . Marital status: Single    Spouse name: Not on file  . Number of children: Not on file  . Years of education: Not on file  . Highest education level: Not on file  Occupational History  . 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 Financial resource strain: Not on file  . Food insecurity:    Worry: Not on file    Inability: Not on file  . Transportation needs:    Medical: Not on file    Non-medical: Not on file  Tobacco Use  . Smoking status: Never Smoker  . Smokeless tobacco: Never Used  Substance and Sexual Activity  . Alcohol use: No    Frequency: Never  . Drug use: No  . Sexual activity: Never    Birth control/protection: None  Lifestyle  . Physical activity:    Days per week: 3 days    Minutes per session: 30 min  . Stress: Not at all  Relationships  . Social connections:    Talks on phone: More than three times a week    Gets together: More than three times a week    Attends religious service: 1 to 4 times per year    Active member of club or organization: No    Attends meetings of clubs or organizations: Never    Relationship status: Never married  . Intimate partner violence:    Fear of current or ex partner: No    Emotionally abused: No    Physically abused: No    Forced sexual activity: No  Other Topics Concern  . Not on file  Social History Narrative  . Not on file    FAMILY HISTORY: Family History  Problem Relation Age of Onset  . Hyperlipidemia Mother   . Hypertension Mother   . Prostate cancer Father 87       currently 64  . Stomach  cancer Paternal Aunt 51       deceased 61s  . Prostate cancer Paternal Uncle 73       deceased 20    ALLERGIES:  is allergic to heparin.  MEDICATIONS:  Current Outpatient Medications  Medication Sig Dispense Refill  . ALPRAZolam (XANAX) 0.25 MG tablet Take 1 tablet (  0.25 mg total) by mouth at bedtime as needed for anxiety. 30 tablet 0  . ibuprofen (ADVIL,MOTRIN) 200 MG tablet Take 2 tablets (400 mg total) by mouth every 6 (six) hours as needed. 30 tablet 0  . lidocaine-prilocaine (EMLA) cream Apply to affected area once 30 g 3  . LORazepam (ATIVAN) 0.5 MG tablet Take 1 tablet (0.5 mg total) by mouth every 8 (eight) hours as needed (nausea, vomiting). 20 tablet 0  . ondansetron (ZOFRAN) 8 MG tablet Take 1 tablet (8 mg total) by mouth 2 (two) times daily as needed. Start on the third day after chemotherapy. 30 tablet 1  . prochlorperazine (COMPAZINE) 10 MG tablet Take 1 tablet (10 mg total) by mouth every 6 (six) hours as needed (Nausea or vomiting). 30 tablet 1  . traMADol (ULTRAM) 50 MG tablet Take 1 tablet (50 mg total) by mouth every 6 (six) hours as needed. 20 tablet 0   No current facility-administered medications for this visit.    Facility-Administered Medications Ordered in Other Visits  Medication Dose Route Frequency Provider Last Rate Last Dose  . sodium chloride flush (NS) 0.9 % injection 10 mL  10 mL Intracatheter PRN [name], MD   10 mL at 07/22/17 1500    REVIEW OF SYSTEMS:   Constitutional: Denies fevers, chills or abnormal night sweats (+) increased fatigue (+) headaches, dizziness (+) hot flashes Eyes: Denies blurriness of vision, double vision or watery eyes Ears, nose, mouth, throat, and face: Denies mucositis or sore throat  (+) tongue discoloration with sensitivity.  Respiratory: Denies cough, dyspnea or wheezes Cardiovascular: Denies palpitation, chest discomfort or lower extremity swelling   Gastrointestinal:  Denies heartburn or change in bowel habits     Skin: Denies abnormal skin rashes Lymphatics: Denies new lymphadenopathy or easy bruising Neurological:Denies numbness, tingling or new weaknesses Behavioral/Psych: Mood is stable, no new changes  All other systems were reviewed with the patient and are negative.  PHYSICAL EXAMINATION: ECOG PERFORMANCE STATUS: 1 - Symptomatic but completely ambulatory  Vitals:   07/22/17 1005 07/22/17 1012  BP: 126/76   Pulse: (!) 105 90  Resp: 18   Temp: 98.3 F (36.8 C)   SpO2: 100%    Filed Weights   07/22/17 1005  Weight: 202 lb 3.2 oz (91.7 kg)    GENERAL:alert, no distress and comfortable SKIN: skin color, texture, turgor are normal, no rashes or significant lesions EYES: normal, conjunctiva are pink and non-injected, sclera clear OROPHARYNX:no exudate, no erythema and lips, buccal mucosa, and tongue normal  NECK: supple, thyroid normal size, non-tender, without nodularity LYMPH:  no palpable lymphadenopathy in the cervical, axillary or inguinal LUNGS: clear to auscultation and percussion with normal breathing effort HEART: regular rate & rhythm and no murmurs and no lower extremity edema ABDOMEN:abdomen soft, non-tender and normal bowel sounds Musculoskeletal:no cyanosis of digits and no clubbing  PSYCH: alert & oriented x 3 with fluent speech NEURO: no focal motor/sensory deficits BREAST: (+) Left Lower Quadrant mass is now no longer palpable. (+) Less prominent soft tissue fullness at 1cm in LUO quadrant 1:00-2:00 of left breast, non-tender (+) No palpable lymph nodes. Right breast exam benign   LABORATORY DATA:  I have reviewed the data as listed CBC Latest Ref Rng & Units 07/22/2017 07/15/2017 07/08/2017  WBC 3.9 - 10.3 K/uL 4.0 4.9 14.7(H)  Hemoglobin 12.0 - 15.0 g/dL - - -  Hematocrit 34.8 - 46.6 % 26.9(L) 26.1(L) 28.9(L)  Platelets 145 - 400 K/uL 271 372 196  CMP Latest Ref Rng & Units 07/22/2017 07/15/2017 07/08/2017  Glucose 70 - 140 mg/dL 163(H) 119 107  BUN 7 - 26 mg/dL 9  10 10   Creatinine 0.60 - 1.10 mg/dL 0.75 0.72 0.71  Sodium 136 - 145 mmol/L 139 140 140  Potassium 3.5 - 5.1 mmol/L 3.7 3.8 4.0  Chloride 98 - 109 mmol/L 106 107 108  CO2 22 - 29 mmol/L 24 26 24   Calcium 8.4 - 10.4 mg/dL 9.9 9.4 9.7  Total Protein 6.4 - 8.3 g/dL 7.1 6.9 7.1  Total Bilirubin 0.2 - 1.2 mg/dL 0.5 0.4 0.3  Alkaline Phos 40 - 150 U/L 91 92 120  AST 5 - 34 U/L 37(H) 35(H) 19  ALT 0 - 55 U/L 56(H) 48 20   PATHOLOGY  Diagnosis 05/10/17 1. Cervix, biopsy, 11 o'clock - ATYPICAL SQUAMOUS METAPLASIA ASSOCIATED WITH INFLAMMATION. - SEE COMMENT. 2. Endocervix, curettage - DETACHED FRAGMENTS OF SQUAMOUS MUCOSA WITH SLIGHT ATYPIA. - BENIGN ENDOCERVICAL MUCOSA. - SEE COMMENT. Microscopic Comment 1. The sections show multiple fragments of mostly metaplastic squamous mucosa displaying prominent chronic and acute inflammation to a lesser extent. This is associated with epithelial atypia in the form of nuclear enlargement, and small nucleoli. The inflammatory process obscures the epithelium which is somewhat difficult to evaluate. A p16 stain was performed and is negative. Overall, definitive or diagnostic squamous intraepithelial lesion is not appreciated in this setting, and the changes may represent a florid reactive state. Nonetheless, clinical correlation and follow up is strongly recommended. 2. The sections show multiple detached fragments of squamous mucosa, some of which display slight atypia. This is admixed with multiple fragments of benign endocervical mucosa. A p16 stain was performed and is negative. The overall changes are not specific or diagnostic of a squamous intraepithelial lesion. Clinical correlation and follow up is recommended. (BNS:ah 05/12/17)  GYNECOLOGIC CYTOLOGY REPORT Adequacy Reason 05/10/17 Satisfactory for evaluation, endocervical/transformation zone component PRESENT. Diagnosis NEGATIVE FOR INTRAEPITHELIAL LESIONS OR MALIGNANCY. BENIGN  REACTIVE/REPARATIVE CHANGES.    Diagnosis 04/19/17 1. Breast, left, needle core biopsy, 4:00 o'clock, ribbon clip - INVASIVE DUCTAL CARCINOMA. - SEE COMMENT. 2. Breast, left, needle core biopsy, 2:00 o'clock, coil clip - INVASIVE DUCTAL CARCINOMA. - SEE COMMENT. 3. Lymph node, needle/core biopsy, left axilla, spiral hydromark - DUCTAL CARCINOMA. - SEE COMMENT. Microscopic Comment 1. The carcinoma in the three specimens is morphologically similar and is grade III. Lymph nodal tissue is not definitively identified in specimen #3. A breast prognostic profile will be performed on part 2 and the results reported separately. 2. FLUORESCENCE IN-SITU HYBRIDIZATION Results: HER2 - NEGATIVE RATIO OF HER2/CEP17 SIGNALS 1.53 AVERAGE HER2 COPY NUMBER PER CELL 2.75 2. PROGNOSTIC INDICATORS Results: IMMUNOHISTOCHEMICAL AND MORPHOMETRIC ANALYSIS PERFORMED MANUALLY Estrogen Receptor: 0%, NEGATIVE Progesterone Receptor: 0%, NEGATIVE Proliferation Marker Ki67: 80% COMMENT: The negative hormone receptor study(ies) in this case has An internal positive control.    RADIOGRAPHIC STUDIES: I have personally reviewed the radiological images as listed and agreed with the findings in the report. No results found.  ASSESSMENT & PLAN:  Rashada Klontz a 22 y.o. 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  -  She presented with a palpable left breast mass, measures 5.2 cm on ultrasound, with positive lymph node. Breast tumor biopsy showed triple negative breast ductal  carcinoma. -Her staging CT and bone scan was negative for distant metastasis. -She was seen by Dr. Barry Dienes on 1/7 who feels she is potentially a candidate for breast conserving surgery but more likely will need at least left mastectomy if not bilateral mastectomies due to her young age, tumor size, and risk of recurrence.   -Given her clinical stage IIIC, triple negative disease, I recommended neoadjuvant regimen consisting of AC every 2 weeks for 4 cycles+ carboplatin/Taxol weekly for 12 cycles, followed by surgery then radiation. Side effects were discussed in great detail with her. She is agreed to proceed.  --Baseline echo on 04/30/17 normal with EF 60-65%.  -Genetic testing was negative for pathogenic mutation. 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 She will continue monthly zoladex injections for the duration of chemotherapy to suppress ovarian function; first dose given 1/11. -She completed 4 cycles neoadjuvant AC with neulasta from 05/12/17 - 06/24/17; she tolerated well overall.  -She is started neoadjuvant weekly taxol on 07/08/17 and developed severe generalized body aches and cramps, not responding to NSAIDs.  -I recommended changing to weekly Abraxane. In addition, given her aggressive triple negative T3N1 disease, will add carboplatin to her weekly regimen. We reviewed side effects such as increased fatigue, neuropathy, and hematologic toxicities; she agrees to proceed and started with cycle 2. -Her 07/15/17 exam showed a soft tissue fullness at the 12:00 of left breast, smaller on today's exam, will continue exam her. -She was prescribed Tramadol for her pain on 07/15/17 -She has mild discoloration of her tongue and sensitivity, I recommend she use baking soda and salt water mouth rinse 2-3 times a day and encouraged her to avoid alcohol based mouth washes.  -Labs reviewed, Hg 9.3 but no need for blood transfusion. Overall labs  adequate to proceed with carbo/Abraxane today -She has seen plastic surgeon about breast reconstruction.  She is very reluctant about mastectomy and reconstruction, I will speak with Dr. Barry Dienes about possibly lumpectomy instead of mastectomy if she has great response to neoadjuvant chemotherapy.  -F/u next week   2.  Headaches, dizziness  -When she was diagnosed with breast cancer, she has 1 month history of daily headaches with associated dizziness, pain ranges from 5/10 - 9/10 on the crown of her head, ibuprofen helps some; she can continue this or take tylenol PRN, neuro exam was unremarkable -Given the aggressive nature of triple negative disease, I obtained brain MRI on 05/05/17 which showed no evidence of brain mets.  -Will monitor while on chemo -Returned with change in chemo to Abraxane and Carbo. Will monitor.   3.  Anxiety  -She has considerable anxiety about her new diagnosis and treatment plan  -I previously prescribed low dose xanax for her to take PRN for anxiety especially at night if she has difficulty sleeping -Stable   4.  Social issues -She lives with her family and has their support; does not work or drive -She does not currently have Scientist, product/process development, has been using Counselling psychologist -We have included breast Engineer, site, social work, Clinical biochemist, and financial advocate for moral support and ongoing needs.   5.  Genetics  -Due to her young age and family history of prostate cancer, she has been referred to genetics -05/11/17 Genetic testing was negative for pathogenic mutation  6. Fertility planning  -We discussed  the risk of infertility secondary to chemotherapy, she wishes to preserve fertility  -I have referred her to [facility] fertility specialist, initial appointment was on 1/11  -After discussion between the patient and her mother they decided not to pursue fertility preservation through [facility] -We have started her on Zoladex injection on 04/30/17, which hopefully will  help to reduce the risk of chemo-induced infertility. -Continue monthly Zoladex injections    7. Irregular vaginal bleeding -She has 2 month history of irregular vaginal bleeding when she was diagnosed with breast cancer -04/15/18 PAP with atypical squamous cells.  -She underwent colposcopy on 05/10/17 with [facility]&C and results were negative for malignancy but with abnormal pap smear of cervix.  -Will continue to follow up with GYN.    8. Hot flashes, secondary to zoladex and chemotherapy  -She developed hot flashes after cycle 2, occurring 3 times per day lasting 1-2 minutes each.  -She is not interested in additional medication to manage this symptom. Will monitor -Tolerable overall   9. Peripheral neuropathy, G1, secondary to chemotherapy  -She developed mild intermittent numbness to fingertips after cycle 3 AC. Function is not limited.  No sensory deficits.   -previously reviewed that this can worsen on Taxol/carbo regimen and will monitor closely.  -If neuropathy should get worse on further chemo I discussed possible need to reduce dose or amend her treatment plan. She understands. -Overall stable, will monitor   PLAN:   -Labs adequate to proceed with chemo cycle 3 Abraxane/Carbo today  -Lab, flush, f/u and Carbo/Abraxane in 1 week -I will speak with Dr. Barry Dienes about her surgery   No orders of the defined types were placed in this encounter.     All questions were answered. The patient knows to call the clinic with any problems, questions or concerns. I spent 20 minutes counseling the patient face to face. The total time spent in the appointment was 25 minutes and more than 50% was on counseling.     [name], MD 07/22/2017 5:02 PM  This document serves as a record of services personally performed by [name], MD. It was created on her behalf by Joslyn Devon, a trained medical scribe. The creation of this record is based on the scribe's personal observations and the provider's  statements to them.   I have reviewed the above documentation for accuracy and completeness, and I agree with the above.

## 2017-07-22 ENCOUNTER — Inpatient Hospital Stay: Payer: Medicaid Other

## 2017-07-22 ENCOUNTER — Encounter: Payer: Self-pay | Admitting: Genetic Counselor

## 2017-07-22 ENCOUNTER — Inpatient Hospital Stay: Payer: Medicaid Other | Attending: Nurse Practitioner

## 2017-07-22 ENCOUNTER — Inpatient Hospital Stay (HOSPITAL_BASED_OUTPATIENT_CLINIC_OR_DEPARTMENT_OTHER): Payer: Medicaid Other | Admitting: Hematology

## 2017-07-22 VITALS — BP 126/76 | HR 90 | Temp 98.3°F | Resp 18 | Ht 64.0 in | Wt 202.2 lb

## 2017-07-22 DIAGNOSIS — Z5111 Encounter for antineoplastic chemotherapy: Secondary | ICD-10-CM | POA: Diagnosis present

## 2017-07-22 DIAGNOSIS — G620 Drug-induced polyneuropathy: Secondary | ICD-10-CM

## 2017-07-22 DIAGNOSIS — Z95828 Presence of other vascular implants and grafts: Secondary | ICD-10-CM

## 2017-07-22 DIAGNOSIS — F418 Other specified anxiety disorders: Secondary | ICD-10-CM | POA: Diagnosis not present

## 2017-07-22 DIAGNOSIS — C50812 Malignant neoplasm of overlapping sites of left female breast: Secondary | ICD-10-CM

## 2017-07-22 DIAGNOSIS — R51 Headache: Secondary | ICD-10-CM

## 2017-07-22 DIAGNOSIS — R42 Dizziness and giddiness: Secondary | ICD-10-CM | POA: Diagnosis not present

## 2017-07-22 DIAGNOSIS — N951 Menopausal and female climacteric states: Secondary | ICD-10-CM | POA: Diagnosis not present

## 2017-07-22 DIAGNOSIS — C773 Secondary and unspecified malignant neoplasm of axilla and upper limb lymph nodes: Secondary | ICD-10-CM | POA: Insufficient documentation

## 2017-07-22 DIAGNOSIS — Z171 Estrogen receptor negative status [ER-]: Secondary | ICD-10-CM

## 2017-07-22 DIAGNOSIS — N939 Abnormal uterine and vaginal bleeding, unspecified: Secondary | ICD-10-CM

## 2017-07-22 LAB — CBC WITH DIFFERENTIAL (CANCER CENTER ONLY)
BASOS PCT: 2 %
Basophils Absolute: 0.1 10*3/uL (ref 0.0–0.1)
Eosinophils Absolute: 0 10*3/uL (ref 0.0–0.5)
Eosinophils Relative: 1 %
HEMATOCRIT: 26.9 % — AB (ref 34.8–46.6)
Hemoglobin: 9.3 g/dL — ABNORMAL LOW (ref 11.6–15.9)
LYMPHS ABS: 0.8 10*3/uL — AB (ref 0.9–3.3)
Lymphocytes Relative: 20 %
MCH: 30.8 pg (ref 25.1–34.0)
MCHC: 34.4 g/dL (ref 31.5–36.0)
MCV: 89.5 fL (ref 79.5–101.0)
MONOS PCT: 7 %
Monocytes Absolute: 0.3 10*3/uL (ref 0.1–0.9)
NEUTROS ABS: 2.8 10*3/uL (ref 1.5–6.5)
Neutrophils Relative %: 70 %
Platelet Count: 271 10*3/uL (ref 145–400)
RBC: 3.01 MIL/uL — ABNORMAL LOW (ref 3.70–5.45)
RDW: 17.2 % — AB (ref 11.2–14.5)
WBC Count: 4 10*3/uL (ref 3.9–10.3)

## 2017-07-22 LAB — CMP (CANCER CENTER ONLY)
ALBUMIN: 3.6 g/dL (ref 3.5–5.0)
ALK PHOS: 91 U/L (ref 40–150)
ALT: 56 U/L — AB (ref 0–55)
ANION GAP: 9 (ref 3–11)
AST: 37 U/L — ABNORMAL HIGH (ref 5–34)
BUN: 9 mg/dL (ref 7–26)
CALCIUM: 9.9 mg/dL (ref 8.4–10.4)
CO2: 24 mmol/L (ref 22–29)
Chloride: 106 mmol/L (ref 98–109)
Creatinine: 0.75 mg/dL (ref 0.60–1.10)
GFR, Est AFR Am: >60 mL/min (ref >60)
GFR, Estimated: >60 mL/min (ref >60)
GLUCOSE: 163 mg/dL — AB (ref 70–140)
Potassium: 3.7 mmol/L (ref 3.5–5.1)
SODIUM: 139 mmol/L (ref 136–145)
Total Bilirubin: 0.5 mg/dL (ref 0.2–1.2)
Total Protein: 7.1 g/dL (ref 6.4–8.3)

## 2017-07-22 MED ORDER — SODIUM CHLORIDE 0.9 % IV SOLN
Freq: Once | INTRAVENOUS | Status: AC
  Administered 2017-07-22: 11:00:00 via INTRAVENOUS

## 2017-07-22 MED ORDER — DEXAMETHASONE SODIUM PHOSPHATE 10 MG/ML IJ SOLN
10.0000 mg | Freq: Once | INTRAMUSCULAR | Status: AC
  Administered 2017-07-22: 10 mg via INTRAVENOUS

## 2017-07-22 MED ORDER — GOSERELIN ACETATE 3.6 MG ~~LOC~~ IMPL
DRUG_IMPLANT | SUBCUTANEOUS | Status: AC
  Filled 2017-07-22: qty 3.6

## 2017-07-22 MED ORDER — DEXAMETHASONE SODIUM PHOSPHATE 10 MG/ML IJ SOLN
INTRAMUSCULAR | Status: AC
  Filled 2017-07-22: qty 1

## 2017-07-22 MED ORDER — GOSERELIN ACETATE 3.6 MG ~~LOC~~ IMPL
3.6000 mg | DRUG_IMPLANT | Freq: Once | SUBCUTANEOUS | Status: AC
  Administered 2017-07-22: 3.6 mg via SUBCUTANEOUS

## 2017-07-22 MED ORDER — SODIUM CHLORIDE 0.9 % IV SOLN
300.0000 mg | Freq: Once | INTRAVENOUS | Status: AC
  Administered 2017-07-22: 300 mg via INTRAVENOUS
  Filled 2017-07-22: qty 30

## 2017-07-22 MED ORDER — ANTICOAGULANT SODIUM CITRATE 4% (200MG/5ML) IV SOLN
5.0000 mL | Freq: Once | Status: AC
  Administered 2017-07-22: 5 mL via INTRAVENOUS
  Filled 2017-07-22: qty 5

## 2017-07-22 MED ORDER — PALONOSETRON HCL INJECTION 0.25 MG/5ML
INTRAVENOUS | Status: AC
  Filled 2017-07-22: qty 5

## 2017-07-22 MED ORDER — PACLITAXEL PROTEIN-BOUND CHEMO INJECTION 100 MG
80.0000 mg/m2 | Freq: Once | INTRAVENOUS | Status: AC
  Administered 2017-07-22: 150 mg via INTRAVENOUS
  Filled 2017-07-22: qty 30

## 2017-07-22 MED ORDER — FAMOTIDINE IN NACL 20-0.9 MG/50ML-% IV SOLN
INTRAVENOUS | Status: AC
  Filled 2017-07-22: qty 50

## 2017-07-22 MED ORDER — FAMOTIDINE IN NACL 20-0.9 MG/50ML-% IV SOLN: 20.0000 mg | Freq: Once | INTRAVENOUS | Status: DC

## 2017-07-22 MED ORDER — PALONOSETRON HCL INJECTION 0.25 MG/5ML
0.2500 mg | Freq: Once | INTRAVENOUS | Status: AC
  Administered 2017-07-22: 0.25 mg via INTRAVENOUS

## 2017-07-22 MED ORDER — SODIUM CHLORIDE 0.9% FLUSH
10.0000 mL | Freq: Once | INTRAVENOUS | Status: AC
  Administered 2017-07-22: 10 mL
  Filled 2017-07-22: qty 10

## 2017-07-22 MED ORDER — DIPHENHYDRAMINE HCL 50 MG/ML IJ SOLN
INTRAMUSCULAR | Status: AC
  Filled 2017-07-22: qty 1

## 2017-07-22 MED ORDER — DIPHENHYDRAMINE HCL 50 MG/ML IJ SOLN: 50.0000 mg | Freq: Once | INTRAMUSCULAR | Status: DC

## 2017-07-22 MED ORDER — SODIUM CHLORIDE 0.9% FLUSH
10.0000 mL | INTRAVENOUS | Status: DC | PRN
  Administered 2017-07-22: 10 mL
  Filled 2017-07-22: qty 10

## 2017-07-22 NOTE — Patient Instructions (Signed)
[facility] Discharge Instructions for Patients Receiving Chemotherapy  Today you received the following chemotherapy agents: Abraxane, Carboplatin  To help prevent nausea and vomiting after your treatment, we encourage you to take your nausea medication as directed.   If you develop nausea and vomiting that is not controlled by your nausea medication, call the clinic.   BELOW ARE SYMPTOMS THAT SHOULD BE REPORTED IMMEDIATELY:  *FEVER GREATER THAN 100.5 F  *CHILLS WITH OR WITHOUT FEVER  NAUSEA AND VOMITING THAT IS NOT CONTROLLED WITH YOUR NAUSEA MEDICATION  *UNUSUAL SHORTNESS OF BREATH  *UNUSUAL BRUISING OR BLEEDING  TENDERNESS IN MOUTH AND THROAT WITH OR WITHOUT PRESENCE OF ULCERS  *URINARY PROBLEMS  *BOWEL PROBLEMS  UNUSUAL RASH Items with * indicate a potential emergency and should be followed up as soon as possible.  Feel free to call the clinic should you have any questions or concerns. The clinic phone number is [phone].  Please show the CHEMO ALERT CARD at check-in to the Emergency Department and triage nurse.   

## 2017-07-22 NOTE — Progress Notes (Signed)
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 A copy of this new report will be scanned into Epic and Carrie Miller will receive a copy of this new report.   Steele Berg, MS, [place] Certified Genetic Counselor phone: [phone] [email or web address]

## 2017-07-29 ENCOUNTER — Inpatient Hospital Stay: Payer: Medicaid Other

## 2017-07-29 ENCOUNTER — Encounter: Payer: Self-pay | Admitting: Nurse Practitioner

## 2017-07-29 ENCOUNTER — Inpatient Hospital Stay (HOSPITAL_BASED_OUTPATIENT_CLINIC_OR_DEPARTMENT_OTHER): Payer: Medicaid Other | Admitting: Nurse Practitioner

## 2017-07-29 ENCOUNTER — Telehealth: Payer: Self-pay

## 2017-07-29 VITALS — BP 121/77 | HR 90 | Temp 97.8°F | Resp 18 | Ht 64.0 in | Wt 205.0 lb

## 2017-07-29 DIAGNOSIS — C773 Secondary and unspecified malignant neoplasm of axilla and upper limb lymph nodes: Secondary | ICD-10-CM | POA: Diagnosis not present

## 2017-07-29 DIAGNOSIS — Z5111 Encounter for antineoplastic chemotherapy: Secondary | ICD-10-CM | POA: Diagnosis not present

## 2017-07-29 DIAGNOSIS — C50812 Malignant neoplasm of overlapping sites of left female breast: Secondary | ICD-10-CM

## 2017-07-29 DIAGNOSIS — R51 Headache: Secondary | ICD-10-CM

## 2017-07-29 DIAGNOSIS — R42 Dizziness and giddiness: Secondary | ICD-10-CM | POA: Diagnosis not present

## 2017-07-29 DIAGNOSIS — F419 Anxiety disorder, unspecified: Secondary | ICD-10-CM | POA: Diagnosis not present

## 2017-07-29 DIAGNOSIS — Z171 Estrogen receptor negative status [ER-]: Secondary | ICD-10-CM | POA: Diagnosis not present

## 2017-07-29 DIAGNOSIS — G620 Drug-induced polyneuropathy: Secondary | ICD-10-CM

## 2017-07-29 DIAGNOSIS — T451X5A Adverse effect of antineoplastic and immunosuppressive drugs, initial encounter: Secondary | ICD-10-CM

## 2017-07-29 DIAGNOSIS — D6481 Anemia due to antineoplastic chemotherapy: Secondary | ICD-10-CM

## 2017-07-29 DIAGNOSIS — N951 Menopausal and female climacteric states: Secondary | ICD-10-CM

## 2017-07-29 DIAGNOSIS — N939 Abnormal uterine and vaginal bleeding, unspecified: Secondary | ICD-10-CM

## 2017-07-29 LAB — CMP (CANCER CENTER ONLY)
ALBUMIN: 3.6 g/dL (ref 3.5–5.0)
ALT: 54 U/L (ref 0–55)
ANION GAP: 8 (ref 3–11)
AST: 39 U/L — AB (ref 5–34)
Alkaline Phosphatase: 86 U/L (ref 40–150)
BUN: 8 mg/dL (ref 7–26)
CHLORIDE: 108 mmol/L (ref 98–109)
CO2: 23 mmol/L (ref 22–29)
Calcium: 9.5 mg/dL (ref 8.4–10.4)
Creatinine: 0.68 mg/dL (ref 0.60–1.10)
GFR, Est AFR Am: >60 mL/min (ref >60)
GFR, Estimated: >60 mL/min (ref >60)
GLUCOSE: 93 mg/dL (ref 70–140)
POTASSIUM: 4.2 mmol/L (ref 3.5–5.1)
SODIUM: 139 mmol/L (ref 136–145)
Total Bilirubin: 0.4 mg/dL (ref 0.2–1.2)
Total Protein: 7 g/dL (ref 6.4–8.3)

## 2017-07-29 LAB — CBC WITH DIFFERENTIAL (CANCER CENTER ONLY)
BASOS ABS: 0 10*3/uL (ref 0.0–0.1)
Basophils Relative: 1 %
EOS PCT: 1 %
Eosinophils Absolute: 0.1 10*3/uL (ref 0.0–0.5)
HEMATOCRIT: 26 % — AB (ref 34.8–46.6)
Hemoglobin: 9 g/dL — ABNORMAL LOW (ref 11.6–15.9)
LYMPHS ABS: 0.9 10*3/uL (ref 0.9–3.3)
LYMPHS PCT: 20 %
MCH: 31.6 pg (ref 25.1–34.0)
MCHC: 34.7 g/dL (ref 31.5–36.0)
MCV: 91 fL (ref 79.5–101.0)
Monocytes Absolute: 0.4 10*3/uL (ref 0.1–0.9)
Monocytes Relative: 8 %
NEUTROS ABS: 3 10*3/uL (ref 1.5–6.5)
Neutrophils Relative %: 70 %
PLATELETS: 278 10*3/uL (ref 145–400)
RBC: 2.86 MIL/uL — ABNORMAL LOW (ref 3.70–5.45)
RDW: 18.1 % — AB (ref 11.2–14.5)
WBC Count: 4.4 10*3/uL (ref 3.9–10.3)

## 2017-07-29 MED ORDER — PALONOSETRON HCL INJECTION 0.25 MG/5ML
INTRAVENOUS | Status: AC
  Filled 2017-07-29: qty 5

## 2017-07-29 MED ORDER — SODIUM CHLORIDE 0.9% FLUSH
10.0000 mL | INTRAVENOUS | Status: DC | PRN
  Administered 2017-07-29: 10 mL
  Filled 2017-07-29: qty 10

## 2017-07-29 MED ORDER — SODIUM CHLORIDE 0.9 % IV SOLN
300.0000 mg | Freq: Once | INTRAVENOUS | Status: AC
  Administered 2017-07-29: 300 mg via INTRAVENOUS
  Filled 2017-07-29: qty 30

## 2017-07-29 MED ORDER — DEXAMETHASONE SODIUM PHOSPHATE 10 MG/ML IJ SOLN
10.0000 mg | Freq: Once | INTRAMUSCULAR | Status: AC
Start: 2017-07-29 — End: 2017-07-29
  Administered 2017-07-29: 10 mg via INTRAVENOUS

## 2017-07-29 MED ORDER — PALONOSETRON HCL INJECTION 0.25 MG/5ML
0.2500 mg | Freq: Once | INTRAVENOUS | Status: AC
  Administered 2017-07-29: 0.25 mg via INTRAVENOUS

## 2017-07-29 MED ORDER — SODIUM CHLORIDE 0.9 % IV SOLN
Freq: Once | INTRAVENOUS | Status: AC
  Administered 2017-07-29: 11:00:00 via INTRAVENOUS

## 2017-07-29 MED ORDER — DEXAMETHASONE SODIUM PHOSPHATE 10 MG/ML IJ SOLN
INTRAMUSCULAR | Status: AC
  Filled 2017-07-29: qty 1

## 2017-07-29 MED ORDER — PACLITAXEL PROTEIN-BOUND CHEMO INJECTION 100 MG
80.0000 mg/m2 | Freq: Once | INTRAVENOUS | Status: AC
  Administered 2017-07-29: 150 mg via INTRAVENOUS
  Filled 2017-07-29: qty 30

## 2017-07-29 MED ORDER — ANTICOAGULANT SODIUM CITRATE 4% (200MG/5ML) IV SOLN
5.0000 mL | Freq: Once | Status: AC
Start: 2017-07-29 — End: 2017-07-29
  Administered 2017-07-29: 5 mL via INTRAVENOUS
  Filled 2017-07-29: qty 5

## 2017-07-29 NOTE — Patient Instructions (Signed)
Implanted Port Home Guide An implanted port is a type of central line that is placed under the skin. Central lines are used to provide IV access when treatment or nutrition needs to be given through a person's veins. Implanted ports are used for long-term IV access. An implanted port may be placed because:  You need IV medicine that would be irritating to the small veins in your hands or arms.  You need long-term IV medicines, such as antibiotics.  You need IV nutrition for a long period.  You need frequent blood draws for lab tests.  You need dialysis.  Implanted ports are usually placed in the chest area, but they can also be placed in the upper arm, the abdomen, or the leg. An implanted port has two main parts:  Reservoir. The reservoir is round and will appear as a small, raised area under your skin. The reservoir is the part where a needle is inserted to give medicines or draw blood.  Catheter. The catheter is a thin, flexible tube that extends from the reservoir. The catheter is placed into a large vein. Medicine that is inserted into the reservoir goes into the catheter and then into the vein.  How will I care for my incision site? Do not get the incision site wet. Bathe or shower as directed by your health care provider. How is my port accessed? Special steps must be taken to access the port:  Before the port is accessed, a numbing cream can be placed on the skin. This helps numb the skin over the port site.  Your health care provider uses a sterile technique to access the port. ? Your health care provider must put on a mask and sterile gloves. ? The skin over your port is cleaned carefully with an antiseptic and allowed to dry. ? The port is gently pinched between sterile gloves, and a needle is inserted into the port.  Only "non-coring" port needles should be used to access the port. Once the port is accessed, a blood return should be checked. This helps ensure that the port  is in the vein and is not clogged.  If your port needs to remain accessed for a constant infusion, a clear (transparent) bandage will be placed over the needle site. The bandage and needle will need to be changed every week, or as directed by your health care provider.  Keep the bandage covering the needle clean and dry. Do not get it wet. Follow your health care provider's instructions on how to take a shower or bath while the port is accessed.  If your port does not need to stay accessed, no bandage is needed over the port.  What is flushing? Flushing helps keep the port from getting clogged. Follow your health care provider's instructions on how and when to flush the port. Ports are usually flushed with saline solution or a medicine called heparin. The need for flushing will depend on how the port is used.  If the port is used for intermittent medicines or blood draws, the port will need to be flushed: ? After medicines have been given. ? After blood has been drawn. ? As part of routine maintenance.  If a constant infusion is running, the port may not need to be flushed.  How long will my port stay implanted? The port can stay in for as long as your health care provider thinks it is needed. When it is time for the port to come out, surgery will be   done to remove it. The procedure is similar to the one performed when the port was put in. When should I seek immediate medical care? When you have an implanted port, you should seek immediate medical care if:  You notice a bad smell coming from the incision site.  You have swelling, redness, or drainage at the incision site.  You have more swelling or pain at the port site or the surrounding area.  You have a fever that is not controlled with medicine.  This information is not intended to replace advice given to you by your health care provider. Make sure you discuss any questions you have with your health care provider. Document  Released: 04/06/2005 Document Revised: 09/12/2015 Document Reviewed: 12/12/2012 [employer] Interactive Patient Education  2017 [employer]  

## 2017-07-29 NOTE — Progress Notes (Signed)
[place]  [phone] [phone]  Clinic Follow up Note   Patient Care Team: Patient, No Pcp Per as PCP - General ([place]) [name], MD as Consulting Physician (General Surgery) [name], MD as Consulting Physician (Hematology) [name], NP as Nurse Practitioner (Nurse Practitioner) 07/29/2017  SUMMARY OF ONCOLOGIC HISTORY:   Cancer of overlapping sites of left breast ([place])   04/15/2017 Mammogram    IMPRESSION: 1. Highly suspicious irregular mass within the left breast at the 4 o'clock axis, 8 cm from the nipple, measuring 5.2 cm, corresponding to the area of clinical concern. Ultrasound-guided biopsy is recommended. 2. Additional suspicious irregular mass within the left breast at the 2 o'clock axis, 8 cm from the nipple, measuring 1.6 cm, corresponding to the mammographic finding. Ultrasound-guided biopsy is recommended. 3. Additional suspicious mass within the left breast at the 2 o'clock axis, 10 cm from the nipple, axillary tail region, measuring 3 cm, suspected lymph node completely replaced by tumor. Ultrasound-guided biopsy is recommended. 4. No evidence of malignancy within the right breast.       04/15/2017 Breast US    Left breast: Targeted ultrasound is performed, showing an irregular mass in the left breast at the 4 o'clock axis, 8 cm from the nipple, measuring 5.2 x 4.3 x 4.3 cm, with internal vascularity, corresponding to the mammographic finding and palpable lump.  There is an additional irregular hypoechoic mass in the left breast at the 2 o'clock axis, 8 cm from the nipple, measuring 1.6 x 1.3 x 1.3 cm, with internal vascularity, corresponding to the additional mass seen within the outer left breast on mammogram, suspected satellite mass.  Lastly, there is an oval circumscribed hypoechoic mass in the left breast at the 2 o'clock axis, 10 cm from the nipple, axillary tail region, with heterogeneous  echotexture, measuring 3 x 2.3 x 2.8 cm, suspected lymph node completely replaced by tumor.  Remainder of the left axilla was evaluated with ultrasound showing no additional enlarged or morphologically abnormal lymph nodes.  IMPRESSION: 1. Highly suspicious irregular mass within the left breast at the 4 o'clock axis, 8 cm from the nipple, measuring 5.2 cm, corresponding to the area of clinical concern. Ultrasound-guided biopsy is recommended. 2. Additional suspicious irregular mass within the left breast at the 2 o'clock axis, 8 cm from the nipple, measuring 1.6 cm, corresponding to the mammographic finding. Ultrasound-guided biopsy is recommended. 3. Additional suspicious mass within the left breast at the 2 o'clock axis, 10 cm from the nipple, axillary tail region, measuring 3 cm, suspected lymph node completely replaced by tumor. Ultrasound-guided biopsy is recommended. 4. No evidence of malignancy within the right breast.       04/19/2017 Initial Biopsy    Diagnosis 1. Breast, left, needle core biopsy, 4:00 o'clock, ribbon clip - INVASIVE DUCTAL CARCINOMA. - SEE COMMENT. 2. Breast, left, needle core biopsy, 2:00 o'clock, coil clip - INVASIVE DUCTAL CARCINOMA. - SEE COMMENT. 3. Lymph node, needle/core biopsy, left axilla, spiral hydromark - DUCTAL CARCINOMA. - SEE COMMENT.  2. PROGNOSTIC INDICATORS Results: IMMUNOHISTOCHEMICAL AND MORPHOMETRIC ANALYSIS PERFORMED MANUALLY Estrogen Receptor: 0%, NEGATIVE Progesterone Receptor: 0%, NEGATIVE Proliferation Marker Ki67: 80%  2. FLUORESCENCE IN-SITU HYBRIDIZATION Results: HER2 - NEGATIVE RATIO OF HER2/CEP17 SIGNALS 1.53 AVERAGE HER2 COPY NUMBER PER CELL 2.75  Microscopic Comment 1. The carcinoma in the three specimens is morphologically similar and is grade III. Lymph nodal tissue is not definitively identified in specimen #3. A breast prognostic profile will be performed  on part 2 and the results reported separately.       04/28/2017 Initial Diagnosis    Cancer of overlapping sites of left breast ([name])      04/30/2017 Breast MRI    IMPRESSION: 1. Biopsy-proven invasive carcinoma within the lower outer quadrant of the LEFT breast, 4 o'clock axis, at posterior depth, measuring 4.8 cm, with associated biopsy clip artifact. 2. Biopsy-proven invasive carcinoma within the upper-outer quadrant of the LEFT breast, 2 o'clock axis, at posterior depth, measuring 2.5 cm, with associated biopsy clip artifact. However, contiguous non mass enhancement along the posterior margin of this 2 o'clock mass and extending 3 cm anteriorly from the mass increases the overall measurement to 5.5 cm greatest dimension (AP). 3. Diffuse edema throughout the LEFT breast, with particularly prominent component of edema extending posteriorly to abut the anterior surface of the pectoralis muscle, and diffuse skin thickening throughout the left breast. This almost certainly indicates INFLAMMATORY BREAST CANCER. 4. Biopsy-proven metastatic lymph node within the LEFT axilla measures 4.1 cm. Two additional borderline prominent lymph nodes within the left axilla. No enlarged lymph nodes within the right axilla or internal mammary chain regions. 5. No evidence of malignancy within the RIGHT breast.  RECOMMENDATION: Per current treatment plan for patient's known left breast cancer (2 biopsy-proven sites) and metastatic lymph node in the left axilla.  BI-RADS CATEGORY  6: Known biopsy-proven malignancy.       04/30/2017 Echocardiogram    Study Conclusions  - Left ventricle: The cavity size was normal. Wall thickness was   normal. Systolic function was normal. The estimated ejection   fraction was in the range of 60% to 65%. Wall motion was normal;   there were no regional wall motion abnormalities. Left   ventricular diastolic function parameters were normal. GLS:   -20.4%, RV S vel 12.1cm/s.      05/05/2017 Imaging    MRI BRAIN  IMPRESSION: No intracranial or calvarial metastatic disease. Normal MRI of the brain.      05/07/2017 Imaging    CT CAP IMPRESSION: Two left breast masses and a grossly abnormal 3.9 cm lower left axillary lymph node, likely correspond to multifocal left breast cancer metastatic to the left axilla. Skin thickening of the left breast, raising concern for an inflammatory breast cancer.  1 cm hypoattenuated lesion in the posterior dome of the liver with internal attenuation slightly higher than water. This may represent a complicated liver cyst, hemangioma or a focus of metastatic disease.  No other findings to suggest distal metastatic disease.       05/07/2017 Imaging    BONE SCAN IMPRESSION: 1. No evidence of osseous metastases. 2. Abnormal uptake in the soft tissues of the left breast likely by the patient's known breast cancer.       05/11/2017 Genetic Testing    Common Cancers panel (47 genes) @ [facility] - No pathoagenic mutations detected Five Variants of Uncertain Significance were detected:  APC c.2438A>G (p.Asn813Ser)  BARD1 c.782T>C (p.Leu261Pro) MSH3 c.1655C>T (p.Thr552Ile)  MSH6 c.2108T>C (p.Met703Thr)  NTHL1 c.470G>C (p.Arg157Pro)   Genes Analyzed: 47 genes on [facility]'s Common Cancers panel (APC, ATM, AXIN2, BARD1, BMPR1A, BRCA1, BRCA2, BRIP1, CDH1, CDK4, CDKN2A, CHEK2, CTNNA1, DICER1, EPCAM, GREM1, HOXB13, KIT, MEN1, MLH1, MSH2, MSH3, MSH6, MUTYH, NBN, NF1, NTHL1, PALB2, PDGFRA, PMS2, POLD1, POLE, PTEN, RAD50, RAD51C, RAD51D, SDHA, SDHB, SDHC, SDHD, SMAD4, SMARCA4, STK11, TP53, TSC1, TSC2, VHL).       05/12/2017 -  Chemotherapy    1. Neoadjuvant AC q2 weeks x4  cycles then weekly 05/12/17 - 06/24/17 followed by carbo/taxol for 12 weeks starting 07/08/17. Due to poor toleration, changed Taxol to abraxane and added Carboplatin with cycle 2.  2. Monthly zoladex injections starting 04/30/17 for the duration of chemotherapy.      CURRENT THERAPY:  1. Neoadjuvant AC q2  weeks x4 cycles then weekly 05/12/17 - 06/24/17 followed by carbo/taxol for 12 weeks starting 07/08/17. Due to poor toleration, changed Taxol to abraxane and added Carboplatin with cycle 2.  2. Monthly zoladex injections starting 04/30/17 for the duration of chemotherapy.   INTERVAL HISTORY: Ms. Short returns for follow up as scheduled prior to cycle 4 abraxane/carboplatin; s/p cycle 3 on 07/22/17 with zoladex. She tolerated cycle 3 better with improved fatigue and mouth sensitivity. Good appetite. No fever or chills. Takes compazine prophylactically to avoid nausea. Intermittent mild numbness to fingers is stable, not affecting function. Headaches and dizziness are overall improved from when she previously had this with initial treatment. Takes ibuprofen to control headaches, currently 4/10. Nighttime hot flashes are mild, intermittent, and stable overall. She is able to sleep.   REVIEW OF SYSTEMS:   Constitutional: Denies fevers, chills or abnormal weight loss (+) fatigue, improved Eyes: Denies blurriness of vision Ears, nose, mouth, throat, and face: Denies mucositis or sore throat (+) oral sensitivity, improved  Respiratory: Denies cough, dyspnea or wheezes Cardiovascular: Denies palpitation, chest discomfort or lower extremity swelling Gastrointestinal:  Denies nausea, vomiting, constipation, diarrhea, heartburn or change in bowel habits Skin: Denies abnormal skin rashes Lymphatics: Denies new lymphadenopathy or easy bruising Neurological:Denies  tingling or new weaknesses (+) mild intermittent numbness, stable (+) headaches, 4/10 currently but overall improved (+) intermitting dizziness with sitting, improved  Behavioral/Psych: Mood is stable, no new changes (+) denies anxiety  All other systems were reviewed with the patient and are negative.  MEDICAL HISTORY:  Past Medical History:  Diagnosis Date  . Breast cancer ([place])   . Family history of cancer   . Genetic testing 04/29/2017   Common  Cancers panel (47 genes) @ [facility] - No pathoagenic mutations detected    SURGICAL HISTORY: Past Surgical History:  Procedure Laterality Date  . IR FLUORO GUIDE PORT INSERTION RIGHT  05/03/2017  . IR US GUIDE VASC ACCESS RIGHT  05/03/2017    I have reviewed the social history and family history with the patient and they are unchanged from previous note.  ALLERGIES:  is allergic to heparin.  MEDICATIONS:  Current Outpatient Medications  Medication Sig Dispense Refill  . ALPRAZolam (XANAX) 0.25 MG tablet Take 1 tablet (0.25 mg total) by mouth at bedtime as needed for anxiety. 30 tablet 0  . ibuprofen (ADVIL,MOTRIN) 200 MG tablet Take 2 tablets (400 mg total) by mouth every 6 (six) hours as needed. 30 tablet 0  . lidocaine-prilocaine (EMLA) cream Apply to affected area once 30 g 3  . LORazepam (ATIVAN) 0.5 MG tablet Take 1 tablet (0.5 mg total) by mouth every 8 (eight) hours as needed (nausea, vomiting). 20 tablet 0  . ondansetron (ZOFRAN) 8 MG tablet Take 1 tablet (8 mg total) by mouth 2 (two) times daily as needed. Start on the third day after chemotherapy. 30 tablet 1  . prochlorperazine (COMPAZINE) 10 MG tablet Take 1 tablet (10 mg total) by mouth every 6 (six) hours as needed (Nausea or vomiting). 30 tablet 1  . traMADol (ULTRAM) 50 MG tablet Take 1 tablet (50 mg total) by mouth every 6 (six) hours as needed. 20 tablet 0  No current facility-administered medications for this visit.    Facility-Administered Medications Ordered in Other Visits  Medication Dose Route Frequency Provider Last Rate Last Dose  . anticoagulant sodium citrate solution 5 mL  5 mL Intravenous Once [name], MD      . sodium chloride flush (NS) 0.9 % injection 10 mL  10 mL Intracatheter PRN [name], MD        PHYSICAL EXAMINATION: ECOG PERFORMANCE STATUS: 1 - Symptomatic but completely ambulatory  Vitals:   07/29/17 0938  BP: 121/77  Pulse: 90  Resp: 18  Temp: 97.8 F (36.6 C)  SpO2: 100%   Filed  Weights   07/29/17 0938  Weight: 205 lb (93 kg)    GENERAL:alert, no distress and comfortable SKIN: skin color, texture, turgor are normal, no rashes or significant lesions EYES: normal, Conjunctiva are pink and non-injected, sclera clear OROPHARYNX:no exudate, no erythema and lips, buccal mucosa normal (+) discolored tongue  LYMPH:  no palpable cervical, supraclavicular, or axillary lymphadenopathy  LUNGS: clear to auscultation with normal breathing effort HEART: regular rate & rhythm and no murmurs and no lower extremity edema ABDOMEN:abdomen soft, non-tender and normal bowel sounds. No hepatomegaly  Musculoskeletal:no cyanosis of digits and no clubbing  NEURO: alert & oriented x 3 with fluent speech, no focal motor/sensory deficits BREASTS inspection shows them to be symmetrical without nipple discharge. Right breast exam benign. (+) initially palpable left breast masses in upper outer and lower outer quadrants remain not palpable on today's exam. (+) less prominent soft tissue fullness in left breast 1 o'clock - 2 o'clock is stable from previous exam.  PAC without erythema   LABORATORY DATA:  I have reviewed the data as listed CBC Latest Ref Rng & Units 07/29/2017 07/22/2017 07/15/2017  WBC 3.9 - 10.3 K/uL 4.4 4.0 4.9  Hemoglobin 12.0 - 15.0 g/dL - - -  Hematocrit 34.8 - 46.6 % 26.0(L) 26.9(L) 26.1(L)  Platelets 145 - 400 K/uL 278 271 372     CMP Latest Ref Rng & Units 07/29/2017 07/22/2017 07/15/2017  Glucose 70 - 140 mg/dL 93 163(H) 119  BUN 7 - 26 mg/dL _0 Creatinine 0.60 - 1.10 mg/dL 0.68 0.75 0.72  Sodium 136 - 145 mmol/L 139 139 140  Potassium 3.5 - 5.1 mmol/L 4.2 3.7 3.8  Chloride 98 - 109 mmol/L 108 106 107  CO2 22 - 29 mmol/L _1 Calcium 8.4 - 10.4 mg/dL 9.5 9.9 9.4  Total Protein 6.4 - 8.3 g/dL 7.0 7.1 6.9  Total Bilirubin 0.2 - 1.2 mg/dL 0.4 0.5 0.4  Alkaline Phos 40 - 150 U/L 86 91 92  AST 5 - 34 U/L 39(H) 37(H) 35(H)  ALT 0 - 55 U/L 54 56(H) 48    PATHOLOGY  Diagnosis 05/10/17 1. Cervix, biopsy, 11 o'clock - ATYPICAL SQUAMOUS METAPLASIA ASSOCIATED WITH INFLAMMATION. - SEE COMMENT. 2. Endocervix, curettage - DETACHED FRAGMENTS OF SQUAMOUS MUCOSA WITH SLIGHT ATYPIA. - BENIGN ENDOCERVICAL MUCOSA. - SEE COMMENT. Microscopic Comment 1. The sections show multiple fragments of mostly metaplastic squamous mucosa displaying prominent chronic and acute inflammation to a lesser extent. This is associated with epithelial atypia in the form of nuclear enlargement, and small nucleoli. The inflammatory process obscures the epithelium which is somewhat difficult to evaluate. A p16 stain was performed and is negative. Overall, definitive or diagnostic squamous intraepithelial lesion is not appreciated in this setting, and the changes may represent a florid reactive state. Nonetheless, clinical correlation and follow up is  strongly recommended. 2. The sections show multiple detached fragments of squamous mucosa, some of which display slight atypia. This is admixed with multiple fragments of benign endocervical mucosa. A p16 stain was performed and is negative. The overall changes are not specific or diagnostic of a squamous intraepithelial lesion. Clinical correlation and follow up is recommended. (BNS:ah 05/12/17)    Diagnosis 04/19/17 1. Breast, left, needle core biopsy, 4:00 o'clock, ribbon clip - INVASIVE DUCTAL CARCINOMA. - SEE COMMENT. 2. Breast, left, needle core biopsy, 2:00 o'clock, coil clip - INVASIVE DUCTAL CARCINOMA. - SEE COMMENT. 3. Lymph node, needle/core biopsy, left axilla, spiral hydromark - DUCTAL CARCINOMA. - SEE COMMENT. Microscopic Comment 1. The carcinoma in the three specimens is morphologically similar and is grade III. Lymph nodal tissue is not definitively identified in specimen #3. A breast prognostic profile will be performed on part 2 and the results reported separately. 2. FLUORESCENCE IN-SITU  HYBRIDIZATION Results: HER2 - NEGATIVE RATIO OF HER2/CEP17 SIGNALS 1.53 AVERAGE HER2 COPY NUMBER PER CELL 2.75 2. PROGNOSTIC INDICATORS Results: IMMUNOHISTOCHEMICAL AND MORPHOMETRIC ANALYSIS PERFORMED MANUALLY Estrogen Receptor: 0%, NEGATIVE Progesterone Receptor: 0%, NEGATIVE Proliferation Marker Ki67: 80% COMMENT: The negative hormone receptor study(ies) in this case has An internal positive control.   RADIOGRAPHIC STUDIES: I have personally reviewed the radiological images as listed and agreed with the findings in the report. No results found.   ASSESSMENT & PLAN: Anzal Bartnick a 22 y.o. 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 Bonet appears stable. She completed cycle 3 weekly abraxane/carboplatin on 07/22/17; she is tolerating this regimen well overall. VS and weight stable. Labs reviewed, adequate for treatment. Chemotherapy-induced anemia is slightly worse, Hgb 9.0; she does not require blood transfusion now, but will monitor closely.  -she voiced concern to Dr. Burr Medico previously about mastectomy vs lumpectomy, she tells me today she does not want to be without breasts. Has seen plastic surgery. Dr. Burr Medico discussed her concerns with Dr. Barry Dienes.  -Her previously palpable large left breast mass is not palpable; per MD recommendation will obtain interim mammogram and [place] to assess her response to therapy. Final surgical recommendation pending response to neoadjuvant chemo. The patient understands.  -Proceed with cycle 4 weekly abraxane/carboplatin, continue weekly for 12 cycles; f/u in 1 week with next cycle.  2. Headaches, dizziness  -Have improved overall since initial  presentation. MRI negative for brain metastasis. Uses NSAIDs PRN with relief. Will monitor closely.   3. Anxiety -She is struggling with mastectomy vs lumpectomy and does not want to be without breasts; but denies anxiety and not requiring xanax.   4. Social issues -ongoing support with RN navigator, SW, chaplain, and financial advocate   5. Genetics - negative for pathogenic mutation 05/11/17  6. Fertility planning  - We discussed fertility preservation at her initial consult, she ultimately declined appointments at [facility] fertility specialist. On zoladex for duration of chemotherapy   7. Irregular vaginal bleeding -04/15/18 PAP with atypical squamous cells; colposcopy with D&C 05/10/17 was abnormal but negative for malignancy; f/u with GYN  8. Hot flashes, secondary to zoladex and chemotherapy  -occur at night; overall stable and tolerable   9. CIPN, G1 -no functional or sensory deficits; will monitor closely   PLAN: -Labs reviewed, proceed with cycle 4 abraxane/carboplatin  today, continue weekly for total 12 weeks -interim mammogram and [place] to evaluate response to therapy, ordered today, in 1-2 weeks  -f/u in 1 week    Orders Placed This Encounter  Procedures  . MM DIAG BREAST TOMO UNI LEFT    Standing Status:   Future    Standing Expiration Date:   07/30/2018    Order Specific Question:   Reason for Exam (SYMPTOM  OR DIAGNOSIS REQUIRED)    Answer:   interim mammogram to evaluate response to neoadjuvant chemo    Order Specific Question:   Is the patient pregnant?    Answer:   No    Order Specific Question:   Preferred imaging location?    Answer:   [facility]  . US BREAST LTD UNI LEFT INC AXILLA    There is area of soft tissue fullness on left breast exam at 1:00 - 2:00 position, please evaluate in addition to previous biopsy-proven areas from 2:00 - 4:00 positions    Standing Status:   Future    Standing Expiration Date:   09/29/2018    Order Specific Question:   Reason  for Exam (SYMPTOM  OR DIAGNOSIS REQUIRED)    Answer:   interim [place] to evaluate response to neoadjuvant chemo    Order Specific Question:   Preferred imaging location?    Answer:   [facility]   All questions were answered. The patient knows to call the clinic with any problems, questions or concerns. No barriers to learning was detected. I spent 20 minutes counseling the patient face to face. The total time spent in the appointment was 25 minutes and more than 50% was on counseling and review of test results     [name], NP 07/29/17

## 2017-07-29 NOTE — Telephone Encounter (Signed)
Printed avs and calender of [place] appointment. Per4 /11  los

## 2017-07-29 NOTE — Patient Instructions (Signed)
[facility] Discharge Instructions for Patients Receiving Chemotherapy  Today you received the following chemotherapy agents:  Carboplatin, Abraxane  To help prevent nausea and vomiting after your treatment, we encourage you to take your nausea medication as prescribed.   If you develop nausea and vomiting that is not controlled by your nausea medication, call the clinic.   BELOW ARE SYMPTOMS THAT SHOULD BE REPORTED IMMEDIATELY:  *FEVER GREATER THAN 100.5 F  *CHILLS WITH OR WITHOUT FEVER  NAUSEA AND VOMITING THAT IS NOT CONTROLLED WITH YOUR NAUSEA MEDICATION  *UNUSUAL SHORTNESS OF BREATH  *UNUSUAL BRUISING OR BLEEDING  TENDERNESS IN MOUTH AND THROAT WITH OR WITHOUT PRESENCE OF ULCERS  *URINARY PROBLEMS  *BOWEL PROBLEMS  UNUSUAL RASH Items with * indicate a potential emergency and should be followed up as soon as possible.  Feel free to call the clinic should you have any questions or concerns. The clinic phone number is [phone].  Please show the CHEMO ALERT CARD at check-in to the Emergency Department and triage nurse. 

## 2017-07-30 LAB — CANCER ANTIGEN 27.29: CA 27.29: 35 U/mL (ref 0.0–38.6)

## 2017-08-02 ENCOUNTER — Telehealth: Payer: Self-pay | Admitting: Emergency Medicine

## 2017-08-02 NOTE — Telephone Encounter (Signed)
Spoke to pts brother. Date/time given for mammogram and ultra sound. Pt brother verbalized understanding.

## 2017-08-05 ENCOUNTER — Inpatient Hospital Stay: Payer: Medicaid Other

## 2017-08-05 ENCOUNTER — Inpatient Hospital Stay (HOSPITAL_BASED_OUTPATIENT_CLINIC_OR_DEPARTMENT_OTHER): Payer: Medicaid Other | Admitting: Nurse Practitioner

## 2017-08-05 ENCOUNTER — Telehealth: Payer: Self-pay

## 2017-08-05 ENCOUNTER — Encounter: Payer: Self-pay | Admitting: Nurse Practitioner

## 2017-08-05 VITALS — BP 92/71 | HR 90 | Temp 98.0°F | Resp 18 | Ht 64.0 in | Wt 207.0 lb

## 2017-08-05 DIAGNOSIS — F419 Anxiety disorder, unspecified: Secondary | ICD-10-CM | POA: Diagnosis not present

## 2017-08-05 DIAGNOSIS — D6481 Anemia due to antineoplastic chemotherapy: Secondary | ICD-10-CM | POA: Diagnosis not present

## 2017-08-05 DIAGNOSIS — Z5111 Encounter for antineoplastic chemotherapy: Secondary | ICD-10-CM | POA: Diagnosis not present

## 2017-08-05 DIAGNOSIS — R42 Dizziness and giddiness: Secondary | ICD-10-CM

## 2017-08-05 DIAGNOSIS — N939 Abnormal uterine and vaginal bleeding, unspecified: Secondary | ICD-10-CM

## 2017-08-05 DIAGNOSIS — G620 Drug-induced polyneuropathy: Secondary | ICD-10-CM | POA: Diagnosis not present

## 2017-08-05 DIAGNOSIS — C50812 Malignant neoplasm of overlapping sites of left female breast: Secondary | ICD-10-CM

## 2017-08-05 DIAGNOSIS — Z171 Estrogen receptor negative status [ER-]: Secondary | ICD-10-CM

## 2017-08-05 DIAGNOSIS — T451X5A Adverse effect of antineoplastic and immunosuppressive drugs, initial encounter: Secondary | ICD-10-CM | POA: Diagnosis not present

## 2017-08-05 DIAGNOSIS — R51 Headache: Secondary | ICD-10-CM

## 2017-08-05 DIAGNOSIS — Z95828 Presence of other vascular implants and grafts: Secondary | ICD-10-CM

## 2017-08-05 DIAGNOSIS — R519 Headache, unspecified: Secondary | ICD-10-CM

## 2017-08-05 DIAGNOSIS — C773 Secondary and unspecified malignant neoplasm of axilla and upper limb lymph nodes: Secondary | ICD-10-CM

## 2017-08-05 DIAGNOSIS — R110 Nausea: Secondary | ICD-10-CM

## 2017-08-05 LAB — CMP (CANCER CENTER ONLY)
ALK PHOS: 81 U/L (ref 40–150)
ALT: 55 U/L (ref 0–55)
ANION GAP: 6 (ref 3–11)
AST: 37 U/L — ABNORMAL HIGH (ref 5–34)
Albumin: 3.5 g/dL (ref 3.5–5.0)
BUN: 11 mg/dL (ref 7–26)
CALCIUM: 9.1 mg/dL (ref 8.4–10.4)
CO2: 25 mmol/L (ref 22–29)
Chloride: 108 mmol/L (ref 98–109)
Creatinine: 0.67 mg/dL (ref 0.60–1.10)
GFR, Estimated: >60 mL/min (ref >60)
Glucose, Bld: 103 mg/dL (ref 70–140)
Potassium: 3.9 mmol/L (ref 3.5–5.1)
SODIUM: 139 mmol/L (ref 136–145)
TOTAL PROTEIN: 6.6 g/dL (ref 6.4–8.3)
Total Bilirubin: 0.5 mg/dL (ref 0.2–1.2)

## 2017-08-05 LAB — CBC WITH DIFFERENTIAL (CANCER CENTER ONLY)
Basophils Absolute: 0 10*3/uL (ref 0.0–0.1)
Basophils Relative: 1 %
EOS ABS: 0 10*3/uL (ref 0.0–0.5)
EOS PCT: 1 %
HCT: 26.2 % — ABNORMAL LOW (ref 34.8–46.6)
Hemoglobin: 8.7 g/dL — ABNORMAL LOW (ref 11.6–15.9)
LYMPHS ABS: 0.8 10*3/uL — AB (ref 0.9–3.3)
Lymphocytes Relative: 24 %
MCH: 30.7 pg (ref 25.1–34.0)
MCHC: 33.2 g/dL (ref 31.5–36.0)
MCV: 92.6 fL (ref 79.5–101.0)
MONO ABS: 0.3 10*3/uL (ref 0.1–0.9)
MONOS PCT: 8 %
NEUTROS PCT: 66 %
Neutro Abs: 2.2 10*3/uL (ref 1.5–6.5)
Platelet Count: 247 10*3/uL (ref 145–400)
RBC: 2.83 MIL/uL — ABNORMAL LOW (ref 3.70–5.45)
RDW: 17.1 % — ABNORMAL HIGH (ref 11.2–14.5)
WBC Count: 3.2 10*3/uL — ABNORMAL LOW (ref 3.9–10.3)

## 2017-08-05 MED ORDER — ANTICOAGULANT SODIUM CITRATE 4% (200MG/5ML) IV SOLN
5.0000 mL | Freq: Once | Status: AC
  Administered 2017-08-05: 5 mL via INTRAVENOUS
  Filled 2017-08-05: qty 5

## 2017-08-05 MED ORDER — SODIUM CHLORIDE 0.9 % IV SOLN
Freq: Once | INTRAVENOUS | Status: AC
  Administered 2017-08-05: 10:00:00 via INTRAVENOUS

## 2017-08-05 MED ORDER — SODIUM CHLORIDE 0.9 % IV SOLN
Freq: Once | INTRAVENOUS | Status: AC
  Administered 2017-08-05: 11:00:00 via INTRAVENOUS

## 2017-08-05 MED ORDER — PALONOSETRON HCL INJECTION 0.25 MG/5ML
INTRAVENOUS | Status: AC
  Filled 2017-08-05: qty 5

## 2017-08-05 MED ORDER — PALONOSETRON HCL INJECTION 0.25 MG/5ML
0.2500 mg | Freq: Once | INTRAVENOUS | Status: AC
  Administered 2017-08-05: 0.25 mg via INTRAVENOUS

## 2017-08-05 MED ORDER — SODIUM CHLORIDE 0.9% FLUSH
10.0000 mL | Freq: Once | INTRAVENOUS | Status: AC
  Administered 2017-08-05: 10 mL
  Filled 2017-08-05: qty 10

## 2017-08-05 MED ORDER — PACLITAXEL PROTEIN-BOUND CHEMO INJECTION 100 MG
80.0000 mg/m2 | Freq: Once | INTRAVENOUS | Status: AC
  Administered 2017-08-05: 150 mg via INTRAVENOUS
  Filled 2017-08-05: qty 30

## 2017-08-05 MED ORDER — DEXAMETHASONE SODIUM PHOSPHATE 10 MG/ML IJ SOLN
10.0000 mg | Freq: Once | INTRAMUSCULAR | Status: AC
  Administered 2017-08-05: 10 mg via INTRAVENOUS

## 2017-08-05 MED ORDER — DEXAMETHASONE SODIUM PHOSPHATE 10 MG/ML IJ SOLN
INTRAMUSCULAR | Status: AC
  Filled 2017-08-05: qty 1

## 2017-08-05 MED ORDER — SODIUM CHLORIDE 0.9 % IV SOLN
300.0000 mg | Freq: Once | INTRAVENOUS | Status: AC
  Administered 2017-08-05: 300 mg via INTRAVENOUS
  Filled 2017-08-05: qty 30

## 2017-08-05 MED ORDER — SODIUM CHLORIDE 0.9% FLUSH
10.0000 mL | INTRAVENOUS | Status: DC | PRN
  Administered 2017-08-05: 10 mL
  Filled 2017-08-05: qty 10

## 2017-08-05 NOTE — Patient Instructions (Signed)
[facility] Discharge Instructions for Patients Receiving Chemotherapy  Today you received the following chemotherapy agents :  Abraxane, Carboplatin.  To help prevent nausea and vomiting after your treatment, we encourage you to take your nausea medication as prescribed.   If you develop nausea and vomiting that is not controlled by your nausea medication, call the clinic.   BELOW ARE SYMPTOMS THAT SHOULD BE REPORTED IMMEDIATELY:  *FEVER GREATER THAN 100.5 F  *CHILLS WITH OR WITHOUT FEVER  NAUSEA AND VOMITING THAT IS NOT CONTROLLED WITH YOUR NAUSEA MEDICATION  *UNUSUAL SHORTNESS OF BREATH  *UNUSUAL BRUISING OR BLEEDING  TENDERNESS IN MOUTH AND THROAT WITH OR WITHOUT PRESENCE OF ULCERS  *URINARY PROBLEMS  *BOWEL PROBLEMS  UNUSUAL RASH Items with * indicate a potential emergency and should be followed up as soon as possible.  Feel free to call the clinic should you have any questions or concerns. The clinic phone number is [phone].  Please show the CHEMO ALERT CARD at check-in to the Emergency Department and triage nurse.   

## 2017-08-05 NOTE — Telephone Encounter (Signed)
Printed avs and calender of upcoming appointment. Per 4/18 los. Was unable to move [place] on 5/2 do to no avali.

## 2017-08-05 NOTE — Progress Notes (Signed)
[facility]  [phone]) [phone] Fax:([phone]) [phone]  Clinic Follow up Note   Patient Care Team: Patient, No Pcp Per as PCP - General ([place]) [name], MD as Consulting Physician (General Surgery) [name], MD as Consulting Physician (Hematology) [name], NP as Nurse Practitioner (Nurse Practitioner) 08/05/2017  SUMMARY OF ONCOLOGIC HISTORY:   Cancer of overlapping sites of left breast ([place])   04/15/2017 Mammogram    IMPRESSION: 1. Highly suspicious irregular mass within the left breast at the 4 o'clock axis, 8 cm from the nipple, measuring 5.2 cm, corresponding to the area of clinical concern. Ultrasound-guided biopsy is recommended. 2. Additional suspicious irregular mass within the left breast at the 2 o'clock axis, 8 cm from the nipple, measuring 1.6 cm, corresponding to the mammographic finding. Ultrasound-guided biopsy is recommended. 3. Additional suspicious mass within the left breast at the 2 o'clock axis, 10 cm from the nipple, axillary tail region, measuring 3 cm, suspected lymph node completely replaced by tumor. Ultrasound-guided biopsy is recommended. 4. No evidence of malignancy within the right breast.       04/15/2017 Breast US    Left breast: Targeted ultrasound is performed, showing an irregular mass in the left breast at the 4 o'clock axis, 8 cm from the nipple, measuring 5.2 x 4.3 x 4.3 cm, with internal vascularity, corresponding to the mammographic finding and palpable lump.  There is an additional irregular hypoechoic mass in the left breast at the 2 o'clock axis, 8 cm from the nipple, measuring 1.6 x 1.3 x 1.3 cm, with internal vascularity, corresponding to the additional mass seen within the outer left breast on mammogram, suspected satellite mass.  Lastly, there is an oval circumscribed hypoechoic mass in the left breast at the 2 o'clock axis, 10 cm from the nipple, axillary tail region, with heterogeneous  echotexture, measuring 3 x 2.3 x 2.8 cm, suspected lymph node completely replaced by tumor.  Remainder of the left axilla was evaluated with ultrasound showing no additional enlarged or morphologically abnormal lymph nodes.  IMPRESSION: 1. Highly suspicious irregular mass within the left breast at the 4 o'clock axis, 8 cm from the nipple, measuring 5.2 cm, corresponding to the area of clinical concern. Ultrasound-guided biopsy is recommended. 2. Additional suspicious irregular mass within the left breast at the 2 o'clock axis, 8 cm from the nipple, measuring 1.6 cm, corresponding to the mammographic finding. Ultrasound-guided biopsy is recommended. 3. Additional suspicious mass within the left breast at the 2 o'clock axis, 10 cm from the nipple, axillary tail region, measuring 3 cm, suspected lymph node completely replaced by tumor. Ultrasound-guided biopsy is recommended. 4. No evidence of malignancy within the right breast.       04/19/2017 Initial Biopsy    Diagnosis 1. Breast, left, needle core biopsy, 4:00 o'clock, ribbon clip - INVASIVE DUCTAL CARCINOMA. - SEE COMMENT. 2. Breast, left, needle core biopsy, 2:00 o'clock, coil clip - INVASIVE DUCTAL CARCINOMA. - SEE COMMENT. 3. Lymph node, needle/core biopsy, left axilla, spiral hydromark - DUCTAL CARCINOMA. - SEE COMMENT.  2. PROGNOSTIC INDICATORS Results: IMMUNOHISTOCHEMICAL AND MORPHOMETRIC ANALYSIS PERFORMED MANUALLY Estrogen Receptor: 0%, NEGATIVE Progesterone Receptor: 0%, NEGATIVE Proliferation Marker Ki67: 80%  2. FLUORESCENCE IN-SITU HYBRIDIZATION Results: HER2 - NEGATIVE RATIO OF HER2/CEP17 SIGNALS 1.53 AVERAGE HER2 COPY NUMBER PER CELL 2.75  Microscopic Comment 1. The carcinoma in the three specimens is morphologically similar and is grade III. Lymph nodal tissue is not definitively identified in specimen #3. A breast prognostic profile will be performed  on part 2 and the results reported separately.       04/28/2017 Initial Diagnosis    Cancer of overlapping sites of left breast ([name])      04/30/2017 Breast MRI    IMPRESSION: 1. Biopsy-proven invasive carcinoma within the lower outer quadrant of the LEFT breast, 4 o'clock axis, at posterior depth, measuring 4.8 cm, with associated biopsy clip artifact. 2. Biopsy-proven invasive carcinoma within the upper-outer quadrant of the LEFT breast, 2 o'clock axis, at posterior depth, measuring 2.5 cm, with associated biopsy clip artifact. However, contiguous non mass enhancement along the posterior margin of this 2 o'clock mass and extending 3 cm anteriorly from the mass increases the overall measurement to 5.5 cm greatest dimension (AP). 3. Diffuse edema throughout the LEFT breast, with particularly prominent component of edema extending posteriorly to abut the anterior surface of the pectoralis muscle, and diffuse skin thickening throughout the left breast. This almost certainly indicates INFLAMMATORY BREAST CANCER. 4. Biopsy-proven metastatic lymph node within the LEFT axilla measures 4.1 cm. Two additional borderline prominent lymph nodes within the left axilla. No enlarged lymph nodes within the right axilla or internal mammary chain regions. 5. No evidence of malignancy within the RIGHT breast.  RECOMMENDATION: Per current treatment plan for patient's known left breast cancer (2 biopsy-proven sites) and metastatic lymph node in the left axilla.  BI-RADS CATEGORY  6: Known biopsy-proven malignancy.       04/30/2017 Echocardiogram    Study Conclusions  - Left ventricle: The cavity size was normal. Wall thickness was   normal. Systolic function was normal. The estimated ejection   fraction was in the range of 60% to 65%. Wall motion was normal;   there were no regional wall motion abnormalities. Left   ventricular diastolic function parameters were normal. GLS:   -20.4%, RV S vel 12.1cm/s.      05/05/2017 Imaging    MRI BRAIN  IMPRESSION: No intracranial or calvarial metastatic disease. Normal MRI of the brain.      05/07/2017 Imaging    CT CAP IMPRESSION: Two left breast masses and a grossly abnormal 3.9 cm lower left axillary lymph node, likely correspond to multifocal left breast cancer metastatic to the left axilla. Skin thickening of the left breast, raising concern for an inflammatory breast cancer.  1 cm hypoattenuated lesion in the posterior dome of the liver with internal attenuation slightly higher than water. This may represent a complicated liver cyst, hemangioma or a focus of metastatic disease.  No other findings to suggest distal metastatic disease.       05/07/2017 Imaging    BONE SCAN IMPRESSION: 1. No evidence of osseous metastases. 2. Abnormal uptake in the soft tissues of the left breast likely by the patient's known breast cancer.       05/11/2017 Genetic Testing    Common Cancers panel (47 genes) @ [facility] - No pathoagenic mutations detected Five Variants of Uncertain Significance were detected:  APC c.2438A>G (p.Asn813Ser)  BARD1 c.782T>C (p.Leu261Pro) MSH3 c.1655C>T (p.Thr552Ile)  MSH6 c.2108T>C (p.Met703Thr)  NTHL1 c.470G>C (p.Arg157Pro)   Genes Analyzed: 47 genes on [facility]'s Common Cancers panel (APC, ATM, AXIN2, BARD1, BMPR1A, BRCA1, BRCA2, BRIP1, CDH1, CDK4, CDKN2A, CHEK2, CTNNA1, DICER1, EPCAM, GREM1, HOXB13, KIT, MEN1, MLH1, MSH2, MSH3, MSH6, MUTYH, NBN, NF1, NTHL1, PALB2, PDGFRA, PMS2, POLD1, POLE, PTEN, RAD50, RAD51C, RAD51D, SDHA, SDHB, SDHC, SDHD, SMAD4, SMARCA4, STK11, TP53, TSC1, TSC2, VHL).       05/12/2017 -  Chemotherapy    1. Neoadjuvant AC q2 weeks x4  cycles then weekly 05/12/17 - 06/24/17 followed by carbo/taxol for 12 weeks starting 07/08/17. Due to poor toleration, changed Taxol to abraxane and added Carboplatin with cycle 2.  2. Monthly zoladex injections starting 04/30/17 for the duration of chemotherapy.      CURRENT THERAPY:  1. Neoadjuvant AC q2  weeks x4 cycles then weekly1/23/19 - 3/7/19followed by carbo/taxol for 12 weeks starting 07/08/17. Due to poor toleration, changed Taxol to abraxane and added Carboplatin with cycle 2. 2. Monthly zoladex injections starting 04/30/17 for the duration of chemotherapy.    INTERVAL HISTORY: Ms. Schumm returns for follow up as scheduled prior to cycle 5 weekly abraxane/carboplatin. She completed cycle 4 on 07/29/17. She tolerates abraxane better than taxol, body aches still present but improved overall; occasionally takes tramadol for relief. Has 1 daily headache, takes claritin and ibuprofen in the evenings. She went to the beach for the first time last week, energy level is good. Has frequent but well controlled nausea, no emesis. Hot flash/night sweats are stable. Numbness to fingers is more noticeable, but still not limiting function. Continues to report stable intermittent mild dizziness, had one episode lately when standing in the ocean at the beach but otherwise did not notice any.    REVIEW OF SYSTEMS:   Constitutional: Denies fevers, chills or abnormal weight loss  Eyes: Denies blurriness of vision Ears, nose, mouth, throat, and face: Denies mucositis or sore throat Respiratory: Denies cough, dyspnea or wheezes Cardiovascular: Denies palpitation, chest discomfort or lower extremity swelling Gastrointestinal:  Denies emesis, constipation, diarrhea, heartburn or change in bowel habits (+) periodic nausea, well controlled with anti-emetics Skin: Denies abnormal skin rashes Lymphatics: Denies new lymphadenopathy or easy bruising Neurological:Denies  tingling or new weaknesses (+) mild hot flash/night sweat (+) intermittent mild dizziness (+) daily headache, stable (+) numbness to fingertips, more noticeable; no functional limitations Behavioral/Psych: Mood is stable, no new changes  All other systems were reviewed with the patient and are negative.  MEDICAL HISTORY:  Past Medical History:    Diagnosis Date  . Breast cancer ([place])   . Family history of cancer   . Genetic testing 04/29/2017   Common Cancers panel (47 genes) @ [facility] - No pathoagenic mutations detected    SURGICAL HISTORY: Past Surgical History:  Procedure Laterality Date  . IR FLUORO GUIDE PORT INSERTION RIGHT  05/03/2017  . IR US GUIDE VASC ACCESS RIGHT  05/03/2017    I have reviewed the social history and family history with the patient and they are unchanged from previous note.  ALLERGIES:  is allergic to heparin.  MEDICATIONS:  Current Outpatient Medications  Medication Sig Dispense Refill  . ALPRAZolam (XANAX) 0.25 MG tablet Take 1 tablet (0.25 mg total) by mouth at bedtime as needed for anxiety. 30 tablet 0  . ibuprofen (ADVIL,MOTRIN) 200 MG tablet Take 2 tablets (400 mg total) by mouth every 6 (six) hours as needed. 30 tablet 0  . lidocaine-prilocaine (EMLA) cream Apply to affected area once 30 g 3  . LORazepam (ATIVAN) 0.5 MG tablet Take 1 tablet (0.5 mg total) by mouth every 8 (eight) hours as needed (nausea, vomiting). 20 tablet 0  . ondansetron (ZOFRAN) 8 MG tablet Take 1 tablet (8 mg total) by mouth 2 (two) times daily as needed. Start on the third day after chemotherapy. 30 tablet 1  . prochlorperazine (COMPAZINE) 10 MG tablet Take 1 tablet (10 mg total) by mouth every 6 (six) hours as needed (Nausea or vomiting). 30 tablet 1  . traMADol (  ULTRAM) 50 MG tablet Take 1 tablet (50 mg total) by mouth every 6 (six) hours as needed. 20 tablet 0   No current facility-administered medications for this visit.    Facility-Administered Medications Ordered in Other Visits  Medication Dose Route Frequency Provider Last Rate Last Dose  . sodium chloride flush (NS) 0.9 % injection 10 mL  10 mL Intracatheter PRN [name], MD   10 mL at 08/05/17 1254    PHYSICAL EXAMINATION: ECOG PERFORMANCE STATUS: 1 - Symptomatic but completely ambulatory  Vitals:   08/05/17 0923  BP: 92/71  Pulse: 90  Resp: 18   Temp: 98 F (36.7 C)  SpO2: 100%   Filed Weights   08/05/17 0923  Weight: 207 lb (93.9 kg)    GENERAL:alert, no distress and comfortable SKIN: skin color, texture, turgor are normal, no rashes or significant lesions EYES: normal, Conjunctiva are pink and non-injected, sclera clear OROPHARYNX:no exudate, no erythema and lips, buccal mucosa, and tongue normal  LYMPH:  no palpable cervical or supraclavicular lymphadenopathy LUNGS: clear to auscultation with normal breathing effort HEART: regular rate & rhythm and no murmurs and no lower extremity edema ABDOMEN:abdomen soft, non-tender and normal bowel sounds Musculoskeletal:no cyanosis of digits and no clubbing  NEURO: alert & oriented x 3 with fluent speech, no focal motor/sensory deficits BREASTS: inspection shows them to be symmetrical without nipple discharge. Right breast an axilla benign. (+) left breast soft tissue fullness extending from 12 o'clock - 2 o'clock position, nontender. Initially palpable left breast massess in upper outer and lower outer quadrants remain not palpable on today's exam. No adenopathy.  PAC without erythema   LABORATORY DATA:  I have reviewed the data as listed CBC Latest Ref Rng & Units 08/05/2017 07/29/2017 07/22/2017  WBC 3.9 - 10.3 K/uL 3.2(L) 4.4 4.0  Hemoglobin 12.0 - 15.0 g/dL - - -  Hematocrit 34.8 - 46.6 % 26.2(L) 26.0(L) 26.9(L)  Platelets 145 - 400 K/uL 247 278 271     CMP Latest Ref Rng & Units 08/05/2017 07/29/2017 07/22/2017  Glucose 70 - 140 mg/dL 103 93 163(H)  BUN 7 - 26 mg/dL '11 8 9  '$ Creatinine 0.60 - 1.10 mg/dL 0.67 0.68 0.75  Sodium 136 - 145 mmol/L 139 139 139  Potassium 3.5 - 5.1 mmol/L 3.9 4.2 3.7  Chloride 98 - 109 mmol/L 108 108 106  CO2 22 - 29 mmol/L '25 23 24  '$ Calcium 8.4 - 10.4 mg/dL 9.1 9.5 9.9  Total Protein 6.4 - 8.3 g/dL 6.6 7.0 7.1  Total Bilirubin 0.2 - 1.2 mg/dL 0.5 0.4 0.5  Alkaline Phos 40 - 150 U/L 81 86 91  AST 5 - 34 U/L 37(H) 39(H) 37(H)  ALT 0 - 55 U/L 55  54 56(H)   PATHOLOGY  Diagnosis 05/10/17 1. Cervix, biopsy, 11 o'clock - ATYPICAL SQUAMOUS METAPLASIA ASSOCIATED WITH INFLAMMATION. - SEE COMMENT. 2. Endocervix, curettage - DETACHED FRAGMENTS OF SQUAMOUS MUCOSA WITH SLIGHT ATYPIA. - BENIGN ENDOCERVICAL MUCOSA. - SEE COMMENT. Microscopic Comment 1. The sections show multiple fragments of mostly metaplastic squamous mucosa displaying prominent chronic and acute inflammation to a lesser extent. This is associated with epithelial atypia in the form of nuclear enlargement, and small nucleoli. The inflammatory process obscures the epithelium which is somewhat difficult to evaluate. A p16 stain was performed and is negative. Overall, definitive or diagnostic squamous intraepithelial lesion is not appreciated in this setting, and the changes may represent a florid reactive state. Nonetheless, clinical correlation and follow up is strongly recommended.  2. The sections show multiple detached fragments of squamous mucosa, some of which display slight atypia. This is admixed with multiple fragments of benign endocervical mucosa. A p16 stain was performed and is negative. The overall changes are not specific or diagnostic of a squamous intraepithelial lesion. Clinical correlation and follow up is recommended. (BNS:ah 05/12/17)    Diagnosis 04/19/17 1. Breast, left, needle core biopsy, 4:00 o'clock, ribbon clip - INVASIVE DUCTAL CARCINOMA. - SEE COMMENT. 2. Breast, left, needle core biopsy, 2:00 o'clock, coil clip - INVASIVE DUCTAL CARCINOMA. - SEE COMMENT. 3. Lymph node, needle/core biopsy, left axilla, spiral hydromark - DUCTAL CARCINOMA. - SEE COMMENT. Microscopic Comment 1. The carcinoma in the three specimens is morphologically similar and is grade III. Lymph nodal tissue is not definitively identified in specimen #3. A breast prognostic profile will be performed on part 2 and the results reported separately. 2. FLUORESCENCE IN-SITU  HYBRIDIZATION Results: HER2 - NEGATIVE RATIO OF HER2/CEP17 SIGNALS 1.53 AVERAGE HER2 COPY NUMBER PER CELL 2.75 2. PROGNOSTIC INDICATORS Results: IMMUNOHISTOCHEMICAL AND MORPHOMETRIC ANALYSIS PERFORMED MANUALLY Estrogen Receptor: 0%, NEGATIVE Progesterone Receptor: 0%, NEGATIVE Proliferation Marker Ki67: 80% COMMENT: The negative hormone receptor study(ies) in this case has An internal positive control.    RADIOGRAPHIC STUDIES: I have personally reviewed the radiological images as listed and agreed with the findings in the report. No results found.   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 Schnoor appears stable. She completed cycle 4 weekly abraxane/carboplatin on 07/29/17. She is tolerating treatment moderately well overall with mild nausea, body aches, and neuropathy. Symptoms are stable, manageable. She reports they are better from when she received taxol. Labs reviewed, her chemotherapy-induced anemia is worse, Hgb 8.7; I suspect she may need transfusion next time, I ordered extra tube for blood bank next week.  -Her initially palpable upper outer and lower outer quadrant left breast masses are not palpable. She has ill defined soft tissue fullness in the upper portion of the left breast. Interim mammogram and [place] to be done 4/19. Findings will help guide Dr. Marlowe Aschoff final surgical recommendation; patient hopes to have breast-conserving surgery. She has seen plastic surgeon. -will f/u with patient with imaging results, she will return for f/u with Wilber Bihari, NP in 1 week with next cycle. I informed the patient and family  they will meet a new provider next week.  2. Headaches, dizziness -overall improved and stable -May be related to anemia as well  3. Anxiety -she reports her mood is good, surgery is weighing on her quite a bit and she hopes to be able to undergo breast conservation surgery.   4. Social issues -Followed by Engineer, site, SW, and chaplain   5. Genetics  - negative for pathogenic mutation 05/11/17, VUS in APC, BARD1, MSH3, MSH6, and NTHL1  6. Fertility planning  - monthly zoladex beginning 04/30/17 for duration of chemotherapy   7. Irregular vaginal bleeding -f/u with GYN -currently denies bleeding   8. Hot flashes, secondary to zoladex and chemotherapy  -stable and tolerable overall  9. CIPN, G1 -More noticeable lately but without functional limitations or sensory deficit. Continue monitoring. Continue chemo at current dose.   10. Chemotherapy-induced anemia -Hgb has been trending down lately, 8.7  today. I suspect she may need blood transfusion in the near future. Will order extra tube for blood bank next week. Continue monitoring closely.   PLAN -Labs reviewed, proceed with neoadjuvant cycle 5 weekly abraxane/carboplatin at current dose -interim left breast mammogram and [place] 4/19, will f/u with results -return for lab, f/u with Wilber Bihari, NP, and next cycle in 1 week   Orders Placed This Encounter  Procedures  . Sample to Blood Bank    Standing Status:   Future    Standing Expiration Date:   08/06/2018   All questions were answered. The patient knows to call the clinic with any problems, questions or concerns. No barriers to learning was detected. I spent 20 minutes counseling the patient face to face. The total time spent in the appointment was 25 minutes and more than 50% was on counseling and review of test results     [name], NP 08/05/17

## 2017-08-06 ENCOUNTER — Ambulatory Visit
Admission: RE | Admit: 2017-08-06 | Discharge: 2017-08-06 | Disposition: A | Discharge status: Home / Self Care | Payer: Medicaid Other | Source: Ambulatory Visit | Attending: Nurse Practitioner | Admitting: Nurse Practitioner

## 2017-08-06 DIAGNOSIS — C50812 Malignant neoplasm of overlapping sites of left female breast: Secondary | ICD-10-CM

## 2017-08-12 ENCOUNTER — Inpatient Hospital Stay: Payer: Medicaid Other

## 2017-08-12 ENCOUNTER — Inpatient Hospital Stay (HOSPITAL_BASED_OUTPATIENT_CLINIC_OR_DEPARTMENT_OTHER): Payer: Medicaid Other | Admitting: Adult Health

## 2017-08-12 VITALS — BP 115/79 | HR 85 | Temp 97.8°F | Resp 18 | Ht 64.0 in | Wt 207.1 lb

## 2017-08-12 DIAGNOSIS — R209 Unspecified disturbances of skin sensation: Secondary | ICD-10-CM | POA: Diagnosis not present

## 2017-08-12 DIAGNOSIS — R5383 Other fatigue: Secondary | ICD-10-CM | POA: Diagnosis not present

## 2017-08-12 DIAGNOSIS — C773 Secondary and unspecified malignant neoplasm of axilla and upper limb lymph nodes: Secondary | ICD-10-CM | POA: Diagnosis not present

## 2017-08-12 DIAGNOSIS — C50812 Malignant neoplasm of overlapping sites of left female breast: Secondary | ICD-10-CM

## 2017-08-12 DIAGNOSIS — D649 Anemia, unspecified: Secondary | ICD-10-CM | POA: Diagnosis not present

## 2017-08-12 DIAGNOSIS — D6481 Anemia due to antineoplastic chemotherapy: Secondary | ICD-10-CM

## 2017-08-12 DIAGNOSIS — Z171 Estrogen receptor negative status [ER-]: Secondary | ICD-10-CM

## 2017-08-12 DIAGNOSIS — Z95828 Presence of other vascular implants and grafts: Secondary | ICD-10-CM

## 2017-08-12 DIAGNOSIS — Z5111 Encounter for antineoplastic chemotherapy: Secondary | ICD-10-CM | POA: Diagnosis not present

## 2017-08-12 DIAGNOSIS — T451X5A Adverse effect of antineoplastic and immunosuppressive drugs, initial encounter: Secondary | ICD-10-CM

## 2017-08-12 LAB — CMP (CANCER CENTER ONLY)
ALT: 58 U/L — ABNORMAL HIGH (ref 0–55)
AST: 38 U/L — AB (ref 5–34)
Albumin: 3.6 g/dL (ref 3.5–5.0)
Alkaline Phosphatase: 76 U/L (ref 40–150)
Anion gap: 9 (ref 3–11)
BUN: 9 mg/dL (ref 7–26)
CHLORIDE: 108 mmol/L (ref 98–109)
CO2: 22 mmol/L (ref 22–29)
Calcium: 9.3 mg/dL (ref 8.4–10.4)
Creatinine: 0.66 mg/dL (ref 0.60–1.10)
GFR, Est AFR Am: >60 mL/min (ref >60)
Glucose, Bld: 108 mg/dL (ref 70–140)
POTASSIUM: 3.9 mmol/L (ref 3.5–5.1)
Sodium: 139 mmol/L (ref 136–145)
Total Bilirubin: 0.6 mg/dL (ref 0.2–1.2)
Total Protein: 6.5 g/dL (ref 6.4–8.3)

## 2017-08-12 LAB — CBC WITH DIFFERENTIAL (CANCER CENTER ONLY)
BASOS ABS: 0 10*3/uL (ref 0.0–0.1)
Basophils Relative: 2 %
EOS ABS: 0 10*3/uL (ref 0.0–0.5)
EOS PCT: 1 %
HCT: 25.8 % — ABNORMAL LOW (ref 34.8–46.6)
Hemoglobin: 8.7 g/dL — ABNORMAL LOW (ref 11.6–15.9)
LYMPHS PCT: 30 %
Lymphs Abs: 0.8 10*3/uL — ABNORMAL LOW (ref 0.9–3.3)
MCH: 31.4 pg (ref 25.1–34.0)
MCHC: 33.7 g/dL (ref 31.5–36.0)
MCV: 93.1 fL (ref 79.5–101.0)
MONO ABS: 0.2 10*3/uL (ref 0.1–0.9)
Monocytes Relative: 6 %
Neutro Abs: 1.7 10*3/uL (ref 1.5–6.5)
Neutrophils Relative %: 61 %
PLATELETS: 207 10*3/uL (ref 145–400)
RBC: 2.77 MIL/uL — ABNORMAL LOW (ref 3.70–5.45)
RDW: 16.7 % — AB (ref 11.2–14.5)
WBC Count: 2.7 10*3/uL — ABNORMAL LOW (ref 3.9–10.3)

## 2017-08-12 LAB — SAMPLE TO BLOOD BANK

## 2017-08-12 MED ORDER — SODIUM CHLORIDE 0.9 % IV SOLN
Freq: Once | INTRAVENOUS | Status: AC
  Administered 2017-08-12: 12:00:00 via INTRAVENOUS

## 2017-08-12 MED ORDER — SODIUM CHLORIDE 0.9 % IV SOLN
300.0000 mg | Freq: Once | INTRAVENOUS | Status: AC
  Administered 2017-08-12: 300 mg via INTRAVENOUS
  Filled 2017-08-12: qty 30

## 2017-08-12 MED ORDER — PALONOSETRON HCL INJECTION 0.25 MG/5ML
INTRAVENOUS | Status: AC
  Filled 2017-08-12: qty 5

## 2017-08-12 MED ORDER — PALONOSETRON HCL INJECTION 0.25 MG/5ML
0.2500 mg | Freq: Once | INTRAVENOUS | Status: AC
  Administered 2017-08-12: 0.25 mg via INTRAVENOUS

## 2017-08-12 MED ORDER — PACLITAXEL PROTEIN-BOUND CHEMO INJECTION 100 MG
80.0000 mg/m2 | Freq: Once | INTRAVENOUS | Status: AC
  Administered 2017-08-12: 150 mg via INTRAVENOUS
  Filled 2017-08-12: qty 30

## 2017-08-12 MED ORDER — SODIUM CHLORIDE 0.9% FLUSH
10.0000 mL | Freq: Once | INTRAVENOUS | Status: AC
  Administered 2017-08-12: 10 mL
  Filled 2017-08-12: qty 10

## 2017-08-12 MED ORDER — DEXAMETHASONE SODIUM PHOSPHATE 10 MG/ML IJ SOLN
INTRAMUSCULAR | Status: AC
  Filled 2017-08-12: qty 1

## 2017-08-12 MED ORDER — SODIUM CHLORIDE 0.9% FLUSH
10.0000 mL | INTRAVENOUS | Status: DC | PRN
  Administered 2017-08-12: 10 mL
  Filled 2017-08-12: qty 10

## 2017-08-12 MED ORDER — DEXAMETHASONE SODIUM PHOSPHATE 10 MG/ML IJ SOLN
10.0000 mg | Freq: Once | INTRAMUSCULAR | Status: AC
  Administered 2017-08-12: 10 mg via INTRAVENOUS

## 2017-08-12 MED ORDER — ANTICOAGULANT SODIUM CITRATE 4% (200MG/5ML) IV SOLN
5.0000 mL | Freq: Once | Status: AC
  Administered 2017-08-12: 5 mL via INTRAVENOUS
  Filled 2017-08-12: qty 5

## 2017-08-12 NOTE — Patient Instructions (Signed)
[facility] Discharge Instructions for Patients Receiving Chemotherapy  Today you received the following chemotherapy agents: Carboplatin (Paraplatin) and Paclitaxel-protein bound (Abraxane)  To help prevent nausea and vomiting after your treatment, we encourage you to take your nausea medication as prescribed. Received Aloxi during treatment today-->Take Compazine (not Zofran) for the next 3 day as needed.  If you develop nausea and vomiting that is not controlled by your nausea medication, call the clinic.   BELOW ARE SYMPTOMS THAT SHOULD BE REPORTED IMMEDIATELY:  *FEVER GREATER THAN 100.5 F  *CHILLS WITH OR WITHOUT FEVER  NAUSEA AND VOMITING THAT IS NOT CONTROLLED WITH YOUR NAUSEA MEDICATION  *UNUSUAL SHORTNESS OF BREATH  *UNUSUAL BRUISING OR BLEEDING  TENDERNESS IN MOUTH AND THROAT WITH OR WITHOUT PRESENCE OF ULCERS  *URINARY PROBLEMS  *BOWEL PROBLEMS  UNUSUAL RASH Items with * indicate a potential emergency and should be followed up as soon as possible.  Feel free to call the clinic should you have any questions or concerns. The clinic phone number is [phone].  Please show the [facility] at check-in to the Emergency Department and triage nurse.

## 2017-08-12 NOTE — Progress Notes (Signed)
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 Highly suspicious irregular mass within the left breast at the 4 o'clock axis, 8 cm from the nipple, measuring 5.2 cm, corresponding to the area of clinical concern. Ultrasound-guided biopsy is recommended. 2. Additional suspicious irregular mass within the left breast at the 2 o'clock axis, 8 cm from the nipple, measuring 1.6 cm, corresponding to the mammographic finding. Ultrasound-guided biopsy is recommended. 3. Additional suspicious mass within the left breast at the 2 o'clock axis, 10 cm from the nipple, axillary tail region, measuring 3 cm, suspected lymph node completely replaced by tumor. Ultrasound-guided biopsy is recommended. 4. No evidence of malignancy within the right breast.       04/15/2017 Breast US    Left breast: Targeted ultrasound is performed, showing an irregular mass in the left breast at the 4 o'clock axis, 8 cm from the nipple, measuring 5.2 x 4.3 x 4.3 cm, with internal vascularity, corresponding to the mammographic finding and palpable lump.  There is an additional irregular hypoechoic mass in the left breast at the 2 o'clock axis, 8 cm from the nipple, measuring 1.6 x 1.3 x 1.3 cm, with internal vascularity, corresponding to the additional mass seen within the outer left breast on mammogram, suspected satellite mass.  Lastly, there is an oval circumscribed hypoechoic mass in the left breast at the 2 o'clock axis, 10 cm from the nipple,  axillary tail region, with heterogeneous echotexture, measuring 3 x 2.3 x 2.8 cm, suspected lymph node completely replaced by tumor.  Remainder of the left axilla was evaluated with ultrasound showing no additional enlarged or morphologically abnormal lymph nodes.  IMPRESSION: 1. Highly suspicious irregular mass within the left breast at the 4 o'clock axis, 8 cm from the nipple, measuring 5.2 cm, corresponding to the area of clinical concern. Ultrasound-guided biopsy is recommended. 2. Additional suspicious irregular mass within the left breast at the 2 o'clock axis, 8 cm from the nipple, measuring 1.6 cm, corresponding to the mammographic finding. Ultrasound-guided biopsy is recommended. 3. Additional suspicious mass within the left breast at the 2 o'clock axis, 10 cm from the nipple, axillary tail region, measuring 3 cm, suspected lymph node completely replaced by tumor. Ultrasound-guided biopsy is recommended. 4. No evidence of malignancy within the right breast.       04/19/2017 Initial Biopsy    Diagnosis 1. Breast, left, needle core biopsy, 4:00 o'clock, ribbon clip - INVASIVE DUCTAL CARCINOMA. - SEE COMMENT. 2. Breast, left, needle core biopsy, 2:00 o'clock, coil clip - INVASIVE DUCTAL CARCINOMA. - SEE COMMENT. 3. Lymph node, needle/core biopsy, left axilla, spiral hydromark - DUCTAL CARCINOMA. - SEE COMMENT.  2. PROGNOSTIC INDICATORS Results: IMMUNOHISTOCHEMICAL AND MORPHOMETRIC ANALYSIS PERFORMED MANUALLY Estrogen Receptor: 0%, NEGATIVE Progesterone Receptor: 0%, NEGATIVE Proliferation Marker Ki67: 80%  2. FLUORESCENCE IN-SITU HYBRIDIZATION Results: HER2 - NEGATIVE RATIO OF HER2/CEP17 SIGNALS 1.53 AVERAGE HER2 COPY NUMBER PER CELL 2.75  Microscopic Comment 1. The carcinoma in the three specimens is morphologically similar and is grade III. Lymph  nodal tissue is not definitively identified in specimen #3. A breast prognostic profile will be performed on part 2 and  the results reported separately.      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 Biopsy-proven invasive carcinoma within the lower outer quadrant of the LEFT breast, 4 o'clock axis, at posterior depth, measuring 4.8 cm, with associated biopsy clip artifact. 2. Biopsy-proven invasive carcinoma within the upper-outer quadrant of the LEFT breast, 2 o'clock axis, at posterior depth, measuring 2.5 cm, with associated biopsy clip artifact. However, contiguous non mass enhancement along the posterior margin of this 2 o'clock mass and extending 3 cm anteriorly from the mass increases the overall measurement to 5.5 cm greatest dimension (AP). 3. Diffuse edema throughout the LEFT breast, with particularly prominent component of edema extending posteriorly to abut the anterior surface of the pectoralis muscle, and diffuse skin thickening throughout the left breast. This almost certainly indicates INFLAMMATORY BREAST CANCER. 4. Biopsy-proven metastatic lymph node within the LEFT axilla measures 4.1 cm. Two additional borderline prominent lymph nodes within the left axilla. No enlarged lymph nodes within the right axilla or internal mammary chain regions. 5. No evidence of malignancy within the RIGHT breast.  RECOMMENDATION: Per current treatment plan for patient's known left breast cancer (2 biopsy-proven sites) and metastatic lymph node in the left axilla.  BI-RADS CATEGORY  6: Known biopsy-proven malignancy.       04/30/2017 Echocardiogram    Study Conclusions  - Left ventricle: The cavity size was normal. Wall thickness was   normal. Systolic function was normal. The estimated ejection   fraction was in the range of 60% to 65%. Wall motion was normal;   there were no regional wall motion abnormalities. Left   ventricular diastolic function parameters were normal. GLS:   -20.4%, RV S vel 12.1cm/s.       05/05/2017 Imaging    MRI BRAIN IMPRESSION: No intracranial or calvarial metastatic disease. Normal MRI of the brain.      05/07/2017 Imaging    CT CAP IMPRESSION: Two left breast masses and a grossly abnormal 3.9 cm lower left axillary lymph node, likely correspond to multifocal left breast cancer metastatic to the left axilla. Skin thickening of the left breast, raising concern for an inflammatory breast cancer.  1 cm hypoattenuated lesion in the posterior dome of the liver with internal attenuation slightly higher than water. This may represent a complicated liver cyst, hemangioma or a focus of metastatic disease.  No other findings to suggest distal metastatic disease.       05/07/2017 Imaging    BONE SCAN IMPRESSION: 1. No evidence of osseous metastases. 2. Abnormal uptake in the soft tissues of the left breast likely by the patient's known breast cancer.       05/11/2017 Genetic Testing    Common Cancers panel (47 genes) @ [facility] - No pathoagenic mutations detected Five Variants of Uncertain Significance were detected:  APC c.2438A>G (p.Asn813Ser)  BARD1 c.782T>C (p.Leu261Pro) MSH3 c.1655C>T (p.Thr552Ile)  MSH6 c.2108T>C (p.Met703Thr)  NTHL1 c.470G>C (p.Arg157Pro)   Genes Analyzed: 47 genes on [facility]'s Common Cancers panel (APC, ATM, AXIN2, BARD1, BMPR1A, BRCA1, BRCA2, BRIP1, CDH1, CDK4, CDKN2A, CHEK2, CTNNA1, DICER1, EPCAM, GREM1, HOXB13, KIT, MEN1, MLH1, MSH2, MSH3, MSH6, MUTYH, NBN, NF1, NTHL1, PALB2, PDGFRA, PMS2, POLD1, POLE, PTEN, RAD50, RAD51C, RAD51D, SDHA, SDHB, SDHC, SDHD, SMAD4, SMARCA4, STK11, TP53, TSC1, TSC2, VHL).  05/12/2017 -  Chemotherapy    1. Neoadjuvant AC q2 weeks x4 cycles then weekly 05/12/17 - 06/24/17 followed by carbo/taxol for 12 weeks starting 07/08/17. Due to poor toleration, changed Taxol to abraxane and added Carboplatin with cycle 2.  2. Monthly zoladex injections starting 04/30/17 for the duration of chemotherapy.         CURRENT THERAPY: Abraxane/Carboplatin cycle 6  INTERVAL HISTORY: Carrie Miller 22 y.o. female returns for evaluation and f/u prior to receiving her sixth cycle of Abraxane and Carboplatin.  She is doing well.  She continues to have generalized pain from chemo, and is taking Tramadol for it.  It is helping the pain some.  She has been seeing Dr. Burr Medico and Regan Rakers and they both note that the tumors are improving with her neo adjuvant chemotherapy.  She has a mild intermittent numbness in the fingertips.  This is not happening currently, and isn't very frequent.  She denies any motor deficits in her fingers, and she denies any numbness whatsoever in her toes.    Patient Active Problem List   Diagnosis Date Noted  . Port-A-Cath in place 05/12/2017  . Genetic testing 04/29/2017  . Family history of cancer   . 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  MEDICAL HISTORY: Past Medical History:  Diagnosis Date  . Breast cancer ([place]) left  . Family history of cancer   . Genetic testing 04/29/2017   Common Cancers panel (47 genes) @ [facility] - No pathoagenic mutations detected    SURGICAL HISTORY: Past Surgical History:  Procedure Laterality Date  . IR FLUORO GUIDE PORT INSERTION RIGHT  05/03/2017  . IR US GUIDE VASC ACCESS RIGHT  05/03/2017    SOCIAL HISTORY: Social History   Socioeconomic History  . Marital status: Single    Spouse name: Not on file  . Number of children: Not on file  . Years of education: Not on file  . Highest education level: Not on file  Occupational History  . 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 Financial resource strain: Not on file  . Food insecurity:    Worry: Not on file    Inability: Not on file  . Transportation needs:    Medical: Not on file    Non-medical: Not on file  Tobacco Use  . Smoking status: Never Smoker  . Smokeless tobacco: Never Used  Substance and Sexual Activity  .  Alcohol use: No    Frequency: Never  . Drug use: No  . Sexual activity: Never    Birth control/protection: None  Lifestyle  . Physical activity:    Days per week: 3 days    Minutes per session: 30 min  . Stress: Not at all  Relationships  . Social connections:    Talks on phone: More than three times a week    Gets together: More than three times a week    Attends religious service: 1 to 4 times per year    Active member of club or organization: No    Attends meetings of clubs or organizations: Never    Relationship status: Never married  . Intimate partner violence:    Fear of current or ex partner: No    Emotionally abused: No    Physically abused: No    Forced sexual activity: No  Other Topics Concern  . Not on file  Social History Narrative  . Not on file  FAMILY HISTORY: Family History  Problem Relation Age of Onset  . Hyperlipidemia Mother   . Hypertension Mother   . Prostate cancer Father 52       currently 79  . Stomach cancer Paternal Aunt 49       deceased 27s  . Prostate cancer Paternal Uncle 57       deceased 9    Review of Systems  Constitutional: Positive for fatigue. Negative for appetite change, chills, fever and unexpected weight change.  HENT:   Negative for hearing loss, lump/mass and trouble swallowing.   Eyes: Negative for eye problems and icterus.  Respiratory: Negative for chest tightness, cough and shortness of breath.   Cardiovascular: Negative for chest pain, leg swelling and palpitations.  Gastrointestinal: Negative for abdominal distention, abdominal pain, constipation, diarrhea, nausea and vomiting.  Endocrine: Negative for hot flashes.  Skin: Negative for itching and rash.  Neurological: Negative for dizziness, extremity weakness and headaches.  Hematological: Negative for adenopathy. Does not bruise/bleed easily.  Psychiatric/Behavioral: Negative for depression. The patient is not nervous/anxious.       PHYSICAL  EXAMINATION  ECOG PERFORMANCE STATUS: 1 - Symptomatic but completely ambulatory  Vitals:   08/12/17 1039  BP: 115/79  Pulse: 85  Resp: 18  Temp: 97.8 F (36.6 C)  SpO2: 100%    Physical Exam  Constitutional: She is oriented to person, place, and time and well-developed, well-nourished, and in no distress.  HENT:  Head: Normocephalic and atraumatic.  Mouth/Throat: Oropharynx is clear and moist. No oropharyngeal exudate.  Eyes: Pupils are equal, round, and reactive to light. No scleral icterus.  Neck: Neck supple.  Cardiovascular: Normal rate, regular rhythm and normal heart sounds.  Pulmonary/Chest: Effort normal and breath sounds normal.  Limited exam, but left breast without any definite mass to palpate, or skin changes that were noted  Abdominal: Soft. Bowel sounds are normal. She exhibits no distension and no mass. There is no tenderness. There is no rebound and no guarding.  Musculoskeletal: She exhibits no edema.  Lymphadenopathy:    She has no cervical adenopathy.  Neurological: She is alert and oriented to person, place, and time.  Skin: Skin is warm and dry. No rash noted.  Psychiatric: Mood and affect normal.    LABORATORY DATA:  CBC    Component Value Date/Time   WBC 2.7 (L) 08/12/2017 0947   WBC 6.8 05/03/2017 1129   RBC 2.77 (L) 08/12/2017 0947   HGB 8.7 (L) 08/12/2017 0947   HCT 25.8 (L) 08/12/2017 0947   PLT 207 08/12/2017 0947   MCV 93.1 08/12/2017 0947   MCH 31.4 08/12/2017 0947   MCHC 33.7 08/12/2017 0947   RDW 16.7 (H) 08/12/2017 0947   LYMPHSABS 0.8 (L) 08/12/2017 0947   MONOABS 0.2 08/12/2017 0947   EOSABS 0.0 08/12/2017 0947   BASOSABS 0.0 08/12/2017 0947    CMP     Component Value Date/Time   NA 139 08/12/2017 0947   K 3.9 08/12/2017 0947   CL 108 08/12/2017 0947   CO2 22 08/12/2017 0947   GLUCOSE 108 08/12/2017 0947   BUN 9 08/12/2017 0947   CREATININE 0.66 08/12/2017 0947   CALCIUM 9.3 08/12/2017 0947   PROT 6.5 08/12/2017 0947    ALBUMIN 3.6 08/12/2017 0947   AST 38 (H) 08/12/2017 0947   ALT 58 (H) 08/12/2017 0947   ALKPHOS 76 08/12/2017 0947   BILITOT 0.6 08/12/2017 0947   GFRNONAA >60 08/12/2017 0947   GFRAA >60 08/12/2017 6073  ASSESSMENT and PLAN:   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    Carrie Miller has completed four cycles of Adriamycin and Cytoxan and is now here for cycle 6 of Abraxane and Carboplatin.  She is tolerating her chemotherapy well.  There was concern over anemia for her and that she would perhaps need a blood transfusion this week after chemotherapy.  Thankfully, her hemoglobin is stable, however that may change in the future, so while we don't need to transfuse today, I did counsel the patient and her brother Carrie Miller that she may need a transfusion next week.  Carrie Miller will proceed with her sixth cycle of treatment today.  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  This is mild and intermittent.  Today it is resolved and it isn't occurring at an increased frequency.  We reviewed in detail peripheral neruopathy, how it may progress, and potential long term effects should it progress.  She will monitor her neuropathy closely and let Lacie know next week how it is doing.    I reviewed her ultrasound report with her and her brother.  They are pleased with the results.    Carrie Miller will return weekly for labs, f/u, and her chemotherapy.     All questions were answered. The patient knows to call the clinic with any problems, questions or concerns. We can certainly see the patient much sooner if necessary.  A total of (30) minutes of face-to-face time was spent with this patient with greater than 50% of that time in counseling and care-coordination.  This note was electronically signed. Scot Dock, NP 08/13/2017

## 2017-08-13 ENCOUNTER — Encounter: Payer: Self-pay | Admitting: Adult Health

## 2017-08-13 ENCOUNTER — Telehealth: Payer: Self-pay | Admitting: Adult Health

## 2017-08-13 NOTE — Assessment & Plan Note (Signed)
22 y/o woman with clinical stage IIIC invasive ductal carcinoma, triple negative, currently undergoing neoadjuvant chemotherapy.    Carrie Miller has completed four cycles of Adriamycin and Cytoxan and is now here for cycle 6 of Abraxane and Carboplatin.  She is tolerating her chemotherapy well.  There was concern over anemia for her and that she would perhaps need a blood transfusion this week after chemotherapy.  Thankfully, her hemoglobin is stable, however that may change in the future, so while we don't need to transfuse today, I did counsel the patient and her brother Carrie Miller that she may need a transfusion next week.  Carrie Miller will proceed with her sixth cycle of treatment today.  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  This is mild and intermittent.  Today it is resolved and it isn't occurring at an increased frequency.  We reviewed in detail peripheral neruopathy, how it may progress, and potential long term effects should it progress.  She will monitor her neuropathy closely and let Carrie Miller know next week how it is doing.    I reviewed her ultrasound report with her and her brother.  They are pleased with the results.    Carrie Miller will return weekly for labs, f/u, and her chemotherapy.

## 2017-08-13 NOTE — Telephone Encounter (Signed)
Per 4/25 no los 

## 2017-08-19 ENCOUNTER — Inpatient Hospital Stay: Payer: Medicaid Other

## 2017-08-19 ENCOUNTER — Inpatient Hospital Stay: Payer: Medicaid Other | Attending: Nurse Practitioner | Admitting: Nurse Practitioner

## 2017-08-19 ENCOUNTER — Telehealth: Payer: Self-pay | Admitting: Nurse Practitioner

## 2017-08-19 ENCOUNTER — Encounter: Payer: Self-pay | Admitting: Nurse Practitioner

## 2017-08-19 VITALS — BP 121/70 | HR 95 | Temp 98.2°F | Resp 14 | Ht 64.0 in | Wt 207.5 lb

## 2017-08-19 DIAGNOSIS — C50812 Malignant neoplasm of overlapping sites of left female breast: Secondary | ICD-10-CM

## 2017-08-19 DIAGNOSIS — R51 Headache: Secondary | ICD-10-CM | POA: Diagnosis not present

## 2017-08-19 DIAGNOSIS — G620 Drug-induced polyneuropathy: Secondary | ICD-10-CM

## 2017-08-19 DIAGNOSIS — D6481 Anemia due to antineoplastic chemotherapy: Secondary | ICD-10-CM | POA: Diagnosis not present

## 2017-08-19 DIAGNOSIS — C773 Secondary and unspecified malignant neoplasm of axilla and upper limb lymph nodes: Secondary | ICD-10-CM | POA: Diagnosis present

## 2017-08-19 DIAGNOSIS — Z95828 Presence of other vascular implants and grafts: Secondary | ICD-10-CM

## 2017-08-19 DIAGNOSIS — N951 Menopausal and female climacteric states: Secondary | ICD-10-CM | POA: Diagnosis not present

## 2017-08-19 DIAGNOSIS — T451X5A Adverse effect of antineoplastic and immunosuppressive drugs, initial encounter: Secondary | ICD-10-CM | POA: Diagnosis not present

## 2017-08-19 DIAGNOSIS — F419 Anxiety disorder, unspecified: Secondary | ICD-10-CM

## 2017-08-19 DIAGNOSIS — Z5111 Encounter for antineoplastic chemotherapy: Secondary | ICD-10-CM | POA: Insufficient documentation

## 2017-08-19 DIAGNOSIS — R52 Pain, unspecified: Secondary | ICD-10-CM

## 2017-08-19 LAB — CBC WITH DIFFERENTIAL (CANCER CENTER ONLY)
BASOS PCT: 1 %
Basophils Absolute: 0 10*3/uL (ref 0.0–0.1)
EOS ABS: 0 10*3/uL (ref 0.0–0.5)
EOS PCT: 0 %
HCT: 25.8 % — ABNORMAL LOW (ref 34.8–46.6)
HEMOGLOBIN: 8.7 g/dL — AB (ref 11.6–15.9)
LYMPHS ABS: 0.8 10*3/uL — AB (ref 0.9–3.3)
Lymphocytes Relative: 29 %
MCH: 31.9 pg (ref 25.1–34.0)
MCHC: 33.7 g/dL (ref 31.5–36.0)
MCV: 94.5 fL (ref 79.5–101.0)
MONO ABS: 0.2 10*3/uL (ref 0.1–0.9)
MONOS PCT: 9 %
NEUTROS PCT: 61 %
Neutro Abs: 1.7 10*3/uL (ref 1.5–6.5)
Platelet Count: 207 10*3/uL (ref 145–400)
RBC: 2.73 MIL/uL — ABNORMAL LOW (ref 3.70–5.45)
RDW: 16.6 % — AB (ref 11.2–14.5)
WBC Count: 2.8 10*3/uL — ABNORMAL LOW (ref 3.9–10.3)

## 2017-08-19 LAB — CMP (CANCER CENTER ONLY)
ALBUMIN: 3.8 g/dL (ref 3.5–5.0)
ALT: 45 U/L (ref 0–55)
AST: 32 U/L (ref 5–34)
Alkaline Phosphatase: 82 U/L (ref 40–150)
Anion gap: 6 (ref 3–11)
BUN: 7 mg/dL (ref 7–26)
CALCIUM: 9.4 mg/dL (ref 8.4–10.4)
CHLORIDE: 109 mmol/L (ref 98–109)
CO2: 24 mmol/L (ref 22–29)
CREATININE: 0.66 mg/dL (ref 0.60–1.10)
GFR, Estimated: >60 mL/min (ref >60)
GLUCOSE: 105 mg/dL (ref 70–140)
Potassium: 4 mmol/L (ref 3.5–5.1)
SODIUM: 139 mmol/L (ref 136–145)
Total Bilirubin: 0.4 mg/dL (ref 0.2–1.2)
Total Protein: 6.7 g/dL (ref 6.4–8.3)

## 2017-08-19 MED ORDER — SODIUM CHLORIDE 0.9 % IV SOLN
300.0000 mg | Freq: Once | INTRAVENOUS | Status: AC
  Administered 2017-08-19: 300 mg via INTRAVENOUS
  Filled 2017-08-19: qty 30

## 2017-08-19 MED ORDER — PACLITAXEL PROTEIN-BOUND CHEMO INJECTION 100 MG
80.0000 mg/m2 | Freq: Once | INTRAVENOUS | Status: AC
  Administered 2017-08-19: 150 mg via INTRAVENOUS
  Filled 2017-08-19: qty 30

## 2017-08-19 MED ORDER — ALPRAZOLAM 0.25 MG PO TABS: 0.2500 mg | ORAL_TABLET | Freq: Every evening | ORAL | 0 refills | Status: AC | PRN

## 2017-08-19 MED ORDER — ACETAMINOPHEN-CODEINE #3 300-30 MG PO TABS: 1.0000 | ORAL_TABLET | Freq: Four times a day (QID) | ORAL | 0 refills | Status: DC | PRN

## 2017-08-19 MED ORDER — DEXAMETHASONE SODIUM PHOSPHATE 10 MG/ML IJ SOLN
10.0000 mg | Freq: Once | INTRAMUSCULAR | Status: AC
  Administered 2017-08-19: 10 mg via INTRAVENOUS

## 2017-08-19 MED ORDER — PROCHLORPERAZINE MALEATE 10 MG PO TABS: 10.0000 mg | ORAL_TABLET | Freq: Four times a day (QID) | ORAL | 1 refills | Status: DC | PRN

## 2017-08-19 MED ORDER — ANTICOAGULANT SODIUM CITRATE 4% (200MG/5ML) IV SOLN
5.0000 mL | Freq: Once | Status: AC
  Administered 2017-08-19: 5 mL via INTRAVENOUS
  Filled 2017-08-19: qty 5

## 2017-08-19 MED ORDER — GOSERELIN ACETATE 3.6 MG ~~LOC~~ IMPL: 3.6000 mg | DRUG_IMPLANT | Freq: Once | SUBCUTANEOUS | Status: DC

## 2017-08-19 MED ORDER — SODIUM CHLORIDE 0.9 % IV SOLN
Freq: Once | INTRAVENOUS | Status: AC
  Administered 2017-08-19: 14:00:00 via INTRAVENOUS

## 2017-08-19 MED ORDER — PALONOSETRON HCL INJECTION 0.25 MG/5ML
INTRAVENOUS | Status: AC
  Filled 2017-08-19: qty 5

## 2017-08-19 MED ORDER — SODIUM CHLORIDE 0.9% FLUSH
10.0000 mL | INTRAVENOUS | Status: DC | PRN
  Administered 2017-08-19: 10 mL
  Filled 2017-08-19: qty 10

## 2017-08-19 MED ORDER — DULOXETINE HCL 20 MG PO CPEP: 20.0000 mg | ORAL_CAPSULE | Freq: Every day | ORAL | 0 refills | Status: DC

## 2017-08-19 MED ORDER — DEXAMETHASONE SODIUM PHOSPHATE 10 MG/ML IJ SOLN
INTRAMUSCULAR | Status: AC
  Filled 2017-08-19: qty 1

## 2017-08-19 MED ORDER — PALONOSETRON HCL INJECTION 0.25 MG/5ML
0.2500 mg | Freq: Once | INTRAVENOUS | Status: AC
  Administered 2017-08-19: 0.25 mg via INTRAVENOUS

## 2017-08-19 MED FILL — PROCHLORPERAZINE 10 MG TAB: 10 | 7 days supply | Qty: 30 | Fill #1

## 2017-08-19 MED FILL — ACETAMINOPHEN/COD #3 TABLET: 300-30 | 7 days supply | Qty: 30 | Fill #0

## 2017-08-19 MED FILL — DULoxetine HCL 20 MG CPEP: 20 | 30 days supply | Qty: 30 | Fill #0

## 2017-08-19 MED FILL — ALPRAZolam 0.25 MG TABS: 0.25 | 30 days supply | Qty: 30 | Fill #0

## 2017-08-19 NOTE — Progress Notes (Signed)
[name]  [phone] [phone]  Clinic Follow up Note   Patient Care Team: Patient, No Pcp Per as PCP - General ([place]) [name], MD as Consulting Physician (General Surgery) [name], MD as Consulting Physician (Hematology) [name], NP as Nurse Practitioner (Nurse Practitioner) 08/19/2017  SUMMARY OF ONCOLOGIC HISTORY:   Cancer of overlapping sites of left breast ([name])   04/15/2017 Mammogram    IMPRESSION: 1. Highly suspicious irregular mass within the left breast at the 4 o'clock axis, 8 cm from the nipple, measuring 5.2 cm, corresponding to the area of clinical concern. Ultrasound-guided biopsy is recommended. 2. Additional suspicious irregular mass within the left breast at the 2 o'clock axis, 8 cm from the nipple, measuring 1.6 cm, corresponding to the mammographic finding. Ultrasound-guided biopsy is recommended. 3. Additional suspicious mass within the left breast at the 2 o'clock axis, 10 cm from the nipple, axillary tail region, measuring 3 cm, suspected lymph node completely replaced by tumor. Ultrasound-guided biopsy is recommended. 4. No evidence of malignancy within the right breast.       04/15/2017 Breast US    Left breast: Targeted ultrasound is performed, showing an irregular mass in the left breast at the 4 o'clock axis, 8 cm from the nipple, measuring 5.2 x 4.3 x 4.3 cm, with internal vascularity, corresponding to the mammographic finding and palpable lump.  There is an additional irregular hypoechoic mass in the left breast at the 2 o'clock axis, 8 cm from the nipple, measuring 1.6 x 1.3 x 1.3 cm, with internal vascularity, corresponding to the additional mass seen within the outer left breast on mammogram, suspected satellite mass.  Lastly, there is an oval circumscribed hypoechoic mass in the left breast at the 2 o'clock axis, 10 cm from the nipple, axillary tail region, with heterogeneous  echotexture, measuring 3 x 2.3 x 2.8 cm, suspected lymph node completely replaced by tumor.  Remainder of the left axilla was evaluated with ultrasound showing no additional enlarged or morphologically abnormal lymph nodes.  IMPRESSION: 1. Highly suspicious irregular mass within the left breast at the 4 o'clock axis, 8 cm from the nipple, measuring 5.2 cm, corresponding to the area of clinical concern. Ultrasound-guided biopsy is recommended. 2. Additional suspicious irregular mass within the left breast at the 2 o'clock axis, 8 cm from the nipple, measuring 1.6 cm, corresponding to the mammographic finding. Ultrasound-guided biopsy is recommended. 3. Additional suspicious mass within the left breast at the 2 o'clock axis, 10 cm from the nipple, axillary tail region, measuring 3 cm, suspected lymph node completely replaced by tumor. Ultrasound-guided biopsy is recommended. 4. No evidence of malignancy within the right breast.       04/19/2017 Initial Biopsy    Diagnosis 1. Breast, left, needle core biopsy, 4:00 o'clock, ribbon clip - INVASIVE DUCTAL CARCINOMA. - SEE COMMENT. 2. Breast, left, needle core biopsy, 2:00 o'clock, coil clip - INVASIVE DUCTAL CARCINOMA. - SEE COMMENT. 3. Lymph node, needle/core biopsy, left axilla, spiral hydromark - DUCTAL CARCINOMA. - SEE COMMENT.  2. PROGNOSTIC INDICATORS Results: IMMUNOHISTOCHEMICAL AND MORPHOMETRIC ANALYSIS PERFORMED MANUALLY Estrogen Receptor: 0%, NEGATIVE Progesterone Receptor: 0%, NEGATIVE Proliferation Marker Ki67: 80%  2. FLUORESCENCE IN-SITU HYBRIDIZATION Results: HER2 - NEGATIVE RATIO OF HER2/CEP17 SIGNALS 1.53 AVERAGE HER2 COPY NUMBER PER CELL 2.75  Microscopic Comment 1. The carcinoma in the three specimens is morphologically similar and is grade III. Lymph nodal tissue is not definitively identified in specimen #3. A breast prognostic profile will be performed  on part 2 and the results reported separately.       04/28/2017 Initial Diagnosis    Cancer of overlapping sites of left breast ([name])      04/30/2017 Breast MRI    IMPRESSION: 1. Biopsy-proven invasive carcinoma within the lower outer quadrant of the LEFT breast, 4 o'clock axis, at posterior depth, measuring 4.8 cm, with associated biopsy clip artifact. 2. Biopsy-proven invasive carcinoma within the upper-outer quadrant of the LEFT breast, 2 o'clock axis, at posterior depth, measuring 2.5 cm, with associated biopsy clip artifact. However, contiguous non mass enhancement along the posterior margin of this 2 o'clock mass and extending 3 cm anteriorly from the mass increases the overall measurement to 5.5 cm greatest dimension (AP). 3. Diffuse edema throughout the LEFT breast, with particularly prominent component of edema extending posteriorly to abut the anterior surface of the pectoralis muscle, and diffuse skin thickening throughout the left breast. This almost certainly indicates INFLAMMATORY BREAST CANCER. 4. Biopsy-proven metastatic lymph node within the LEFT axilla measures 4.1 cm. Two additional borderline prominent lymph nodes within the left axilla. No enlarged lymph nodes within the right axilla or internal mammary chain regions. 5. No evidence of malignancy within the RIGHT breast.  RECOMMENDATION: Per current treatment plan for patient's known left breast cancer (2 biopsy-proven sites) and metastatic lymph node in the left axilla.  BI-RADS CATEGORY  6: Known biopsy-proven malignancy.       04/30/2017 Echocardiogram    Study Conclusions  - Left ventricle: The cavity size was normal. Wall thickness was   normal. Systolic function was normal. The estimated ejection   fraction was in the range of 60% to 65%. Wall motion was normal;   there were no regional wall motion abnormalities. Left   ventricular diastolic function parameters were normal. GLS:   -20.4%, RV S vel 12.1cm/s.      05/05/2017 Imaging    MRI BRAIN  IMPRESSION: No intracranial or calvarial metastatic disease. Normal MRI of the brain.      05/07/2017 Imaging    CT CAP IMPRESSION: Two left breast masses and a grossly abnormal 3.9 cm lower left axillary lymph node, likely correspond to multifocal left breast cancer metastatic to the left axilla. Skin thickening of the left breast, raising concern for an inflammatory breast cancer.  1 cm hypoattenuated lesion in the posterior dome of the liver with internal attenuation slightly higher than water. This may represent a complicated liver cyst, hemangioma or a focus of metastatic disease.  No other findings to suggest distal metastatic disease.       05/07/2017 Imaging    BONE SCAN IMPRESSION: 1. No evidence of osseous metastases. 2. Abnormal uptake in the soft tissues of the left breast likely by the patient's known breast cancer.       05/11/2017 Genetic Testing    Common Cancers panel (47 genes) @ [facility] - No pathoagenic mutations detected Five Variants of Uncertain Significance were detected:  APC c.2438A>G (p.Asn813Ser)  BARD1 c.782T>C (p.Leu261Pro) MSH3 c.1655C>T (p.Thr552Ile)  MSH6 c.2108T>C (p.Met703Thr)  NTHL1 c.470G>C (p.Arg157Pro)   Genes Analyzed: 47 genes on [facility]'s Common Cancers panel (APC, ATM, AXIN2, BARD1, BMPR1A, BRCA1, BRCA2, BRIP1, CDH1, CDK4, CDKN2A, CHEK2, CTNNA1, DICER1, EPCAM, GREM1, HOXB13, KIT, MEN1, MLH1, MSH2, MSH3, MSH6, MUTYH, NBN, NF1, NTHL1, PALB2, PDGFRA, PMS2, POLD1, POLE, PTEN, RAD50, RAD51C, RAD51D, SDHA, SDHB, SDHC, SDHD, SMAD4, SMARCA4, STK11, TP53, TSC1, TSC2, VHL).       05/12/2017 -  Chemotherapy    1. Neoadjuvant AC q2 weeks x4  cycles then weekly 05/12/17 - 06/24/17 followed by carbo/taxol for 12 weeks starting 07/08/17. Due to poor toleration, changed Taxol to abraxane and added Carboplatin with cycle 2.  2. Monthly zoladex injections starting 04/30/17 for the duration of chemotherapy.       08/06/2017 Mammogram    The previously  demonstrated rounded mass in the 2 o'clock position of the left breast, containing a coil shaped biopsy marker clip, is also significantly smaller, less well-defined and has interspersed fat. This currently measures 0.7 x 0.5 x 0.4 cm in maximum dimensions, previously 1.8 x 1.5 x 1.3 cm mammographically.  IMPRESSION: 1. No evidence of malignancy at the location of recent palpable concern in the upper outer left breast. 2. Marked decrease in size and conspicuity of the biopsy-proven invasive ductal carcinomas in the 2 o'clock and 4 o'clock positions of the left breast with no discrete residual masses. There is only mild ill-defined residual soft tissue density and interspersed fat at those locations and associated architectural distortion in the 4 o'clock position. 3. Complete imaging resolution of the previously biopsied metastatic left inferior axillary lymph node.     CURRENT THERAPY:  1. Neoadjuvant AC q2 weeks x4 cycles then weekly1/23/19 - 3/7/19followed by carbo/taxol for 12 weeks starting 07/08/17. Due to poor toleration, changed Taxol to abraxane and added Carboplatin with cycle 2. 2. Monthly zoladex injections starting 04/30/17 for the duration of chemotherapy.    INTERVAL HISTORY: Ms. Madera returns for follow up as scheduled prior to cycle 7 weekly abraxane/carboplatin. She completed cycle 6 on 08/12/17. She had increased constant generalized bone pain after last cycle. Gets approximately 2-3 hours relief with tramadol, tylenol, and ibuprofen. Pain usually around 7/10, often limits her normal activities and keeps her from sleep. Her nails are darkening and painful, not detached or draining. Rarely has neuropathy to fingertips. Headaches are improved. She has fatigue and tires easily, increased from last cycle. Has normal appetite and regular BM. Takes compazine 2 times after chemo which controls nausea well. Has occasional shortness of breath on exertion, no cough or chest pain. Denies  recent fever or chills.   REVIEW OF SYSTEMS:   Constitutional: Denies fevers, chills or abnormal weight loss (+) fatigue, increased  Eyes: Denies blurriness of vision Ears, nose, mouth, throat, and face: Denies mucositis or sore throat Respiratory: Denies cough or wheezes (+) occasional DOE Cardiovascular: Denies palpitation, chest discomfort or lower extremity swelling Gastrointestinal:  Denies nausea, vomiting, constipation, diarrhea, heartburn or change in bowel habits Skin: Denies abnormal skin rashes (+) nail darkening, pain  Lymphatics: Denies new lymphadenopathy or easy bruising Neurological:Denies numbness, tingling or new weaknesses (+) mild neuropathy, rare (+) occasional dizziness, stable (+) headaches, improved overall  Behavioral/Psych: Mood is stable, no new changes (+) anxiety about upcoming breast surgery  MSK: (+) constant, generalized diffuse body pain 7/10  All other systems were reviewed with the patient and are negative.  MEDICAL HISTORY:  Past Medical History:  Diagnosis Date  . Breast cancer ([place]) left  . Family history of cancer   . Genetic testing 04/29/2017   Common Cancers panel (47 genes) @ [facility] - No pathoagenic mutations detected    SURGICAL HISTORY: Past Surgical History:  Procedure Laterality Date  . IR FLUORO GUIDE PORT INSERTION RIGHT  05/03/2017  . IR US GUIDE VASC ACCESS RIGHT  05/03/2017    I have reviewed the social history and family history with the patient and they are unchanged from previous note.  ALLERGIES:  is allergic to heparin.  MEDICATIONS:  Current Outpatient Medications  Medication Sig Dispense Refill  . ALPRAZolam (XANAX) 0.25 MG tablet Take 1 tablet (0.25 mg total) by mouth at bedtime as needed for anxiety. 30 tablet 0  . ibuprofen (ADVIL,MOTRIN) 200 MG tablet Take 2 tablets (400 mg total) by mouth every 6 (six) hours as needed. 30 tablet 0  . lidocaine-prilocaine (EMLA) cream Apply to affected area once 30 g 3  .  LORazepam (ATIVAN) 0.5 MG tablet Take 1 tablet (0.5 mg total) by mouth every 8 (eight) hours as needed (nausea, vomiting). 20 tablet 0  . ondansetron (ZOFRAN) 8 MG tablet Take 1 tablet (8 mg total) by mouth 2 (two) times daily as needed. Start on the third day after chemotherapy. 30 tablet 1  . prochlorperazine (COMPAZINE) 10 MG tablet Take 1 tablet (10 mg total) by mouth every 6 (six) hours as needed (Nausea or vomiting). 30 tablet 1  . acetaminophen-codeine (TYLENOL #3) 300-30 MG tablet Take 1 tablet by mouth every 6 (six) hours as needed for moderate pain. 30 tablet 0  . DULoxetine (CYMBALTA) 20 MG capsule Take 1 capsule (20 mg total) by mouth daily. 30 capsule 0   No current facility-administered medications for this visit.    Facility-Administered Medications Ordered in Other Visits  Medication Dose Route Frequency Provider Last Rate Last Dose  . anticoagulant sodium citrate solution 5 mL  5 mL Intravenous Once [name], MD      . CARBOplatin (PARAPLATIN) 300 mg in sodium chloride 0.9 % 250 mL chemo infusion  300 mg Intravenous Once [name], MD 560 mL/hr at 08/19/17 1636 300 mg at 08/19/17 1636  . goserelin (ZOLADEX) injection 3.6 mg  3.6 mg Subcutaneous Once Cira Rue K, NP      . sodium chloride flush (NS) 0.9 % injection 10 mL  10 mL Intracatheter PRN [name], MD        PHYSICAL EXAMINATION: ECOG PERFORMANCE STATUS: 2 - Symptomatic, <50% confined to bed  Vitals:   08/19/17 1619  BP: 121/70  Pulse: 95  Resp: 14  Temp: 98.2 F (36.8 C)  SpO2: 100%   Filed Weights   08/19/17 1619  Weight: 207 lb 8 oz (94.1 kg)    GENERAL:alert, no distress and comfortable SKIN: skin color, texture, turgor are normal, no rashes or significant lesions. (+) hyperpigmentation to nail beds EYES: normal, Conjunctiva are pink and non-injected, sclera clear OROPHARYNX:no exudate, no erythema and lips, buccal mucosa (+) tongue discoloration  LYMPH:  no palpable cervical, supraclavicular, or  axillary lymphadenopathy  LUNGS: clear to auscultation with normal breathing effort HEART: regular rate & rhythm and no murmurs and no lower extremity edema ABDOMEN: abdomen soft, non-tender and normal bowel sounds Musculoskeletal:no cyanosis of digits and no clubbing  NEURO: alert & oriented x 3 with fluent speech, no focal motor/sensory deficits BREASTS inspections shows them to be symmetrical without nipple discharge or inversion. Right breast and axilla are benign. (+) left breast with similar soft tissue thickness in the upper central and upper outer portions, without discernable mass. No other palpable mass in left breast or axilla  PAC without erythema   LABORATORY DATA:  I have reviewed the data as listed CBC Latest Ref Rng & Units 08/19/2017 08/12/2017 08/05/2017  WBC 3.9 - 10.3 K/uL 2.8(L) 2.7(L) 3.2(L)  Hemoglobin 11.6 - 15.9 g/dL 8.7(L) 8.7(L) 8.7(L)  Hematocrit 34.8 - 46.6 % 25.8(L) 25.8(L) 26.2(L)  Platelets 145 - 400 K/uL 207 207 247     CMP Latest Ref  Rng & Units 08/19/2017 08/12/2017 08/05/2017  Glucose 70 - 140 mg/dL 105 108 103  BUN 7 - 26 mg/dL _0 Creatinine 0.60 - 1.10 mg/dL 0.66 0.66 0.67  Sodium 136 - 145 mmol/L 139 139 139  Potassium 3.5 - 5.1 mmol/L 4.0 3.9 3.9  Chloride 98 - 109 mmol/L 109 108 108  CO2 22 - 29 mmol/L _1 Calcium 8.4 - 10.4 mg/dL 9.4 9.3 9.1  Total Protein 6.4 - 8.3 g/dL 6.7 6.5 6.6  Total Bilirubin 0.2 - 1.2 mg/dL 0.4 0.6 0.5  Alkaline Phos 40 - 150 U/L 82 76 81  AST 5 - 34 U/L 32 38(H) 37(H)  ALT 0 - 55 U/L 45 58(H) 55   PATHOLOGY  Diagnosis 05/10/17 1. Cervix, biopsy, 11 o'clock - ATYPICAL SQUAMOUS METAPLASIA ASSOCIATED WITH INFLAMMATION. - SEE COMMENT. 2. Endocervix, curettage - DETACHED FRAGMENTS OF SQUAMOUS MUCOSA WITH SLIGHT ATYPIA. - BENIGN ENDOCERVICAL MUCOSA. - SEE COMMENT. Microscopic Comment 1. The sections show multiple fragments of mostly metaplastic squamous mucosa displaying prominent chronic and acute  inflammation to a lesser extent. This is associated with epithelial atypia in the form of nuclear enlargement, and small nucleoli. The inflammatory process obscures the epithelium which is somewhat difficult to evaluate. A p16 stain was performed and is negative. Overall, definitive or diagnostic squamous intraepithelial lesion is not appreciated in this setting, and the changes may represent a florid reactive state. Nonetheless, clinical correlation and follow up is strongly recommended. 2. The sections show multiple detached fragments of squamous mucosa, some of which display slight atypia. This is admixed with multiple fragments of benign endocervical mucosa. A p16 stain was performed and is negative. The overall changes are not specific or diagnostic of a squamous intraepithelial lesion. Clinical correlation and follow up is recommended. (BNS:ah 05/12/17)    Diagnosis 04/19/17 1. Breast, left, needle core biopsy, 4:00 o'clock, ribbon clip - INVASIVE DUCTAL CARCINOMA. - SEE COMMENT. 2. Breast, left, needle core biopsy, 2:00 o'clock, coil clip - INVASIVE DUCTAL CARCINOMA. - SEE COMMENT. 3. Lymph node, needle/core biopsy, left axilla, spiral hydromark - DUCTAL CARCINOMA. - SEE COMMENT. Microscopic Comment 1. The carcinoma in the three specimens is morphologically similar and is grade III. Lymph nodal tissue is not definitively identified in specimen #3. A breast prognostic profile will be performed on part 2 and the results reported separately. 2. FLUORESCENCE IN-SITU HYBRIDIZATION Results: HER2 - NEGATIVE RATIO OF HER2/CEP17 SIGNALS 1.53 AVERAGE HER2 COPY NUMBER PER CELL 2.75 2. PROGNOSTIC INDICATORS Results: IMMUNOHISTOCHEMICAL AND MORPHOMETRIC ANALYSIS PERFORMED MANUALLY Estrogen Receptor: 0%, NEGATIVE Progesterone Receptor: 0%, NEGATIVE Proliferation Marker Ki67: 80% COMMENT: The negative hormone receptor study(ies) in this case has An internal positive  control.   RADIOGRAPHIC STUDIES: I have personally reviewed the radiological images as listed and agreed with the findings in the report. No results found.   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 [place] appears stable. She completed cycle 6 weekly abraxane/carboplatin on 08/13/17. She is tolerating moderately well with fatigue, moderate - severe bone pain, and nail toxicity. I recommend water and vinegar soaks for her nails. We reviewed her interim mammogram and [place] which fortunately shows marked decrease in size of the biopsy proven malignancies  in the 2 o'clock and 4 o'clock positions of the left breast with no discrete residual mass. There was complete imaging resolution of the previously biopsied metastatic left inferior axillary lymph node. There was no concerning findings at the location of soft tissue thickness on her physical exam. Will forward results to Dr. Barry Dienes for surgical planning. Will continue current plan to complete neoadjuvant chemotherapy. Labs reviewed, she remains moderately anemic, Hgb 8.7. Labs otherwise adequate for treatment.  -Proceed with cycle 7 neoadjuvant abraxane/carboplatin. Return in 1 week for f/u and next cycle.   2. Diffuse generalized body/bone pain  -She experienced bone pain with taxol that improved significantly with abraxane. Generalized body pain has increased lately, now 7/10 and not well controlled with tramadol, tylenol, and ibuprofen. She is limited in activities and sleep due to pain. Will try tylenol #3 and cymbalta to see if this helps.   3. Headaches, dizziness -Overall stable. Anemia  likely contributing   4. Anxiety -she is anxious about her surgical options. She requests refill of xanax, which she did not fill initially. I refilled today  5. Social issues  -followed by [employer], SW, chaplain   6. Genetics - negative for pathogenic mutation 05/11/17, VUS in APC, BARD1, MSH3, MSH6, and NTHL1  7. Fertility planning  - monthly zoladex beginning 04/30/17 for duration of chemotherapy   8. Irregular vaginal bleeding -f/u with GYN -currently denies bleeding   9. 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 Chemotherapy-induced anemia -hgb has remained stable at 8.7 x3 weeks. She may need transfusion in the near future. Will continue to monitor.   PLAN: -labs and imaging reviewed, proceed with cycle 7 neoadjuvant abraxane/carboplatin at current doses -Refilled xanax, compazine  -Prescriptions: tylenol #3, cymbalta  -Water and vinegar soaks for nails  -Message to Dr. Barry Dienes to review imaging for surgical planning   All questions were answered. The patient knows to call the clinic with any problems, questions or concerns. No barriers to learning was detected. I spent 20 minutes counseling the patient face to face. The total time spent in the appointment was 25 minutes and more than 50% was on counseling and review of test results     [name], NP 08/19/17

## 2017-08-19 NOTE — Patient Instructions (Signed)
Implanted Port Home Guide An implanted port is a type of central line that is placed under the skin. Central lines are used to provide IV access when treatment or nutrition needs to be given through a person's veins. Implanted ports are used for long-term IV access. An implanted port may be placed because:  You need IV medicine that would be irritating to the small veins in your hands or arms.  You need long-term IV medicines, such as antibiotics.  You need IV nutrition for a long period.  You need frequent blood draws for lab tests.  You need dialysis.  Implanted ports are usually placed in the chest area, but they can also be placed in the upper arm, the abdomen, or the leg. An implanted port has two main parts:  Reservoir. The reservoir is round and will appear as a small, raised area under your skin. The reservoir is the part where a needle is inserted to give medicines or draw blood.  Catheter. The catheter is a thin, flexible tube that extends from the reservoir. The catheter is placed into a large vein. Medicine that is inserted into the reservoir goes into the catheter and then into the vein.  How will I care for my incision site? Do not get the incision site wet. Bathe or shower as directed by your health care provider. How is my port accessed? Special steps must be taken to access the port:  Before the port is accessed, a numbing cream can be placed on the skin. This helps numb the skin over the port site.  Your health care provider uses a sterile technique to access the port. ? Your health care provider must put on a mask and sterile gloves. ? The skin over your port is cleaned carefully with an antiseptic and allowed to dry. ? The port is gently pinched between sterile gloves, and a needle is inserted into the port.  Only "non-coring" port needles should be used to access the port. Once the port is accessed, a blood return should be checked. This helps ensure that the port  is in the vein and is not clogged.  If your port needs to remain accessed for a constant infusion, a clear (transparent) bandage will be placed over the needle site. The bandage and needle will need to be changed every week, or as directed by your health care provider.  Keep the bandage covering the needle clean and dry. Do not get it wet. Follow your health care provider's instructions on how to take a shower or bath while the port is accessed.  If your port does not need to stay accessed, no bandage is needed over the port.  What is flushing? Flushing helps keep the port from getting clogged. Follow your health care provider's instructions on how and when to flush the port. Ports are usually flushed with saline solution or a medicine called heparin. The need for flushing will depend on how the port is used.  If the port is used for intermittent medicines or blood draws, the port will need to be flushed: ? After medicines have been given. ? After blood has been drawn. ? As part of routine maintenance.  If a constant infusion is running, the port may not need to be flushed.  How long will my port stay implanted? The port can stay in for as long as your health care provider thinks it is needed. When it is time for the port to come out, surgery will be   done to remove it. The procedure is similar to the one performed when the port was put in. When should I seek immediate medical care? When you have an implanted port, you should seek immediate medical care if:  You notice a bad smell coming from the incision site.  You have swelling, redness, or drainage at the incision site.  You have more swelling or pain at the port site or the surrounding area.  You have a fever that is not controlled with medicine.  This information is not intended to replace advice given to you by your health care provider. Make sure you discuss any questions you have with your health care provider. Document  Released: 04/06/2005 Document Revised: 09/12/2015 Document Reviewed: 12/12/2012 [employer] Interactive Patient Education  2017 [employer]  

## 2017-08-19 NOTE — Telephone Encounter (Signed)
Appointments scheduled AVS/Calendar printed per 5/2 los °

## 2017-08-19 NOTE — Patient Instructions (Signed)
[facility] Discharge Instructions for Patients Receiving Chemotherapy  Today you received the following chemotherapy agents: Carboplatin (Paraplatin) and Paclitaxel-protein bound (Abraxane)  To help prevent nausea and vomiting after your treatment, we encourage you to take your nausea medication as prescribed. Received Aloxi during treatment today-->Take Compazine (not Zofran) for the next 3 day as needed.  If you develop nausea and vomiting that is not controlled by your nausea medication, call the clinic.   BELOW ARE SYMPTOMS THAT SHOULD BE REPORTED IMMEDIATELY:  *FEVER GREATER THAN 100.5 F  *CHILLS WITH OR WITHOUT FEVER  NAUSEA AND VOMITING THAT IS NOT CONTROLLED WITH YOUR NAUSEA MEDICATION  *UNUSUAL SHORTNESS OF BREATH  *UNUSUAL BRUISING OR BLEEDING  TENDERNESS IN MOUTH AND THROAT WITH OR WITHOUT PRESENCE OF ULCERS  *URINARY PROBLEMS  *BOWEL PROBLEMS  UNUSUAL RASH Items with * indicate a potential emergency and should be followed up as soon as possible.  Feel free to call the clinic should you have any questions or concerns. The clinic phone number is [phone].  Please show the [facility] at check-in to the Emergency Department and triage nurse.

## 2017-08-26 ENCOUNTER — Inpatient Hospital Stay (HOSPITAL_BASED_OUTPATIENT_CLINIC_OR_DEPARTMENT_OTHER): Payer: Medicaid Other | Admitting: Nurse Practitioner

## 2017-08-26 ENCOUNTER — Inpatient Hospital Stay: Payer: Medicaid Other

## 2017-08-26 ENCOUNTER — Encounter: Payer: Self-pay | Admitting: Nurse Practitioner

## 2017-08-26 ENCOUNTER — Ambulatory Visit: Payer: Medicaid Other | Admitting: Hematology

## 2017-08-26 VITALS — BP 122/77 | HR 97 | Temp 98.0°F | Resp 18 | Ht 64.0 in | Wt 210.5 lb

## 2017-08-26 DIAGNOSIS — D6481 Anemia due to antineoplastic chemotherapy: Secondary | ICD-10-CM

## 2017-08-26 DIAGNOSIS — C50812 Malignant neoplasm of overlapping sites of left female breast: Secondary | ICD-10-CM | POA: Diagnosis not present

## 2017-08-26 DIAGNOSIS — D701 Agranulocytosis secondary to cancer chemotherapy: Secondary | ICD-10-CM | POA: Diagnosis not present

## 2017-08-26 DIAGNOSIS — T451X5A Adverse effect of antineoplastic and immunosuppressive drugs, initial encounter: Secondary | ICD-10-CM

## 2017-08-26 DIAGNOSIS — R42 Dizziness and giddiness: Secondary | ICD-10-CM

## 2017-08-26 DIAGNOSIS — N951 Menopausal and female climacteric states: Secondary | ICD-10-CM | POA: Diagnosis not present

## 2017-08-26 DIAGNOSIS — F419 Anxiety disorder, unspecified: Secondary | ICD-10-CM | POA: Diagnosis not present

## 2017-08-26 DIAGNOSIS — N939 Abnormal uterine and vaginal bleeding, unspecified: Secondary | ICD-10-CM | POA: Diagnosis not present

## 2017-08-26 DIAGNOSIS — R5383 Other fatigue: Secondary | ICD-10-CM

## 2017-08-26 DIAGNOSIS — D493 Neoplasm of unspecified behavior of breast: Secondary | ICD-10-CM

## 2017-08-26 DIAGNOSIS — Z95828 Presence of other vascular implants and grafts: Secondary | ICD-10-CM

## 2017-08-26 DIAGNOSIS — C773 Secondary and unspecified malignant neoplasm of axilla and upper limb lymph nodes: Secondary | ICD-10-CM

## 2017-08-26 DIAGNOSIS — Z171 Estrogen receptor negative status [ER-]: Secondary | ICD-10-CM | POA: Diagnosis not present

## 2017-08-26 DIAGNOSIS — G620 Drug-induced polyneuropathy: Secondary | ICD-10-CM | POA: Diagnosis not present

## 2017-08-26 DIAGNOSIS — M898X9 Other specified disorders of bone, unspecified site: Secondary | ICD-10-CM

## 2017-08-26 DIAGNOSIS — Z5111 Encounter for antineoplastic chemotherapy: Secondary | ICD-10-CM | POA: Diagnosis not present

## 2017-08-26 LAB — CMP (CANCER CENTER ONLY)
ALK PHOS: 74 U/L (ref 40–150)
ALT: 46 U/L (ref 0–55)
AST: 36 U/L — ABNORMAL HIGH (ref 5–34)
Albumin: 3.5 g/dL (ref 3.5–5.0)
Anion gap: 5 (ref 3–11)
BUN: 7 mg/dL (ref 7–26)
CHLORIDE: 110 mmol/L — AB (ref 98–109)
CO2: 25 mmol/L (ref 22–29)
CREATININE: 0.72 mg/dL (ref 0.60–1.10)
Calcium: 9.1 mg/dL (ref 8.4–10.4)
Glucose, Bld: 124 mg/dL (ref 70–140)
Potassium: 3.6 mmol/L (ref 3.5–5.1)
SODIUM: 140 mmol/L (ref 136–145)
TOTAL PROTEIN: 6.2 g/dL — AB (ref 6.4–8.3)
Total Bilirubin: 0.4 mg/dL (ref 0.2–1.2)

## 2017-08-26 LAB — CBC WITH DIFFERENTIAL (CANCER CENTER ONLY)
BASOS ABS: 0 10*3/uL (ref 0.0–0.1)
Basophils Relative: 1 %
EOS ABS: 0 10*3/uL (ref 0.0–0.5)
Eosinophils Relative: 1 %
HCT: 23.5 % — ABNORMAL LOW (ref 34.8–46.6)
HEMOGLOBIN: 8 g/dL — AB (ref 11.6–15.9)
LYMPHS ABS: 0.5 10*3/uL — AB (ref 0.9–3.3)
Lymphocytes Relative: 30 %
MCH: 32.4 pg (ref 25.1–34.0)
MCHC: 34.1 g/dL (ref 31.5–36.0)
MCV: 95.1 fL (ref 79.5–101.0)
Monocytes Absolute: 0.1 10*3/uL (ref 0.1–0.9)
Monocytes Relative: 8 %
Neutro Abs: 1.1 10*3/uL — ABNORMAL LOW (ref 1.5–6.5)
Neutrophils Relative %: 60 %
Platelet Count: 197 10*3/uL (ref 145–400)
RBC: 2.47 MIL/uL — AB (ref 3.70–5.45)
RDW: 17.6 % — ABNORMAL HIGH (ref 11.2–14.5)
WBC: 1.8 10*3/uL — AB (ref 3.9–10.3)

## 2017-08-26 LAB — IRON AND TIBC
IRON: 68 ug/dL (ref 41–142)
Saturation Ratios: 21 % (ref 21–57)
TIBC: 324 ug/dL (ref 236–444)
UIBC: 255 ug/dL

## 2017-08-26 LAB — FERRITIN: FERRITIN: 167 ng/mL (ref 9–269)

## 2017-08-26 LAB — PREPARE RBC (CROSSMATCH)

## 2017-08-26 MED ORDER — GOSERELIN ACETATE 3.6 MG ~~LOC~~ IMPL
DRUG_IMPLANT | SUBCUTANEOUS | Status: AC
  Filled 2017-08-26: qty 3.6

## 2017-08-26 MED ORDER — DEXAMETHASONE SODIUM PHOSPHATE 10 MG/ML IJ SOLN
INTRAMUSCULAR | Status: AC
  Filled 2017-08-26: qty 1

## 2017-08-26 MED ORDER — CARBOPLATIN CHEMO INJECTION 450 MG/45ML
225.0000 mg | Freq: Once | INTRAVENOUS | Status: AC
  Administered 2017-08-26: 230 mg via INTRAVENOUS
  Filled 2017-08-26: qty 23

## 2017-08-26 MED ORDER — SODIUM CHLORIDE 0.9 % IV SOLN
Freq: Once | INTRAVENOUS | Status: AC
  Administered 2017-08-26: 14:00:00 via INTRAVENOUS

## 2017-08-26 MED ORDER — PACLITAXEL PROTEIN-BOUND CHEMO INJECTION 100 MG
80.0000 mg/m2 | Freq: Once | INTRAVENOUS | Status: AC
  Administered 2017-08-26: 150 mg via INTRAVENOUS
  Filled 2017-08-26: qty 30

## 2017-08-26 MED ORDER — DEXAMETHASONE SODIUM PHOSPHATE 10 MG/ML IJ SOLN
10.0000 mg | Freq: Once | INTRAMUSCULAR | Status: AC
  Administered 2017-08-26: 10 mg via INTRAVENOUS

## 2017-08-26 MED ORDER — SODIUM CHLORIDE 0.9% FLUSH
10.0000 mL | INTRAVENOUS | Status: DC | PRN
  Administered 2017-08-26: 10 mL
  Filled 2017-08-26: qty 10

## 2017-08-26 MED ORDER — PALONOSETRON HCL INJECTION 0.25 MG/5ML
INTRAVENOUS | Status: AC
  Filled 2017-08-26: qty 5

## 2017-08-26 MED ORDER — GOSERELIN ACETATE 3.6 MG ~~LOC~~ IMPL
3.6000 mg | DRUG_IMPLANT | SUBCUTANEOUS | Status: DC
  Administered 2017-08-26: 3.6 mg via SUBCUTANEOUS

## 2017-08-26 MED ORDER — ANTICOAGULANT SODIUM CITRATE 4% (200MG/5ML) IV SOLN
5.0000 mL | Freq: Once | Status: AC
  Administered 2017-08-26: 5 mL via INTRAVENOUS
  Filled 2017-08-26: qty 5

## 2017-08-26 MED ORDER — PALONOSETRON HCL INJECTION 0.25 MG/5ML
0.2500 mg | Freq: Once | INTRAVENOUS | Status: AC
  Administered 2017-08-26: 0.25 mg via INTRAVENOUS

## 2017-08-26 MED ORDER — SODIUM CHLORIDE 0.9% FLUSH
10.0000 mL | Freq: Once | INTRAVENOUS | Status: AC
  Administered 2017-08-26: 10 mL
  Filled 2017-08-26: qty 10

## 2017-08-26 NOTE — Patient Instructions (Signed)

## 2017-08-26 NOTE — Progress Notes (Signed)
Per Cira Rue, NP okay for treatment of Abraxane and Carboplatin today with ANC and Hgb results.

## 2017-08-26 NOTE — Patient Instructions (Signed)
[facility] Discharge Instructions for Patients Receiving Chemotherapy  Today you received the following chemotherapy agents Abraxane and Carboplatin   To help prevent nausea and vomiting after your treatment, we encourage you to take your nausea medication as directed.   If you develop nausea and vomiting that is not controlled by your nausea medication, call the clinic.   BELOW ARE SYMPTOMS THAT SHOULD BE REPORTED IMMEDIATELY:  *FEVER GREATER THAN 100.5 F  *CHILLS WITH OR WITHOUT FEVER  NAUSEA AND VOMITING THAT IS NOT CONTROLLED WITH YOUR NAUSEA MEDICATION  *UNUSUAL SHORTNESS OF BREATH  *UNUSUAL BRUISING OR BLEEDING  TENDERNESS IN MOUTH AND THROAT WITH OR WITHOUT PRESENCE OF ULCERS  *URINARY PROBLEMS  *BOWEL PROBLEMS  UNUSUAL RASH Items with * indicate a potential emergency and should be followed up as soon as possible.  Feel free to call the clinic should you have any questions or concerns. The clinic phone number is [phone].  Please show the CHEMO ALERT CARD at check-in to the Emergency Department and triage nurse.   

## 2017-08-26 NOTE — Progress Notes (Signed)
[place]  [phone] [phone]  Clinic Follow up Note   Patient Care Team: Patient, No Pcp Per as PCP - General ([place]) [name], MD as Consulting Physician (General Surgery) [name], MD as Consulting Physician (Hematology) [name], NP as Nurse Practitioner (Nurse Practitioner) 08/26/2017  SUMMARY OF ONCOLOGIC HISTORY:   Cancer of overlapping sites of left breast ([place])   04/15/2017 Mammogram    IMPRESSION: 1. Highly suspicious irregular mass within the left breast at the 4 o'clock axis, 8 cm from the nipple, measuring 5.2 cm, corresponding to the area of clinical concern. Ultrasound-guided biopsy is recommended. 2. Additional suspicious irregular mass within the left breast at the 2 o'clock axis, 8 cm from the nipple, measuring 1.6 cm, corresponding to the mammographic finding. Ultrasound-guided biopsy is recommended. 3. Additional suspicious mass within the left breast at the 2 o'clock axis, 10 cm from the nipple, axillary tail region, measuring 3 cm, suspected lymph node completely replaced by tumor. Ultrasound-guided biopsy is recommended. 4. No evidence of malignancy within the right breast.       04/15/2017 Breast US    Left breast: Targeted ultrasound is performed, showing an irregular mass in the left breast at the 4 o'clock axis, 8 cm from the nipple, measuring 5.2 x 4.3 x 4.3 cm, with internal vascularity, corresponding to the mammographic finding and palpable lump.  There is an additional irregular hypoechoic mass in the left breast at the 2 o'clock axis, 8 cm from the nipple, measuring 1.6 x 1.3 x 1.3 cm, with internal vascularity, corresponding to the additional mass seen within the outer left breast on mammogram, suspected satellite mass.  Lastly, there is an oval circumscribed hypoechoic mass in the left breast at the 2 o'clock axis, 10 cm from the nipple, axillary tail region, with heterogeneous  echotexture, measuring 3 x 2.3 x 2.8 cm, suspected lymph node completely replaced by tumor.  Remainder of the left axilla was evaluated with ultrasound showing no additional enlarged or morphologically abnormal lymph nodes.  IMPRESSION: 1. Highly suspicious irregular mass within the left breast at the 4 o'clock axis, 8 cm from the nipple, measuring 5.2 cm, corresponding to the area of clinical concern. Ultrasound-guided biopsy is recommended. 2. Additional suspicious irregular mass within the left breast at the 2 o'clock axis, 8 cm from the nipple, measuring 1.6 cm, corresponding to the mammographic finding. Ultrasound-guided biopsy is recommended. 3. Additional suspicious mass within the left breast at the 2 o'clock axis, 10 cm from the nipple, axillary tail region, measuring 3 cm, suspected lymph node completely replaced by tumor. Ultrasound-guided biopsy is recommended. 4. No evidence of malignancy within the right breast.       04/19/2017 Initial Biopsy    Diagnosis 1. Breast, left, needle core biopsy, 4:00 o'clock, ribbon clip - INVASIVE DUCTAL CARCINOMA. - SEE COMMENT. 2. Breast, left, needle core biopsy, 2:00 o'clock, coil clip - INVASIVE DUCTAL CARCINOMA. - SEE COMMENT. 3. Lymph node, needle/core biopsy, left axilla, spiral hydromark - DUCTAL CARCINOMA. - SEE COMMENT.  2. PROGNOSTIC INDICATORS Results: IMMUNOHISTOCHEMICAL AND MORPHOMETRIC ANALYSIS PERFORMED MANUALLY Estrogen Receptor: 0%, NEGATIVE Progesterone Receptor: 0%, NEGATIVE Proliferation Marker Ki67: 80%  2. FLUORESCENCE IN-SITU HYBRIDIZATION Results: HER2 - NEGATIVE RATIO OF HER2/CEP17 SIGNALS 1.53 AVERAGE HER2 COPY NUMBER PER CELL 2.75  Microscopic Comment 1. The carcinoma in the three specimens is morphologically similar and is grade III. Lymph nodal tissue is not definitively identified in specimen #3. A breast prognostic profile will be performed  on part 2 and the results reported separately.       04/28/2017 Initial Diagnosis    Cancer of overlapping sites of left breast ([name])      04/30/2017 Breast MRI    IMPRESSION: 1. Biopsy-proven invasive carcinoma within the lower outer quadrant of the LEFT breast, 4 o'clock axis, at posterior depth, measuring 4.8 cm, with associated biopsy clip artifact. 2. Biopsy-proven invasive carcinoma within the upper-outer quadrant of the LEFT breast, 2 o'clock axis, at posterior depth, measuring 2.5 cm, with associated biopsy clip artifact. However, contiguous non mass enhancement along the posterior margin of this 2 o'clock mass and extending 3 cm anteriorly from the mass increases the overall measurement to 5.5 cm greatest dimension (AP). 3. Diffuse edema throughout the LEFT breast, with particularly prominent component of edema extending posteriorly to abut the anterior surface of the pectoralis muscle, and diffuse skin thickening throughout the left breast. This almost certainly indicates INFLAMMATORY BREAST CANCER. 4. Biopsy-proven metastatic lymph node within the LEFT axilla measures 4.1 cm. Two additional borderline prominent lymph nodes within the left axilla. No enlarged lymph nodes within the right axilla or internal mammary chain regions. 5. No evidence of malignancy within the RIGHT breast.  RECOMMENDATION: Per current treatment plan for patient's known left breast cancer (2 biopsy-proven sites) and metastatic lymph node in the left axilla.  BI-RADS CATEGORY  6: Known biopsy-proven malignancy.       04/30/2017 Echocardiogram    Study Conclusions  - Left ventricle: The cavity size was normal. Wall thickness was   normal. Systolic function was normal. The estimated ejection   fraction was in the range of 60% to 65%. Wall motion was normal;   there were no regional wall motion abnormalities. Left   ventricular diastolic function parameters were normal. GLS:   -20.4%, RV S vel 12.1cm/s.      05/05/2017 Imaging    MRI BRAIN  IMPRESSION: No intracranial or calvarial metastatic disease. Normal MRI of the brain.      05/07/2017 Imaging    CT CAP IMPRESSION: Two left breast masses and a grossly abnormal 3.9 cm lower left axillary lymph node, likely correspond to multifocal left breast cancer metastatic to the left axilla. Skin thickening of the left breast, raising concern for an inflammatory breast cancer.  1 cm hypoattenuated lesion in the posterior dome of the liver with internal attenuation slightly higher than water. This may represent a complicated liver cyst, hemangioma or a focus of metastatic disease.  No other findings to suggest distal metastatic disease.       05/07/2017 Imaging    BONE SCAN IMPRESSION: 1. No evidence of osseous metastases. 2. Abnormal uptake in the soft tissues of the left breast likely by the patient's known breast cancer.       05/11/2017 Genetic Testing    Common Cancers panel (47 genes) @ [facility] - No pathoagenic mutations detected Five Variants of Uncertain Significance were detected:  APC c.2438A>G (p.Asn813Ser)  BARD1 c.782T>C (p.Leu261Pro) MSH3 c.1655C>T (p.Thr552Ile)  MSH6 c.2108T>C (p.Met703Thr)  NTHL1 c.470G>C (p.Arg157Pro)   Genes Analyzed: 47 genes on [facility]'s Common Cancers panel (APC, ATM, AXIN2, BARD1, BMPR1A, BRCA1, BRCA2, BRIP1, CDH1, CDK4, CDKN2A, CHEK2, CTNNA1, DICER1, EPCAM, GREM1, HOXB13, KIT, MEN1, MLH1, MSH2, MSH3, MSH6, MUTYH, NBN, NF1, NTHL1, PALB2, PDGFRA, PMS2, POLD1, POLE, PTEN, RAD50, RAD51C, RAD51D, SDHA, SDHB, SDHC, SDHD, SMAD4, SMARCA4, STK11, TP53, TSC1, TSC2, VHL).       05/12/2017 -  Chemotherapy    1. Neoadjuvant AC q2 weeks x4  cycles then weekly 05/12/17 - 06/24/17 followed by carbo/taxol for 12 weeks starting 07/08/17. Due to poor toleration, changed Taxol to abraxane and added Carboplatin with cycle 2.  2. Monthly zoladex injections starting 04/30/17 for the duration of chemotherapy.       08/06/2017 Mammogram    The previously  demonstrated rounded mass in the 2 o'clock position of the left breast, containing a coil shaped biopsy marker clip, is also significantly smaller, less well-defined and has interspersed fat. This currently measures 0.7 x 0.5 x 0.4 cm in maximum dimensions, previously 1.8 x 1.5 x 1.3 cm mammographically.  IMPRESSION: 1. No evidence of malignancy at the location of recent palpable concern in the upper outer left breast. 2. Marked decrease in size and conspicuity of the biopsy-proven invasive ductal carcinomas in the 2 o'clock and 4 o'clock positions of the left breast with no discrete residual masses. There is only mild ill-defined residual soft tissue density and interspersed fat at those locations and associated architectural distortion in the 4 o'clock position. 3. Complete imaging resolution of the previously biopsied metastatic left inferior axillary lymph node.     CURRENT THERAPY:  1. Neoadjuvant AC q2 weeks x4 cycles then weekly1/23/19 - 3/7/19followed by carbo/taxol for 12 weeks starting 07/08/17. Due to poor toleration, changed Taxol to abraxane and added Carboplatin with cycle 2. 2. Monthly zoladex injections starting 04/30/17 for the duration of chemotherapy.    INTERVAL HISTORY: Carrie Miller returns for follow up as scheduled. She completed cycle 7 on 08/19/17. Her body pain is improved. Tylenol #3 caused GI upset so she discontinued. Has not tried cymbalta. Pain is controlled with ibuprofen. Nail pain improved with soaks. She has persistent fatigue but still able to leave the house and enjoy time with family. She had dizziness today but infrequently over the week. No recent fever or chills. Good appetite. No nausea, vomiting, constipation, or diarrhea. Mild neuropathy is stable.   REVIEW OF SYSTEMS:   Constitutional: Denies fevers, chills or abnormal weight loss (+) persistent fatigue  Eyes: Denies blurriness of vision Ears, nose, mouth, throat, and face: Denies mucositis or sore  throat Respiratory: Denies cough, dyspnea or wheezes Cardiovascular: Denies palpitation, chest discomfort or lower extremity swelling Gastrointestinal:  Denies nausea, vomiting, constipation, diarrhea, heartburn or change in bowel habits Skin: Denies abnormal skin rashes (+) nail darkening and pain, improved  Lymphatics: Denies new lymphadenopathy or easy bruising Neurological:Denies numbness, tingling or new weaknesses Behavioral/Psych: Mood is stable, no new changes (+) mild anxiety MSK: generalized body pain, improved  All other systems were reviewed with the patient and are negative.  MEDICAL HISTORY:  Past Medical History:  Diagnosis Date  . Breast cancer ([place]) left  . Family history of cancer   . Genetic testing 04/29/2017   Common Cancers panel (47 genes) @ [facility] - No pathoagenic mutations detected    SURGICAL HISTORY: Past Surgical History:  Procedure Laterality Date  . IR FLUORO GUIDE PORT INSERTION RIGHT  05/03/2017  . IR US GUIDE VASC ACCESS RIGHT  05/03/2017    I have reviewed the social history and family history with the patient and they are unchanged from previous note.  ALLERGIES:  is allergic to heparin.  MEDICATIONS:  Current Outpatient Medications  Medication Sig Dispense Refill  . acetaminophen-codeine (TYLENOL #3) 300-30 MG tablet Take 1 tablet by mouth every 6 (six) hours as needed for moderate pain. 30 tablet 0  . ALPRAZolam (XANAX) 0.25 MG tablet Take 1 tablet (0.25 mg total) by mouth at  bedtime as needed for anxiety. 30 tablet 0  . DULoxetine (CYMBALTA) 20 MG capsule Take 1 capsule (20 mg total) by mouth daily. 30 capsule 0  . ibuprofen (ADVIL,MOTRIN) 200 MG tablet Take 2 tablets (400 mg total) by mouth every 6 (six) hours as needed. 30 tablet 0  . lidocaine-prilocaine (EMLA) cream Apply to affected area once 30 g 3  . LORazepam (ATIVAN) 0.5 MG tablet Take 1 tablet (0.5 mg total) by mouth every 8 (eight) hours as needed (nausea, vomiting). 20 tablet 0   . ondansetron (ZOFRAN) 8 MG tablet Take 1 tablet (8 mg total) by mouth 2 (two) times daily as needed. Start on the third day after chemotherapy. 30 tablet 1  . prochlorperazine (COMPAZINE) 10 MG tablet Take 1 tablet (10 mg total) by mouth every 6 (six) hours as needed (Nausea or vomiting). 30 tablet 1   No current facility-administered medications for this visit.    Facility-Administered Medications Ordered in Other Visits  Medication Dose Route Frequency Provider Last Rate Last Dose  . anticoagulant sodium citrate solution 5 mL  5 mL Intravenous Once [name], MD      . goserelin (ZOLADEX) injection 3.6 mg  3.6 mg Subcutaneous Q28 days [name], NP      . sodium chloride flush (NS) 0.9 % injection 10 mL  10 mL Intracatheter PRN [name], MD        PHYSICAL EXAMINATION: ECOG PERFORMANCE STATUS: 1 - Symptomatic but completely ambulatory  Vitals:   08/26/17 1307  BP: 122/77  Pulse: 97  Resp: 18  Temp: 98 F (36.7 C)  SpO2: 100%   Filed Weights   08/26/17 1307  Weight: 210 lb 8 oz (95.5 kg)    GENERAL:alert, no distress and comfortable SKIN: skin color, texture, turgor are normal, no rashes or significant lesions EYES: normal, Conjunctiva are pink and non-injected, sclera clear OROPHARYNX:no exudate, no erythema; hyperpigmented tongue  LYMPH:  no palpable cervical, supraclavicular, and axillary lymphadenopathy LUNGS: clear to auscultation with normal breathing effort HEART: regular rate & rhythm and no murmurs and no lower extremity edema ABDOMEN:abdomen soft, non-tender and normal bowel sounds Musculoskeletal: no cyanosis of digits and no clubbing  NEURO: alert & oriented x 3 with fluent speech, no focal motor/sensory deficits Breasts:  PAC without erythema   LABORATORY DATA:  I have reviewed the data as listed CBC Latest Ref Rng & Units 08/26/2017 08/19/2017 08/12/2017  WBC 3.9 - 10.3 K/uL 1.8(L) 2.8(L) 2.7(L)  Hemoglobin 11.6 - 15.9 g/dL 8.0(L) 8.7(L) 8.7(L)   Hematocrit 34.8 - 46.6 % 23.5(L) 25.8(L) 25.8(L)  Platelets 145 - 400 K/uL 197 207 207     CMP Latest Ref Rng & Units 08/26/2017 08/19/2017 08/12/2017  Glucose 70 - 140 mg/dL 124 105 108  BUN 7 - 26 mg/dL _0 Creatinine 0.60 - 1.10 mg/dL 0.72 0.66 0.66  Sodium 136 - 145 mmol/L 140 139 139  Potassium 3.5 - 5.1 mmol/L 3.6 4.0 3.9  Chloride 98 - 109 mmol/L 110(H) 109 108  CO2 22 - 29 mmol/L _1 Calcium 8.4 - 10.4 mg/dL 9.1 9.4 9.3  Total Protein 6.4 - 8.3 g/dL 6.2(L) 6.7 6.5  Total Bilirubin 0.2 - 1.2 mg/dL 0.4 0.4 0.6  Alkaline Phos 40 - 150 U/L 74 82 76  AST 5 - 34 U/L 36(H) 32 38(H)  ALT 0 - 55 U/L 46 45 58(H)   PATHOLOGY  Diagnosis 05/10/17 1. Cervix, biopsy, 11 o'clock - ATYPICAL SQUAMOUS  METAPLASIA ASSOCIATED WITH INFLAMMATION. - SEE COMMENT. 2. Endocervix, curettage - DETACHED FRAGMENTS OF SQUAMOUS MUCOSA WITH SLIGHT ATYPIA. - BENIGN ENDOCERVICAL MUCOSA. - SEE COMMENT. Microscopic Comment 1. The sections show multiple fragments of mostly metaplastic squamous mucosa displaying prominent chronic and acute inflammation to a lesser extent. This is associated with epithelial atypia in the form of nuclear enlargement, and small nucleoli. The inflammatory process obscures the epithelium which is somewhat difficult to evaluate. A p16 stain was performed and is negative. Overall, definitive or diagnostic squamous intraepithelial lesion is not appreciated in this setting, and the changes may represent a florid reactive state. Nonetheless, clinical correlation and follow up is strongly recommended. 2. The sections show multiple detached fragments of squamous mucosa, some of which display slight atypia. This is admixed with multiple fragments of benign endocervical mucosa. A p16 stain was performed and is negative. The overall changes are not specific or diagnostic of a squamous intraepithelial lesion. Clinical correlation and follow up is recommended. (BNS:ah  05/12/17)   Diagnosis 04/19/17 1. Breast, left, needle core biopsy, 4:00 o'clock, ribbon clip - INVASIVE DUCTAL CARCINOMA. - SEE COMMENT. 2. Breast, left, needle core biopsy, 2:00 o'clock, coil clip - INVASIVE DUCTAL CARCINOMA. - SEE COMMENT. 3. Lymph node, needle/core biopsy, left axilla, spiral hydromark - DUCTAL CARCINOMA. - SEE COMMENT. Microscopic Comment 1. The carcinoma in the three specimens is morphologically similar and is grade III. Lymph nodal tissue is not definitively identified in specimen #3. A breast prognostic profile will be performed on part 2 and the results reported separately. 2. FLUORESCENCE IN-SITU HYBRIDIZATION Results: HER2 - NEGATIVE RATIO OF HER2/CEP17 SIGNALS 1.53 AVERAGE HER2 COPY NUMBER PER CELL 2.75 2. PROGNOSTIC INDICATORS Results: IMMUNOHISTOCHEMICAL AND MORPHOMETRIC ANALYSIS PERFORMED MANUALLY Estrogen Receptor: 0%, NEGATIVE Progesterone Receptor: 0%, NEGATIVE Proliferation Marker Ki67: 80% COMMENT: The negative hormone receptor study(ies) in this case has An internal positive control.    RADIOGRAPHIC STUDIES: I have personally reviewed the radiological images as listed and agreed with the findings in the report. No results found.   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 She completed cycle 7 Abraxane/carboplatin on 08/19/2017.  She is tolerating treatment moderately well overall.  She developed worsening cytopenias, ANC 1.1, Hgb 8.0.  Labs otherwise adequate for treatment.  Proceed with cycle 8 chemotherapy today, Abraxane at current dose, will dose  reduce carboplatin.  Plan to give 2 units RBCs in the next few days symptomatic anemia.  -Follow-up in 1 week with cycle 9 -We will go ahead and order breast MRI to be done at the completion of chemotherapy, expect ~09/27/2017  2. Diffuse, generalized body/bone pain -Overall improved from last cycle.  She had GI upset with Tylenol #3, has not started Cymbalta.  Pain is overall well managed with NSAIDs. Will monitor.  3. Headaches, dizziness -He has intermittent dizziness, experienced an episode today.  Previous brain MRI negative for metastasis.  Etiology unclear but could be related to worsening anemia.  Will transfuse 2 units for symptomatic anemia 4. Anxiety -Mood is stable, controlled with Xanax as needed  5. Social issues -Followed by social work, Hydrographic surveyor, and chaplain  6. Genetics - negative for pathogenic mutation 05/11/17, VUS in [place], BARD1,  MSH3, MSH6, and NTHL1  7. Fertility planning  -Continues monthly Zoladex for duration of chemotherapy, last dose 07/22/2017.  She is due today.  8. Irregular vaginal bleeding -Followed by GYN  9. 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 Chemotherapy-induced anemia, neutropenia  -She is symptomatic with fatigue and intermittent dizziness.  -Iron studies are normal. She does not need oral or IV iron supplement -She consented to RBC transfusion today after careful discussion of cultural/religious considerations, risks, and benefits. Will arrange 2 units RBC's in next few days -ANC 1.1; we reviewed neutropenic precautions -Will dose-reduce carboplatin for cytopenias   PLAN: -Labs reviewed, OK to proceed with cycle 8 abraxane/carboplatin, dose-reduced carboplatin  -Zoladex today, monthly while on chemotherapy  -2 units RBC transfusion 08/28/17 for symptomatic anemia  -F/u in 1 week with cycle 9  -MRI breast after completion of chemotherapy,  ordered today; expected ~09/27/17   Orders Placed This Encounter  Procedures  . MR BREAST BILATERAL W WO CONTRAST INC CAD    Standing Status:   Future    Standing Expiration Date:   10/27/2018    Order Specific Question:   If indicated for the ordered procedure, I authorize the administration of contrast media per Radiology protocol    Answer:   Yes    Order Specific Question:   What is the patient's sedation requirement?    Answer:   No Sedation    Order Specific Question:   Does the patient have a pacemaker or implanted devices?    Answer:   No    Order Specific Question:   Radiology Contrast Protocol - do NOT remove file path    Answer:   [email or web address]    Order Specific Question:   Reason for Exam additional comments    Answer:   Restage left breast cancer s/p completion of neoadjuvant chemotherapy, for surgical planning    Order Specific Question:   Preferred imaging location?    Answer:   [facility] (table limit-350 lbs)  . Iron and TIBC    Standing Status:   Future    Number of Occurrences:   1    Standing Expiration Date:   08/27/2018  . Ferritin    Standing Status:   Future    Number of Occurrences:   1    Standing Expiration Date:   08/27/2018  . Practitioner attestation of consent    I, the ordering practitioner, attest that I have discussed with the patient the benefits, risks, side effects, alternatives, likelihood of achieving goals and potential problems during recovery for the procedure listed.    Standing Status:   Future    Standing Expiration Date:   08/26/2018    Order Specific Question:   Procedure    Answer:   Blood Product(s)  . Complete patient signature process for consent form    Standing Status:   Future    Standing Expiration Date:   08/26/2018  . Care order/instruction    Transfuse Parameters    Standing Status:   Future    Standing Expiration Date:   08/26/2018  . Type and screen    Standing Status:   Future    Number  of Occurrences:   1    Standing Expiration Date:   08/27/2018   All questions were answered. The patient knows to call the clinic with any problems, questions or concerns. No barriers to learning was detected.  I spent 20 minutes counseling the patient face to face. The total time spent in the appointment was 25 minutes and more than 50% was on counseling and review of test results     [name], NP 08/26/17

## 2017-08-27 LAB — ABO/RH: ABO/RH(D): A POS

## 2017-08-27 LAB — CANCER ANTIGEN 27.29: CAN 27.29: 18.3 U/mL (ref 0.0–38.6)

## 2017-08-28 ENCOUNTER — Inpatient Hospital Stay: Payer: Medicaid Other

## 2017-08-28 VITALS — BP 106/78 | HR 82 | Temp 97.9°F | Resp 18 | Ht 64.0 in

## 2017-08-28 DIAGNOSIS — Z5111 Encounter for antineoplastic chemotherapy: Secondary | ICD-10-CM | POA: Diagnosis not present

## 2017-08-28 DIAGNOSIS — Z95828 Presence of other vascular implants and grafts: Secondary | ICD-10-CM

## 2017-08-28 DIAGNOSIS — D6481 Anemia due to antineoplastic chemotherapy: Secondary | ICD-10-CM

## 2017-08-28 DIAGNOSIS — T451X5A Adverse effect of antineoplastic and immunosuppressive drugs, initial encounter: Principal | ICD-10-CM

## 2017-08-28 DIAGNOSIS — C50812 Malignant neoplasm of overlapping sites of left female breast: Secondary | ICD-10-CM

## 2017-08-28 MED ORDER — ANTICOAGULANT SODIUM CITRATE 4% (200MG/5ML) IV SOLN: 5.0000 mL | Freq: Once | Status: DC

## 2017-08-28 MED ORDER — SODIUM CHLORIDE 0.9 % IV SOLN: 250.0000 mL | Freq: Once | INTRAVENOUS | Status: DC

## 2017-08-28 MED ORDER — SODIUM CHLORIDE 0.9% FLUSH
10.0000 mL | INTRAVENOUS | Status: DC | PRN
  Filled 2017-08-28: qty 10

## 2017-08-28 NOTE — Progress Notes (Signed)
No sodium citrate available at this time.  Discussed with pt, IV placed instead of port accessed.

## 2017-08-28 NOTE — Patient Instructions (Signed)

## 2017-08-29 LAB — BPAM RBC
Blood Product Expiration Date: 201906012359
Blood Product Expiration Date: 201906012359
ISSUE DATE / TIME: 201905111002
ISSUE DATE / TIME: 201905111002
UNIT TYPE AND RH: 6200
Unit Type and Rh: 6200

## 2017-08-29 LAB — TYPE AND SCREEN
ABO/RH(D): A POS
Antibody Screen: NEGATIVE
UNIT DIVISION: 0
Unit division: 0

## 2017-09-01 DIAGNOSIS — G620 Drug-induced polyneuropathy: Secondary | ICD-10-CM | POA: Insufficient documentation

## 2017-09-01 DIAGNOSIS — D6481 Anemia due to antineoplastic chemotherapy: Secondary | ICD-10-CM | POA: Insufficient documentation

## 2017-09-01 DIAGNOSIS — T451X5A Adverse effect of antineoplastic and immunosuppressive drugs, initial encounter: Secondary | ICD-10-CM

## 2017-09-01 NOTE — Progress Notes (Signed)
[place]  [phone] [phone]  Clinic Follow Up Note   Patient Care Team: Patient, No Pcp Per as PCP - General ([facility] [place]) [name], MD as Consulting Physician (General Surgery) [name], MD as Consulting Physician (Hematology) [name], NP as Nurse Practitioner (Nurse Practitioner)   Date of Service:  09/02/2017  CHIEF COMPLAINTS:  Follow up left breast cancer, triple negative     Cancer of overlapping sites of left breast ([place])   04/15/2017 Mammogram    IMPRESSION: 1. Highly suspicious irregular mass within the left breast at the 4 o'clock axis, 8 cm from the nipple, measuring 5.2 cm, corresponding to the area of clinical concern. Ultrasound-guided biopsy is recommended. 2. Additional suspicious irregular mass within the left breast at the 2 o'clock axis, 8 cm from the nipple, measuring 1.6 cm, corresponding to the mammographic finding. Ultrasound-guided biopsy is recommended. 3. Additional suspicious mass within the left breast at the 2 o'clock axis, 10 cm from the nipple, axillary tail region, measuring 3 cm, suspected lymph node completely replaced by tumor. Ultrasound-guided biopsy is recommended. 4. No evidence of malignancy within the right breast.       04/15/2017 Breast US    Left breast: Targeted ultrasound is performed, showing an irregular mass in the left breast at the 4 o'clock axis, 8 cm from the nipple, measuring 5.2 x 4.3 x 4.3 cm, with internal vascularity, corresponding to the mammographic finding and palpable lump.  There is an additional irregular hypoechoic mass in the left breast at the 2 o'clock axis, 8 cm from the nipple, measuring 1.6 x 1.3 x 1.3 cm, with internal vascularity, corresponding to the additional mass seen within the outer left breast on mammogram, suspected satellite mass.  Lastly, there is an oval circumscribed hypoechoic mass in the left breast at the 2 o'clock axis, 10 cm from  the nipple, axillary tail region, with heterogeneous echotexture, measuring 3 x 2.3 x 2.8 cm, suspected lymph node completely replaced by tumor.  Remainder of the left axilla was evaluated with ultrasound showing no additional enlarged or morphologically abnormal lymph nodes.  IMPRESSION: 1. Highly suspicious irregular mass within the left breast at the 4 o'clock axis, 8 cm from the nipple, measuring 5.2 cm, corresponding to the area of clinical concern. Ultrasound-guided biopsy is recommended. 2. Additional suspicious irregular mass within the left breast at the 2 o'clock axis, 8 cm from the nipple, measuring 1.6 cm, corresponding to the mammographic finding. Ultrasound-guided biopsy is recommended. 3. Additional suspicious mass within the left breast at the 2 o'clock axis, 10 cm from the nipple, axillary tail region, measuring 3 cm, suspected lymph node completely replaced by tumor. Ultrasound-guided biopsy is recommended. 4. No evidence of malignancy within the right breast.       04/19/2017 Initial Biopsy    Diagnosis 1. Breast, left, needle core biopsy, 4:00 o'clock, ribbon clip - INVASIVE DUCTAL CARCINOMA. - SEE COMMENT. 2. Breast, left, needle core biopsy, 2:00 o'clock, coil clip - INVASIVE DUCTAL CARCINOMA. - SEE COMMENT. 3. Lymph node, needle/core biopsy, left axilla, spiral hydromark - DUCTAL CARCINOMA. - SEE COMMENT.  2. PROGNOSTIC INDICATORS Results: IMMUNOHISTOCHEMICAL AND MORPHOMETRIC ANALYSIS PERFORMED MANUALLY Estrogen Receptor: 0%, NEGATIVE Progesterone Receptor: 0%, NEGATIVE Proliferation Marker Ki67: 80%  2. FLUORESCENCE IN-SITU HYBRIDIZATION Results: HER2 - NEGATIVE RATIO OF HER2/CEP17 SIGNALS 1.53 AVERAGE HER2 COPY NUMBER PER CELL 2.75  Microscopic Comment 1. The carcinoma in the three specimens is morphologically similar and is grade III. Lymph nodal tissue  is not definitively identified in specimen #3. A breast prognostic profile will be performed on  part 2 and the results reported separately.      04/28/2017 Initial Diagnosis    Cancer of overlapping sites of left breast ([place])      04/30/2017 Breast MRI    IMPRESSION: 1. Biopsy-proven invasive carcinoma within the lower outer quadrant of the LEFT breast, 4 o'clock axis, at posterior depth, measuring 4.8 cm, with associated biopsy clip artifact. 2. Biopsy-proven invasive carcinoma within the upper-outer quadrant of the LEFT breast, 2 o'clock axis, at posterior depth, measuring 2.5 cm, with associated biopsy clip artifact. However, contiguous non mass enhancement along the posterior margin of this 2 o'clock mass and extending 3 cm anteriorly from the mass increases the overall measurement to 5.5 cm greatest dimension (AP). 3. Diffuse edema throughout the LEFT breast, with particularly prominent component of edema extending posteriorly to abut the anterior surface of the pectoralis muscle, and diffuse skin thickening throughout the left breast. This almost certainly indicates INFLAMMATORY BREAST CANCER. 4. Biopsy-proven metastatic lymph node within the LEFT axilla measures 4.1 cm. Two additional borderline prominent lymph nodes within the left axilla. No enlarged lymph nodes within the right axilla or internal mammary chain regions. 5. No evidence of malignancy within the RIGHT breast.  RECOMMENDATION: Per current treatment plan for patient's known left breast cancer (2 biopsy-proven sites) and metastatic lymph node in the left axilla.  BI-RADS CATEGORY  6: Known biopsy-proven malignancy.       04/30/2017 Echocardiogram    Study Conclusions  - Left ventricle: The cavity size was normal. Wall thickness was   normal. Systolic function was normal. The estimated ejection   fraction was in the range of 60% to 65%. Wall motion was normal;   there were no regional wall motion abnormalities. Left   ventricular diastolic function parameters were normal. GLS:   -20.4%, RV S vel  12.1cm/s.      05/05/2017 Imaging    MRI BRAIN IMPRESSION: No intracranial or calvarial metastatic disease. Normal MRI of the brain.      05/07/2017 Imaging    CT CAP IMPRESSION: Two left breast masses and a grossly abnormal 3.9 cm lower left axillary lymph node, likely correspond to multifocal left breast cancer metastatic to the left axilla. Skin thickening of the left breast, raising concern for an inflammatory breast cancer.  1 cm hypoattenuated lesion in the posterior dome of the liver with internal attenuation slightly higher than water. This may represent a complicated liver cyst, hemangioma or a focus of metastatic disease.  No other findings to suggest distal metastatic disease.       05/07/2017 Imaging    BONE SCAN IMPRESSION: 1. No evidence of osseous metastases. 2. Abnormal uptake in the soft tissues of the left breast likely by the patient's known breast cancer.       05/11/2017 Genetic Testing    Common Cancers panel (47 genes) @ [facility] - No pathoagenic mutations detected Five Variants of Uncertain Significance were detected:  APC c.2438A>G (p.Asn813Ser)  BARD1 c.782T>C (p.Leu261Pro) MSH3 c.1655C>T (p.Thr552Ile)  MSH6 c.2108T>C (p.Met703Thr)  NTHL1 c.470G>C (p.Arg157Pro)   Genes Analyzed: 47 genes on [facility]'s Common Cancers panel (APC, ATM, AXIN2, BARD1, BMPR1A, BRCA1, BRCA2, BRIP1, CDH1, CDK4, CDKN2A, CHEK2, CTNNA1, DICER1, EPCAM, GREM1, HOXB13, KIT, MEN1, MLH1, MSH2, MSH3, MSH6, MUTYH, NBN, NF1, NTHL1, PALB2, PDGFRA, PMS2, POLD1, POLE, PTEN, RAD50, RAD51C, RAD51D, SDHA, SDHB, SDHC, SDHD, SMAD4, SMARCA4, STK11, TP53, TSC1, TSC2, VHL).  05/12/2017 -  Chemotherapy    1. Neoadjuvant AC q2 weeks x4 cycles then weekly 05/12/17 - 06/24/17 followed by carbo/taxol for 12 weeks starting 07/08/17. Due to poor toleration, changed Taxol to abraxane and added Carboplatin with cycle 2.  2. Monthly zoladex injections starting 04/30/17 for the duration of chemotherapy.        08/06/2017 Mammogram    The previously demonstrated rounded mass in the 2 o'clock position of the left breast, containing a coil shaped biopsy marker clip, is also significantly smaller, less well-defined and has interspersed fat. This currently measures 0.7 x 0.5 x 0.4 cm in maximum dimensions, previously 1.8 x 1.5 x 1.3 cm mammographically.  IMPRESSION: 1. No evidence of malignancy at the location of recent palpable concern in the upper outer left breast. 2. Marked decrease in size and conspicuity of the biopsy-proven invasive ductal carcinomas in the 2 o'clock and 4 o'clock positions of the left breast with no discrete residual masses. There is only mild ill-defined residual soft tissue density and interspersed fat at those locations and associated architectural distortion in the 4 o'clock position. 3. Complete imaging resolution of the previously biopsied metastatic left inferior axillary lymph node.       HISTORY OF PRESENTING ILLNESS: 04/28/17 Carrie Miller 22 y.o. female is here because of newly diagnosed left breast cancer. She was referred by Dr. Barry Dienes. She presents with her brother and his wife; she speaks minimal [place], her family interpreted for her as she refused interpreter services. She initially palpated a left breast mass 6 weeks ago that was painful with breast swelling and felt to be enlarging. Has not had prior mammogram. Denies nipple inversion, discharge, or skin dimpling. She went to [facility] urgent care and was then referred to the breast center for imaging and diagnostics. Diagnostic mammogram showed a dominant mass in the left breast at the 4 o'clock axis, 8 cm from the nipple, measuring 5.2 x 4.3 x 4.3 cm; a satellite mass in the left breast at the 2 o'clock axis, 8 cm from the nipple, measuring 1.6 x 1.3 x 1.3 cm; and a circumscribed hypoechoic mass in the left breast at the 2 o'clock axis, 10 cm from the nipple, axillary tail measuring 3 x 2.3 x 2.8 cm, suspected  lymph node completely replaced by tumor.  Ultrasound-guided biopsy of all 3 masses were positive for invasive ductal carcinoma, ER/PR negative, HER-2 negative, grade 3.    In addition she reports 1 month history of frequent daily headaches with associated dizziness and occasional left eye pain. Pain often wakes her up from sleep, has tried ibuprofen with some relief. Pain usually at the crown of her head, average 5/10 - 9/10 on pain scale. Not sensitive to light or sound. Denies fall. Reports fatigue for 1 week, denies weight loss, decreased appetite, abdominal pain. Over last 2 months she has irregular vaginal bleeding, bleeding 20 days out of the last month. Recently had abnormal PAP smear, colposcopy is pending. She feels very anxious about her diagnosis.  She has no significant past medical history. Her father has prostate cancer diagnosed at age 68. Paternal aunt had "abdominal cancer." a paternal uncle also had prostate cancer diagnosed age 43. Negative family history of breast or GYN cancer. She lives with her family in a home with her parents, brother, and his brother's wife. She does not work or drive. She lived in [place] and moved to [place] 9 months ago. She has permanent resident card, no health insurance. Has been  getting assistance with BCCCP program through [employer].  GYN HISTORY  Menarchal: 7 LMP: currently menstruating  Contraceptive: none HRT: none GP: G0    CURRENT THERAPY:  1. Neoadjuvant AC q2 weeks x4 cycles then weekly 05/12/17 - 06/24/17 followed by carbo/taxol for 12 weeks starting 07/08/17. Due to poor toleration, changed Taxol to abraxane and added Carboplatin with cycle 2.  2. Monthly zoladex injections starting 04/30/17 for the duration of chemotherapy.   INTERVAL HISTORY:  Carrie Miller is here for a follow up and cycle 9 Taxol/Carbo. She presents to the clinic today accompanied by her brother who helps translate for her. She notes that she did well  following her blood transfusion, however she notes that she has dizziness and her head feels heavy. She denies falling or losing balance. She uses an ice bath during her chemotherapy for her bilateral hands and she uses an ice bath to her toes as well.   Labs reviewed today, 09/02/2017 show CBC at WBC at 1.9, RDW at 16.1, Neutro at 0.9, and lymphs at 0.8 and CMP PENDING.   Due to the [patient with decreased WBCs, I will only give abraxin today. .   Since her last visit to the office, she had a diagnostic left mammogram with ultrasonography on 08/06/2017 with results showing: Breast density category B. No evidence of malignancy at the location of recent palpable concern in the upper outer left breast. Marked decrease in size and conspicuity of the biopsy-proven invasive ductal carcinomas in the 2 o'clock and 4 o'clock positions of the left breast with no discrete residual masses. There is only mild ill-defined residual soft tissue density and interspersed fat at those locations and associated architectural distortion in the 4 o'clock position. Complete imaging resolution of the previously biopsied metastatic left inferior axillary lymph node.  On review of systems, she reports decreased energy levels, black coloration to nails, excess sweating. She denies having a menstrual cycle since the start of chemotherapy. she denies decreased appetite, diarrhea, constipation, numbness/tingling, and any other symptoms. Pertinent positives are listed and detailed within the above HPI.   MEDICAL HISTORY:  Past Medical History:  Diagnosis Date  . Breast cancer ([place]) left  . Family history of cancer   . Genetic testing 04/29/2017   Common Cancers panel (47 genes) @ [facility] - No pathoagenic mutations detected    SURGICAL HISTORY: Past Surgical History:  Procedure Laterality Date  . IR FLUORO GUIDE PORT INSERTION RIGHT  05/03/2017  . IR US GUIDE VASC ACCESS RIGHT  05/03/2017    SOCIAL HISTORY: Social History     Socioeconomic History  . Marital status: Single    Spouse name: Not on file  . Number of children: Not on file  . Years of education: Not on file  . Highest education level: Not on file  Occupational History  . 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 Financial resource strain: Not on file  . Food insecurity:    Worry: Not on file    Inability: Not on file  . Transportation needs:    Medical: Not on file    Non-medical: Not on file  Tobacco Use  . Smoking status: Never Smoker  . Smokeless tobacco: Never Used  Substance and Sexual Activity  . Alcohol use: No    Frequency: Never  . Drug use: No  . Sexual activity: Never    Birth control/protection: None  Lifestyle  . Physical activity:    Days per week: 3 days  Minutes per session: 30 min  . Stress: Not at all  Relationships  . Social connections:    Talks on phone: More than three times a week    Gets together: More than three times a week    Attends religious service: 1 to 4 times per year    Active member of club or organization: No    Attends meetings of clubs or organizations: Never    Relationship status: Never married  . Intimate partner violence:    Fear of current or ex partner: No    Emotionally abused: No    Physically abused: No    Forced sexual activity: No  Other Topics Concern  . Not on file  Social History Narrative  . Not on file    FAMILY HISTORY: Family History  Problem Relation Age of Onset  . Hyperlipidemia Mother   . Hypertension Mother   . Prostate cancer Father 35       currently 42  . Stomach cancer Paternal Aunt 67       deceased 24s  . Prostate cancer Paternal Uncle 18       deceased 50    ALLERGIES:  is allergic to heparin.  MEDICATIONS:  Current Outpatient Medications  Medication Sig Dispense Refill  . acetaminophen-codeine (TYLENOL #3) 300-30 MG tablet Take 1 tablet by mouth every 6 (six) hours as needed for moderate pain. 30 tablet 0  . ALPRAZolam (XANAX) 0.25 MG  tablet Take 1 tablet (0.25 mg total) by mouth at bedtime as needed for anxiety. 30 tablet 0  . DULoxetine (CYMBALTA) 20 MG capsule Take 1 capsule (20 mg total) by mouth daily. 30 capsule 0  . ibuprofen (ADVIL,MOTRIN) 200 MG tablet Take 2 tablets (400 mg total) by mouth every 6 (six) hours as needed. 30 tablet 0  . lidocaine-prilocaine (EMLA) cream Apply to affected area once 30 g 3  . LORazepam (ATIVAN) 0.5 MG tablet Take 1 tablet (0.5 mg total) by mouth every 8 (eight) hours as needed (nausea, vomiting). 20 tablet 0  . ondansetron (ZOFRAN) 8 MG tablet Take 1 tablet (8 mg total) by mouth 2 (two) times daily as needed. Start on the third day after chemotherapy. 30 tablet 1  . prochlorperazine (COMPAZINE) 10 MG tablet Take 1 tablet (10 mg total) by mouth every 6 (six) hours as needed (Nausea or vomiting). 30 tablet 1   No current facility-administered medications for this visit.     REVIEW OF SYSTEMS:   Constitutional: Denies fevers, chills or abnormal night sweats (+) increased fatigue (+) headaches, dizziness (+) hot flashes Eyes: Denies blurriness of vision, double vision or watery eyes Ears, nose, mouth, throat, and face: Denies mucositis or sore throat  (+) tongue discoloration with sensitivity.  Respiratory: Denies cough, dyspnea or wheezes Cardiovascular: Denies palpitation, chest discomfort or lower extremity swelling   Gastrointestinal:  Denies heartburn or change in bowel habits   Skin: Denies abnormal skin rashes Lymphatics: Denies new lymphadenopathy or easy bruising Neurological:Denies numbness, tingling or new weaknesses Behavioral/Psych: Mood is stable, no new changes  All other systems were reviewed with the patient and are negative.  PHYSICAL EXAMINATION:  ECOG PERFORMANCE STATUS: 1 - Symptomatic but completely ambulatory  Vitals:   09/02/17 0905  BP: 118/84  Pulse: 90  Resp: 18  Temp: 97.9 F (36.6 C)  SpO2: 100%   Filed Weights   09/02/17 0905  Weight: 209 lb  4.8 oz (94.9 kg)    GENERAL:alert, no distress and comfortable SKIN: skin  color, texture, turgor are normal, no rashes or significant lesions EYES: normal, conjunctiva are pink and non-injected, sclera clear OROPHARYNX:no exudate, no erythema and lips, buccal mucosa, and tongue normal  NECK: supple, thyroid normal size, non-tender, without nodularity LYMPH:  no palpable lymphadenopathy in the cervical, axillary or inguinal LUNGS: clear to auscultation and percussion with normal breathing effort HEART: regular rate & rhythm and no murmurs and no lower extremity edema ABDOMEN:abdomen soft, non-tender and normal bowel sounds Musculoskeletal:no cyanosis of digits and no clubbing  PSYCH: alert & oriented x 3 with fluent speech NEURO: no focal motor/sensory deficits BREAST: (+) Left Lower Quadrant mass is now no longer palpable. (+) Less prominent soft tissue fullness at 1cm in LUO quadrant 1:00-2:00 of left breast, non-tender (+) No palpable lymph nodes. Right breast exam benign   LABORATORY DATA:  I have reviewed the data as listed CBC Latest Ref Rng & Units 09/02/2017 08/26/2017 08/19/2017  WBC 3.9 - 10.3 K/uL 1.9(L) 1.8(L) 2.8(L)  Hemoglobin 11.6 - 15.9 g/dL 12.0 8.0(L) 8.7(L)  Hematocrit 34.8 - 46.6 % 34.9 23.5(L) 25.8(L)  Platelets 145 - 400 K/uL 191 197 207    CMP Latest Ref Rng & Units 09/02/2017 08/26/2017 08/19/2017  Glucose 70 - 140 mg/dL 102 124 105  BUN 7 - 26 mg/dL 12 7 7   Creatinine 0.60 - 1.10 mg/dL 0.67 0.72 0.66  Sodium 136 - 145 mmol/L 140 140 139  Potassium 3.5 - 5.1 mmol/L 4.1 3.6 4.0  Chloride 98 - 109 mmol/L 109 110(H) 109  CO2 22 - 29 mmol/L 23 25 24   Calcium 8.4 - 10.4 mg/dL 9.1 9.1 9.4  Total Protein 6.4 - 8.3 g/dL 6.5 6.2(L) 6.7  Total Bilirubin 0.2 - 1.2 mg/dL 0.4 0.4 0.4  Alkaline Phos 40 - 150 U/L 76 74 82  AST 5 - 34 U/L 37(H) 36(H) 32  ALT 0 - 55 U/L 46 46 45   PATHOLOGY  Diagnosis 05/10/17 1. Cervix, biopsy, 11 o'clock - ATYPICAL SQUAMOUS METAPLASIA  ASSOCIATED WITH INFLAMMATION. - SEE COMMENT. 2. Endocervix, curettage - DETACHED FRAGMENTS OF SQUAMOUS MUCOSA WITH SLIGHT ATYPIA. - BENIGN ENDOCERVICAL MUCOSA. - SEE COMMENT. Microscopic Comment 1. The sections show multiple fragments of mostly metaplastic squamous mucosa displaying prominent chronic and acute inflammation to a lesser extent. This is associated with epithelial atypia in the form of nuclear enlargement, and small nucleoli. The inflammatory process obscures the epithelium which is somewhat difficult to evaluate. A p16 stain was performed and is negative. Overall, definitive or diagnostic squamous intraepithelial lesion is not appreciated in this setting, and the changes may represent a florid reactive state. Nonetheless, clinical correlation and follow up is strongly recommended. 2. The sections show multiple detached fragments of squamous mucosa, some of which display slight atypia. This is admixed with multiple fragments of benign endocervical mucosa. A p16 stain was performed and is negative. The overall changes are not specific or diagnostic of a squamous intraepithelial lesion. Clinical correlation and follow up is recommended. (BNS:ah 05/12/17)  GYNECOLOGIC CYTOLOGY REPORT Adequacy Reason 05/10/17 Satisfactory for evaluation, endocervical/transformation zone component PRESENT. Diagnosis NEGATIVE FOR INTRAEPITHELIAL LESIONS OR MALIGNANCY. BENIGN REACTIVE/REPARATIVE CHANGES.    Diagnosis 04/19/17 1. Breast, left, needle core biopsy, 4:00 o'clock, ribbon clip - INVASIVE DUCTAL CARCINOMA. - SEE COMMENT. 2. Breast, left, needle core biopsy, 2:00 o'clock, coil clip - INVASIVE DUCTAL CARCINOMA. - SEE COMMENT. 3. Lymph node, needle/core biopsy, left axilla, spiral hydromark - DUCTAL CARCINOMA. - SEE COMMENT. Microscopic Comment 1. The carcinoma in the three specimens  is morphologically similar and is grade III. Lymph nodal tissue is not definitively identified in  specimen #3. A breast prognostic profile will be performed on part 2 and the results reported separately. 2. FLUORESCENCE IN-SITU HYBRIDIZATION Results: HER2 - NEGATIVE RATIO OF HER2/CEP17 SIGNALS 1.53 AVERAGE HER2 COPY NUMBER PER CELL 2.75 2. PROGNOSTIC INDICATORS Results: IMMUNOHISTOCHEMICAL AND MORPHOMETRIC ANALYSIS PERFORMED MANUALLY Estrogen Receptor: 0%, NEGATIVE Progesterone Receptor: 0%, NEGATIVE Proliferation Marker Ki67: 80% COMMENT: The negative hormone receptor study(ies) in this case has An internal positive control.    RADIOGRAPHIC STUDIES: I have personally reviewed the radiological images as listed and agreed with the findings in the report. US Breast Ltd Uni Left Inc Axilla  Result Date: 08/06/2017 CLINICAL DATA:  Follow-up multicentric invasive ductal carcinoma in the 2 o'clock and 4 o'clock positions of the left breast and metastatic left axillary adenopathy, treated with neoadjuvant chemotherapy. New mass felt on recent physical examination and by the patient in the upper outer left breast. The patient reports that this has decreased in size. EXAM: DIGITAL DIAGNOSTIC LEFT MAMMOGRAM WITH CAD AND TOMO ULTRASOUND LEFT BREAST COMPARISON:  Previous exam(s). ACR Breast Density Category b: There are scattered areas of fibroglandular density. FINDINGS: The previously demonstrated rounded, circumscribed mass in the 4 o'clock position of the left breast, containing a ribbon shaped biopsy marker clip, it is significantly smaller and less dense, with interspersed fat today. There is a persistent irregular density with distortion at that location measuring 2.8 x 2.5 x 0.4 cm. The mass at this location previously measured 5.2 x 4.2 x 3.9 cm. The previously demonstrated rounded mass in the 2 o'clock position of the left breast, containing a coil shaped biopsy marker clip, is also significantly smaller, less well-defined and has interspersed fat. This currently measures 0.7 x 0.5 x 0.4 cm  in maximum dimensions, previously 1.8 x 1.5 x 1.3 cm mammographically. The previously demonstrated enlarged left axillary lymph node is not visualized on today's mammogram images. Since the previously biopsied malignancies in the left breast are visible mammographically and not well defined masses at this time, these were not further evaluated with ultrasound today. Mammographic images were processed with CAD. On physical exam, there is mild fibronodular soft tissue thickening throughout the upper outer left breast in the area of palpable concern with no discrete palpable mass today. Targeted ultrasound is performed, showing normal appearing breast tissue at the location of palpable concern, including normal appearing fat lobules and normal appearing fibroglandular tissue. Ultrasound of the axillary tail region of the left breast and inferior left axilla does not demonstrate the previously biopsied abnormal lymph node. IMPRESSION: 1. No evidence of malignancy at the location of recent palpable concern in the upper outer left breast. 2. Marked decrease in size and conspicuity of the biopsy-proven invasive ductal carcinomas in the 2 o'clock and 4 o'clock positions of the left breast with no discrete residual masses. There is only mild ill-defined residual soft tissue density and interspersed fat at those locations and associated architectural distortion in the 4 o'clock position. 3. Complete imaging resolution of the previously biopsied metastatic left inferior axillary lymph node. RECOMMENDATION: Treatment plan. I have discussed the findings and recommendations with the patient. Results were also provided in writing at the conclusion of the visit. If applicable, a reminder letter will be sent to the patient regarding the next appointment. BI-RADS CATEGORY  6: Known biopsy-proven malignancy. Electronically Signed   By: Claudie Revering M.D.   On: 08/06/2017 12:49   Mm Diag Breast  Tomo Uni Left  Result Date:  08/06/2017 CLINICAL DATA:  Follow-up multicentric invasive ductal carcinoma in the 2 o'clock and 4 o'clock positions of the left breast and metastatic left axillary adenopathy, treated with neoadjuvant chemotherapy. New mass felt on recent physical examination and by the patient in the upper outer left breast. The patient reports that this has decreased in size. EXAM: DIGITAL DIAGNOSTIC LEFT MAMMOGRAM WITH CAD AND TOMO ULTRASOUND LEFT BREAST COMPARISON:  Previous exam(s). ACR Breast Density Category b: There are scattered areas of fibroglandular density. FINDINGS: The previously demonstrated rounded, circumscribed mass in the 4 o'clock position of the left breast, containing a ribbon shaped biopsy marker clip, it is significantly smaller and less dense, with interspersed fat today. There is a persistent irregular density with distortion at that location measuring 2.8 x 2.5 x 0.4 cm. The mass at this location previously measured 5.2 x 4.2 x 3.9 cm. The previously demonstrated rounded mass in the 2 o'clock position of the left breast, containing a coil shaped biopsy marker clip, is also significantly smaller, less well-defined and has interspersed fat. This currently measures 0.7 x 0.5 x 0.4 cm in maximum dimensions, previously 1.8 x 1.5 x 1.3 cm mammographically. The previously demonstrated enlarged left axillary lymph node is not visualized on today's mammogram images. Since the previously biopsied malignancies in the left breast are visible mammographically and not well defined masses at this time, these were not further evaluated with ultrasound today. Mammographic images were processed with CAD. On physical exam, there is mild fibronodular soft tissue thickening throughout the upper outer left breast in the area of palpable concern with no discrete palpable mass today. Targeted ultrasound is performed, showing normal appearing breast tissue at the location of palpable concern, including normal appearing fat  lobules and normal appearing fibroglandular tissue. Ultrasound of the axillary tail region of the left breast and inferior left axilla does not demonstrate the previously biopsied abnormal lymph node. IMPRESSION: 1. No evidence of malignancy at the location of recent palpable concern in the upper outer left breast. 2. Marked decrease in size and conspicuity of the biopsy-proven invasive ductal carcinomas in the 2 o'clock and 4 o'clock positions of the left breast with no discrete residual masses. There is only mild ill-defined residual soft tissue density and interspersed fat at those locations and associated architectural distortion in the 4 o'clock position. 3. Complete imaging resolution of the previously biopsied metastatic left inferior axillary lymph node. RECOMMENDATION: Treatment plan. I have discussed the findings and recommendations with the patient. Results were also provided in writing at the conclusion of the visit. If applicable, a reminder letter will be sent to the patient regarding the next appointment. BI-RADS CATEGORY  6: Known biopsy-proven malignancy. Electronically Signed   By: Claudie Revering M.D.   On: 08/06/2017 12:49    ASSESSMENT & PLAN:  Kritika Haidaryis a 22 y.o. 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  -She presented with a palpable left breast mass, measures 5.2 cm on ultrasound, with positive lymph node. Breast tumor biopsy showed triple negative breast ductal carcinoma. -Her staging CT and bone scan was  negative for distant metastasis. -She was  seen by Dr. Barry Dienes on 1/7 who feels she is potentially a candidate for breast conserving surgery but more likely will need at least left mastectomy if not bilateral mastectomies due to her young age, tumor size, and risk of recurrence.   -Given her clinical stage IIIC, triple negative disease, I recommended neoadjuvant regimen consisting of AC every 2 weeks for 4 cycles+ carboplatin/Taxol weekly for 12 cycles, followed by surgery then radiation. Side effects were discussed in great detail with her. She is agreed to proceed.  --Baseline echo on 04/30/17 normal with EF 60-65%.  -Genetic testing was negative for pathogenic mutation. 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 She will continue monthly zoladex injections for the duration of chemotherapy to suppress ovarian function; first dose given 1/11. -She completed 4 cycles neoadjuvant AC with neulasta from 05/12/17 - 06/24/17; she tolerated well overall.  -She is started neoadjuvant weekly taxol on 07/08/17 and developed severe generalized body aches and cramps, not responding to NSAIDs.  -I recommended changing to weekly Abraxane. In addition, given her aggressive triple negative T3N1 disease, will add carboplatin to her weekly regimen. We reviewed side effects such as increased fatigue, neuropathy, and hematologic toxicities; she agrees to proceed and started with cycle 2. -Her 07/15/17 exam showed a soft tissue fullness at the 12:00 of left breast, smaller on today's exam, will continue exam her. -She was prescribed Tramadol for her pain on 07/15/17 -She has mild discoloration of her tongue and sensitivity, I recommend she use baking soda and salt water mouth rinse 2-3 times a day and encouraged her to avoid alcohol based mouth washes.  -Labs reviewed, Hg 9.3 but no need for blood transfusion. Overall labs adequate to proceed with carbo/Abraxane today -She has seen plastic surgeon about breast  reconstruction.  She is very reluctant about mastectomy and reconstruction, I have discussed with Dr. Barry Dienes about possibly lumpectomy instead of mastectomy. She has reviewed the intrin scan which showed excellent partial response, but it would be still very challenged to do lumpectomy.  Will review her restaging breast MRI and discuss this again after she completes neoadjuvant chemotherapy. -Labs reviewed today, 09/02/2017 show CBC at WBC at 1.9, RDW at 16.1, Neutro at 0.9, and lymphs at 0.8 and CMP PENDING.  -Due to the patient with decreased WBCs, I will only give Abraxane today and hold the [place] -will schedule breast MRI 6/10  -I discussed with the patient and her brother that following chemotherapy, she will proceed to surgery and then radiation therapy.  We discussed the role of adjuvant Xeloda if she has significant residual disease, and the adjuvant immunotherapy Keytruda clinical trial if she qualifies. -F/u on 5/23  2.  Headaches, dizziness  -When she was diagnosed with breast cancer, she has 1 month history of daily headaches with associated dizziness, pain ranges from 5/10 - 9/10 on the crown of her head, ibuprofen helps some; she can continue this or take tylenol PRN, neuro exam was unremarkable -Given the aggressive nature of triple negative disease, I obtained brain MRI on 05/05/17 which showed no evidence of brain mets.  -Will monitor while on chemo -She reports intermittent dizziness and had a heaviness, sweats, possible related to menopause from chemo and Zoladex injection  3.  Anxiety  -She has considerable anxiety about her new diagnosis and treatment plan  -I previously prescribed low dose xanax for her to take PRN for anxiety especially at night if she has  difficulty sleeping -Stable   4.  Social issues -She lives with her family and has their support; does not work or drive -She does not currently have Scientist, product/process development, has been using Counselling psychologist -We have included  breast Engineer, site, social work, Clinical biochemist, and financial advocate for moral support and ongoing needs.   5.  Genetics  -Due to her young age and family history of prostate cancer, she has been referred to genetics -05/11/17 Genetic testing was negative for pathogenic mutation  6. Fertility planning  -We discussed the risk of infertility secondary to chemotherapy, she wishes to preserve fertility  -I have referred her to [facility] fertility specialist, initial appointment was on 1/11  -After discussion between the patient and her mother they decided not to pursue fertility preservation through [facility] -We have started her on Zoladex injection on 04/30/17, which hopefully will help to reduce the risk of chemo-induced infertility. -Continue monthly Zoladex injections until she complete chemo    7. Irregular vaginal bleeding -She has 2 month history of irregular vaginal bleeding when she was diagnosed with breast cancer -04/15/18 PAP with atypical squamous cells.  -She underwent colposcopy on 05/10/17 with D&C and results were negative for malignancy but with abnormal pap smear of cervix.  -Will continue to follow up with GYN.  -She has not had a menstrual period since she started chemo and Zoladex injection   8. Hot flashes, secondary to zoladex and chemotherapy  -She developed hot flashes after cycle 2, occurring 3 times per day lasting 1-2 minutes each.  -She is not interested in additional medication to manage this symptom. Will monitor -Tolerable overall   9. Peripheral neuropathy, G1, secondary to chemotherapy  -She developed mild intermittent numbness to fingertips after cycle 3 AC. Function is not limited.  No sensory deficits.   -previously reviewed that this can worsen on Taxol/carbo regimen and will monitor closely.  -If neuropathy should get worse on further chemo I previously discussed possible need to reduce dose or amend her treatment plan. She understands. -she has been using  ice bag on her hands and feet during chemo infusion, which would help reduce her risk of neuropathy -Overall very mild and stable, will monitor   PLAN:   -Labs adequate to proceed with cycle 9 Abraxane today, will hold carboplatin due to her neutropenia -Continue weekly carboplatin and Taxol, she has 3 more weeks to complete. -Breast MRI 6/10  -F/u 5/23  No orders of the defined types were placed in this encounter.     All questions were answered. The patient knows to call the clinic with any problems, questions or concerns. I spent 15 minutes counseling the patient face to face. The total time spent in the appointment was 20 minutes and more than 50% was on counseling.     [name], MD 09/02/2017 3:25 PM  This document serves as a record of services personally performed by [name], MD. It was created on her behalf by Steva Colder, a trained medical scribe. The creation of this record is based on the scribe's personal observations and the provider's statements to them.   I have reviewed the above documentation for accuracy and completeness, and I agree with the above.

## 2017-09-02 ENCOUNTER — Inpatient Hospital Stay: Payer: Medicaid Other

## 2017-09-02 ENCOUNTER — Inpatient Hospital Stay (HOSPITAL_BASED_OUTPATIENT_CLINIC_OR_DEPARTMENT_OTHER): Payer: Medicaid Other | Admitting: Hematology

## 2017-09-02 ENCOUNTER — Encounter: Payer: Self-pay | Admitting: Hematology

## 2017-09-02 ENCOUNTER — Telehealth: Payer: Self-pay

## 2017-09-02 VITALS — BP 118/84 | HR 90 | Temp 97.9°F | Resp 18 | Ht 64.0 in | Wt 209.3 lb

## 2017-09-02 DIAGNOSIS — Z5111 Encounter for antineoplastic chemotherapy: Secondary | ICD-10-CM | POA: Diagnosis not present

## 2017-09-02 DIAGNOSIS — C50812 Malignant neoplasm of overlapping sites of left female breast: Secondary | ICD-10-CM

## 2017-09-02 DIAGNOSIS — G620 Drug-induced polyneuropathy: Secondary | ICD-10-CM | POA: Diagnosis not present

## 2017-09-02 DIAGNOSIS — Z171 Estrogen receptor negative status [ER-]: Secondary | ICD-10-CM | POA: Diagnosis not present

## 2017-09-02 DIAGNOSIS — F419 Anxiety disorder, unspecified: Secondary | ICD-10-CM

## 2017-09-02 DIAGNOSIS — T451X5A Adverse effect of antineoplastic and immunosuppressive drugs, initial encounter: Secondary | ICD-10-CM | POA: Diagnosis not present

## 2017-09-02 DIAGNOSIS — D6481 Anemia due to antineoplastic chemotherapy: Secondary | ICD-10-CM

## 2017-09-02 DIAGNOSIS — Z95828 Presence of other vascular implants and grafts: Secondary | ICD-10-CM

## 2017-09-02 DIAGNOSIS — C773 Secondary and unspecified malignant neoplasm of axilla and upper limb lymph nodes: Secondary | ICD-10-CM

## 2017-09-02 DIAGNOSIS — R42 Dizziness and giddiness: Secondary | ICD-10-CM

## 2017-09-02 DIAGNOSIS — R51 Headache: Secondary | ICD-10-CM

## 2017-09-02 LAB — CBC WITH DIFFERENTIAL (CANCER CENTER ONLY)
Basophils Absolute: 0 10*3/uL (ref 0.0–0.1)
Basophils Relative: 1 %
Eosinophils Absolute: 0 10*3/uL (ref 0.0–0.5)
Eosinophils Relative: 1 %
HEMATOCRIT: 34.9 % (ref 34.8–46.6)
Hemoglobin: 12 g/dL (ref 11.6–15.9)
LYMPHS PCT: 41 %
Lymphs Abs: 0.8 10*3/uL — ABNORMAL LOW (ref 0.9–3.3)
MCH: 32.3 pg (ref 25.1–34.0)
MCHC: 34.5 g/dL (ref 31.5–36.0)
MCV: 93.7 fL (ref 79.5–101.0)
MONO ABS: 0.2 10*3/uL (ref 0.1–0.9)
MONOS PCT: 10 %
NEUTROS ABS: 0.9 10*3/uL — AB (ref 1.5–6.5)
NEUTROS PCT: 47 %
Platelet Count: 191 10*3/uL (ref 145–400)
RBC: 3.73 MIL/uL (ref 3.70–5.45)
RDW: 16.1 % — AB (ref 11.2–14.5)
WBC Count: 1.9 10*3/uL — ABNORMAL LOW (ref 3.9–10.3)

## 2017-09-02 LAB — CMP (CANCER CENTER ONLY)
ALBUMIN: 3.7 g/dL (ref 3.5–5.0)
ALT: 46 U/L (ref 0–55)
ANION GAP: 8 (ref 3–11)
AST: 37 U/L — ABNORMAL HIGH (ref 5–34)
Alkaline Phosphatase: 76 U/L (ref 40–150)
BUN: 12 mg/dL (ref 7–26)
CO2: 23 mmol/L (ref 22–29)
Calcium: 9.1 mg/dL (ref 8.4–10.4)
Chloride: 109 mmol/L (ref 98–109)
Creatinine: 0.67 mg/dL (ref 0.60–1.10)
GFR, Est AFR Am: >60 mL/min (ref >60)
GFR, Estimated: >60 mL/min (ref >60)
GLUCOSE: 102 mg/dL (ref 70–140)
POTASSIUM: 4.1 mmol/L (ref 3.5–5.1)
Sodium: 140 mmol/L (ref 136–145)
Total Bilirubin: 0.4 mg/dL (ref 0.2–1.2)
Total Protein: 6.5 g/dL (ref 6.4–8.3)

## 2017-09-02 MED ORDER — DEXAMETHASONE SODIUM PHOSPHATE 10 MG/ML IJ SOLN
INTRAMUSCULAR | Status: AC
  Filled 2017-09-02: qty 1

## 2017-09-02 MED ORDER — SODIUM CHLORIDE 0.9 % IV SOLN
Freq: Once | INTRAVENOUS | Status: AC
  Administered 2017-09-02: 10:00:00 via INTRAVENOUS

## 2017-09-02 MED ORDER — SODIUM CHLORIDE 0.9% FLUSH
10.0000 mL | INTRAVENOUS | Status: DC | PRN
  Administered 2017-09-02: 10 mL
  Filled 2017-09-02: qty 10

## 2017-09-02 MED ORDER — DEXAMETHASONE SODIUM PHOSPHATE 10 MG/ML IJ SOLN
10.0000 mg | Freq: Once | INTRAMUSCULAR | Status: AC
  Administered 2017-09-02: 10 mg via INTRAVENOUS

## 2017-09-02 MED ORDER — ANTICOAGULANT SODIUM CITRATE 4% (200MG/5ML) IV SOLN
5.0000 mL | Freq: Once | Status: AC
Start: 2017-09-02 — End: 2017-09-02
  Administered 2017-09-02: 5 mL via INTRAVENOUS
  Filled 2017-09-02: qty 5

## 2017-09-02 MED ORDER — SODIUM CHLORIDE 0.9% FLUSH
10.0000 mL | Freq: Once | INTRAVENOUS | Status: AC
  Administered 2017-09-02: 10 mL
  Filled 2017-09-02: qty 10

## 2017-09-02 MED ORDER — PACLITAXEL PROTEIN-BOUND CHEMO INJECTION 100 MG
80.0000 mg/m2 | Freq: Once | INTRAVENOUS | Status: AC
  Administered 2017-09-02: 150 mg via INTRAVENOUS
  Filled 2017-09-02: qty 30

## 2017-09-02 MED ORDER — PALONOSETRON HCL INJECTION 0.25 MG/5ML
INTRAVENOUS | Status: AC
  Filled 2017-09-02: qty 5

## 2017-09-02 MED ORDER — PALONOSETRON HCL INJECTION 0.25 MG/5ML
0.2500 mg | Freq: Once | INTRAVENOUS | Status: AC
  Administered 2017-09-02: 0.25 mg via INTRAVENOUS

## 2017-09-02 NOTE — Patient Instructions (Addendum)
[facility] Discharge Instructions for Patients Receiving Chemotherapy  Today you received the following chemotherapy agents: abraxane.  To help prevent nausea and vomiting after your treatment, we encourage you to take your nausea medication as directed.   If you develop nausea and vomiting that is not controlled by your nausea medication, call the clinic.   BELOW ARE SYMPTOMS THAT SHOULD BE REPORTED IMMEDIATELY:  *FEVER GREATER THAN 100.5 F  *CHILLS WITH OR WITHOUT FEVER  NAUSEA AND VOMITING THAT IS NOT CONTROLLED WITH YOUR NAUSEA MEDICATION  *UNUSUAL SHORTNESS OF BREATH  *UNUSUAL BRUISING OR BLEEDING  TENDERNESS IN MOUTH AND THROAT WITH OR WITHOUT PRESENCE OF ULCERS  *URINARY PROBLEMS  *BOWEL PROBLEMS  UNUSUAL RASH Items with * indicate a potential emergency and should be followed up as soon as possible.  Feel free to call the clinic should you have any questions or concerns. The clinic phone number is [phone].  Please show the CHEMO ALERT CARD at check-in to the Emergency Department and triage nurse.   

## 2017-09-02 NOTE — Progress Notes (Signed)
Per Dr. Burr Medico okay to treat pt with ANC of 0.9 and pt will not be receiving Carboplatin today.

## 2017-09-02 NOTE — Telephone Encounter (Signed)
Okay to treat with ANC 0.9 and no carbo today.  Notified Kelsey in Infusion.

## 2017-09-03 ENCOUNTER — Telehealth: Payer: Self-pay | Admitting: Hematology

## 2017-09-03 NOTE — Telephone Encounter (Signed)
Breast MRI 6/10 per / los

## 2017-09-08 NOTE — Progress Notes (Signed)
[place]  [phone] Fax:([phone]) [phone]  Clinic Follow Up Note   Patient Care Team: Patient, No Pcp Per as PCP - General ([place]) [name], MD as Consulting Physician (General Surgery) [name], MD as Consulting Physician (Hematology) [name], NP as Nurse Practitioner (Nurse Practitioner)   Date of Service:  09/09/2017  CHIEF COMPLAINTS:  Follow up left breast cancer, triple negative   Oncology History   Cancer Staging Cancer of overlapping sites of left breast [facility]) Staging form: Breast, AJCC 8th Edition - Clinical stage from 04/19/2017: Stage IIIC (cT3(m), cN1, cM0, G3, ER: Negative, PR: Negative, HER2: Negative) - Signed by [name], MD on 04/28/2017 - Pathologic: No stage assigned - Unsigned       Cancer of overlapping sites of left breast ([place])   04/15/2017 Mammogram    IMPRESSION: 1. Highly suspicious irregular mass within the left breast at the 4 o'clock axis, 8 cm from the nipple, measuring 5.2 cm, corresponding to the area of clinical concern. Ultrasound-guided biopsy is recommended. 2. Additional suspicious irregular mass within the left breast at the 2 o'clock axis, 8 cm from the nipple, measuring 1.6 cm, corresponding to the mammographic finding. Ultrasound-guided biopsy is recommended. 3. Additional suspicious mass within the left breast at the 2 o'clock axis, 10 cm from the nipple, axillary tail region, measuring 3 cm, suspected lymph node completely replaced by tumor. Ultrasound-guided biopsy is recommended. 4. No evidence of malignancy within the right breast.       04/19/2017 Initial Biopsy    Diagnosis 1. Breast, left, needle core biopsy, 4:00 o'clock, ribbon clip - INVASIVE DUCTAL CARCINOMA. - SEE COMMENT. 2. Breast, left, needle core biopsy, 2:00 o'clock, coil clip - INVASIVE DUCTAL CARCINOMA. - SEE COMMENT. 3. Lymph node, needle/core biopsy, left axilla, spiral hydromark - DUCTAL CARCINOMA. -  SEE COMMENT.  2. PROGNOSTIC INDICATORS Results: IMMUNOHISTOCHEMICAL AND MORPHOMETRIC ANALYSIS PERFORMED MANUALLY Estrogen Receptor: 0%, NEGATIVE Progesterone Receptor: 0%, NEGATIVE Proliferation Marker Ki67: 80%  2. FLUORESCENCE IN-SITU HYBRIDIZATION Results: HER2 - NEGATIVE RATIO OF HER2/CEP17 SIGNALS 1.53 AVERAGE HER2 COPY NUMBER PER CELL 2.75  Microscopic Comment 1. The carcinoma in the three specimens is morphologically similar and is grade III. Lymph nodal tissue is not definitively identified in specimen #3. A breast prognostic profile will be performed on part 2 and the results reported separately.      04/19/2017 Initial Diagnosis    Cancer of overlapping sites of left breast ([place])      04/30/2017 Breast MRI    IMPRESSION: 1. Biopsy-proven invasive carcinoma within the lower outer quadrant of the LEFT breast, 4 o'clock axis, at posterior depth, measuring 4.8 cm, with associated biopsy clip artifact. 2. Biopsy-proven invasive carcinoma within the upper-outer quadrant of the LEFT breast, 2 o'clock axis, at posterior depth, measuring 2.5 cm, with associated biopsy clip artifact. However, contiguous non mass enhancement along the posterior margin of this 2 o'clock mass and extending 3 cm anteriorly from the mass increases the overall measurement to 5.5 cm greatest dimension (AP). 3. Diffuse edema throughout the LEFT breast, with particularly prominent component of edema extending posteriorly to abut the anterior surface of the pectoralis muscle, and diffuse skin thickening throughout the left breast. This almost certainly indicates INFLAMMATORY BREAST CANCER. 4. Biopsy-proven metastatic lymph node within the LEFT axilla measures 4.1 cm. Two additional borderline prominent lymph nodes within the left axilla. No enlarged lymph nodes within the right axilla or internal mammary chain regions. 5. No evidence of malignancy within  the RIGHT breast.  RECOMMENDATION: Per  current treatment plan for patient's known left breast cancer (2 biopsy-proven sites) and metastatic lymph node in the left axilla.  BI-RADS CATEGORY  6: Known biopsy-proven malignancy.       04/30/2017 Echocardiogram    Study Conclusions  - Left ventricle: The cavity size was normal. Wall thickness was   normal. Systolic function was normal. The estimated ejection   fraction was in the range of 60% to 65%. Wall motion was normal;   there were no regional wall motion abnormalities. Left   ventricular diastolic function parameters were normal. GLS:   -20.4%, RV S vel 12.1cm/s.      05/05/2017 Imaging    MRI BRAIN IMPRESSION: No intracranial or calvarial metastatic disease. Normal MRI of the brain.      05/07/2017 Imaging    CT CAP IMPRESSION: Two left breast masses and a grossly abnormal 3.9 cm lower left axillary lymph node, likely correspond to multifocal left breast cancer metastatic to the left axilla. Skin thickening of the left breast, raising concern for an inflammatory breast cancer.  1 cm hypoattenuated lesion in the posterior dome of the liver with internal attenuation slightly higher than water. This may represent a complicated liver cyst, hemangioma or a focus of metastatic disease.  No other findings to suggest distal metastatic disease.       05/07/2017 Imaging    BONE SCAN IMPRESSION: 1. No evidence of osseous metastases. 2. Abnormal uptake in the soft tissues of the left breast likely by the patient's known breast cancer.       05/11/2017 Genetic Testing    Common Cancers panel (47 genes) @ [facility] - No pathoagenic mutations detected Five Variants of Uncertain Significance were detected:  APC c.2438A>G (p.Asn813Ser)  BARD1 c.782T>C (p.Leu261Pro) MSH3 c.1655C>T (p.Thr552Ile)  MSH6 c.2108T>C (p.Met703Thr)  NTHL1 c.470G>C (p.Arg157Pro)   Genes Analyzed: 47 genes on [facility]'s Common Cancers panel (APC, ATM, AXIN2, BARD1, BMPR1A, BRCA1, BRCA2,  BRIP1, CDH1, CDK4, CDKN2A, CHEK2, CTNNA1, DICER1, EPCAM, GREM1, HOXB13, KIT, MEN1, MLH1, MSH2, MSH3, MSH6, MUTYH, NBN, NF1, NTHL1, PALB2, PDGFRA, PMS2, POLD1, POLE, PTEN, RAD50, RAD51C, RAD51D, SDHA, SDHB, SDHC, SDHD, SMAD4, SMARCA4, STK11, TP53, TSC1, TSC2, VHL).       05/12/2017 -  Chemotherapy    1. Neoadjuvant AC q2 weeks x4 cycles then weekly 05/12/17 - 06/24/17 followed by carbo/taxol for 12 weeks starting 07/08/17. Due to poor toleration, changed Taxol to abraxane and added Carboplatin with cycle 2.  2. Monthly zoladex injections starting 04/30/17 for the duration of chemotherapy.       08/06/2017 Mammogram    The previously demonstrated rounded mass in the 2 o'clock position of the left breast, containing a coil shaped biopsy marker clip, is also significantly smaller, less well-defined and has interspersed fat. This currently measures 0.7 x 0.5 x 0.4 cm in maximum dimensions, previously 1.8 x 1.5 x 1.3 cm mammographically.  IMPRESSION: 1. No evidence of malignancy at the location of recent palpable concern in the upper outer left breast. 2. Marked decrease in size and conspicuity of the biopsy-proven invasive ductal carcinomas in the 2 o'clock and 4 o'clock positions of the left breast with no discrete residual masses. There is only mild ill-defined residual soft tissue density and interspersed fat at those locations and associated architectural distortion in the 4 o'clock position. 3. Complete imaging resolution of the previously biopsied metastatic left inferior axillary lymph node.       HISTORY OF PRESENTING ILLNESS: 04/28/17 Ardath Sax  22 y.o. female is here because of newly diagnosed left breast cancer. She was referred by Dr. Barry Dienes. She presents with her brother and his wife; she speaks minimal [place], her family interpreted for her as she refused interpreter services. She initially palpated a left breast mass 6 weeks ago that was painful with breast swelling and felt to be  enlarging. Has not had prior mammogram. Denies nipple inversion, discharge, or skin dimpling. She went to [facility] urgent care and was then referred to the breast center for imaging and diagnostics. Diagnostic mammogram showed a dominant mass in the left breast at the 4 o'clock axis, 8 cm from the nipple, measuring 5.2 x 4.3 x 4.3 cm; a satellite mass in the left breast at the 2 o'clock axis, 8 cm from the nipple, measuring 1.6 x 1.3 x 1.3 cm; and a circumscribed hypoechoic mass in the left breast at the 2 o'clock axis, 10 cm from the nipple, axillary tail measuring 3 x 2.3 x 2.8 cm, suspected lymph node completely replaced by tumor.  Ultrasound-guided biopsy of all 3 masses were positive for invasive ductal carcinoma, ER/PR negative, HER-2 negative, grade 3.    In addition she reports 1 month history of frequent daily headaches with associated dizziness and occasional left eye pain. Pain often wakes her up from sleep, has tried ibuprofen with some relief. Pain usually at the crown of her head, average 5/10 - 9/10 on pain scale. Not sensitive to light or sound. Denies fall. Reports fatigue for 1 week, denies weight loss, decreased appetite, abdominal pain. Over last 2 months she has irregular vaginal bleeding, bleeding 20 days out of the last month. Recently had abnormal PAP smear, colposcopy is pending. She feels very anxious about her diagnosis.  She has no significant past medical history. Her father has prostate cancer diagnosed at age 29. Paternal aunt had "abdominal cancer." a paternal uncle also had prostate cancer diagnosed age 70. Negative family history of breast or GYN cancer. She lives with her family in a home with her parents, brother, and his brother's wife. She does not work or drive. She lived in [place] and moved to [place] 9 months ago. She has permanent resident card, no health insurance. Has been getting assistance with BCCCP program through [employer].  GYN HISTORY  Menarchal:  7 LMP: currently menstruating  Contraceptive: none HRT: none GP: G0    CURRENT THERAPY:   1. Neoadjuvant AC q2 weeks x4 cycles then weekly 05/12/17 - 06/24/17 followed by carbo/taxol for 12 weeks starting 07/08/17. Due to poor toleration, changed Taxol to abraxane and added Carboplatin with cycle 2. Dose reduced Abraxane and Carboplatin due to neuropathy starting with cycle 10.  2. Monthly Zoladex injections starting 04/30/17 for the duration of chemotherapy.   INTERVAL HISTORY:   Shirly Bartosiewicz is here for a follow up and cycle 10 Taxol/Carbo. She presents to the clinic today accompanied by her brother who helps translate for her. She notes she has been well and tolerated her last cycle. She has been overall stable. 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 She takes it in the evening.    On review of symptoms, pt notes still having some dizziness. She notes pain in her fingers all the time 6-7/10. She has had this for 2-3 weeks and it is getting worse over time. She notes her toes are little less noticeable than her fingers. She notes it is hard for her to button her shirts but her walking is  fine. The last 3 weeks she had not been using ice during infusion.      MEDICAL HISTORY:  Past Medical History:  Diagnosis Date  . Breast cancer ([place]) left  . Family history of cancer   . Genetic testing 04/29/2017   Common Cancers panel (47 genes) @ [facility] - No pathoagenic mutations detected    SURGICAL HISTORY: Past Surgical History:  Procedure Laterality Date  . IR FLUORO GUIDE PORT INSERTION RIGHT  05/03/2017  . IR US GUIDE VASC ACCESS RIGHT  05/03/2017    SOCIAL HISTORY: Social History   Socioeconomic History  . Marital status: Single    Spouse name: Not on file  . Number of children: Not on file  . Years of education: Not on file  . Highest education level: Not on file  Occupational History  . 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 Financial resource  strain: Not on file  . Food insecurity:    Worry: Not on file    Inability: Not on file  . Transportation needs:    Medical: Not on file    Non-medical: Not on file  Tobacco Use  . Smoking status: Never Smoker  . Smokeless tobacco: Never Used  Substance and Sexual Activity  . Alcohol use: No    Frequency: Never  . Drug use: No  . Sexual activity: Never    Birth control/protection: None  Lifestyle  . Physical activity:    Days per week: 3 days    Minutes per session: 30 min  . Stress: Not at all  Relationships  . Social connections:    Talks on phone: More than three times a week    Gets together: More than three times a week    Attends religious service: 1 to 4 times per year    Active member of club or organization: No    Attends meetings of clubs or organizations: Never    Relationship status: Never married  . Intimate partner violence:    Fear of current or ex partner: No    Emotionally abused: No    Physically abused: No    Forced sexual activity: No  Other Topics Concern  . Not on file  Social History Narrative  . Not on file    FAMILY HISTORY: Family History  Problem Relation Age of Onset  . Hyperlipidemia Mother   . Hypertension Mother   . Prostate cancer Father 30       currently 62  . Stomach cancer Paternal Aunt 66       deceased 91s  . Prostate cancer Paternal Uncle 71       deceased 56    ALLERGIES:  is allergic to heparin.  MEDICATIONS:  Current Outpatient Medications  Medication Sig Dispense Refill  . acetaminophen-codeine (TYLENOL #3) 300-30 MG tablet Take 1 tablet by mouth every 6 (six) hours as needed for moderate pain. 30 tablet 0  . ALPRAZolam (XANAX) 0.25 MG tablet Take 1 tablet (0.25 mg total) by mouth at bedtime as needed for anxiety. 30 tablet 0  . DULoxetine (CYMBALTA) 20 MG capsule Take 2 capsules (40 mg total) by mouth daily. 60 capsule 0  . ibuprofen (ADVIL,MOTRIN) 200 MG tablet Take 2 tablets (400 mg total) by mouth every 6  (six) hours as needed. 30 tablet 0  . lidocaine-prilocaine (EMLA) cream Apply to affected area once 30 g 3  . LORazepam (ATIVAN) 0.5 MG tablet Take 1 tablet (0.5 mg total) by mouth every 8 (  eight) hours as needed (nausea, vomiting). 20 tablet 0  . ondansetron (ZOFRAN) 8 MG tablet Take 1 tablet (8 mg total) by mouth 2 (two) times daily as needed. Start on the third day after chemotherapy. 30 tablet 1  . prochlorperazine (COMPAZINE) 10 MG tablet Take 1 tablet (10 mg total) by mouth every 6 (six) hours as needed (Nausea or vomiting). 30 tablet 1   No current facility-administered medications for this visit.     REVIEW OF SYSTEMS:   Constitutional: Denies fevers, chills or abnormal night sweats   (+) dizziness   Eyes: Denies blurriness of vision, double vision or watery eyes Ears, nose, mouth, throat, and face: Denies mucositis or sore throat  (+) tongue discoloration with sensitivity.  Respiratory: Denies cough, dyspnea or wheezes Cardiovascular: Denies palpitation, chest discomfort or lower extremity swelling   Gastrointestinal:  Denies heartburn or change in bowel habits   Skin: Denies abnormal skin rashes Lymphatics: Denies new lymphadenopathy or easy bruising Neurological: (+) neuropathy tingling in her fingers > toes Behavioral/Psych: Mood is stable, no new changes  All other systems were reviewed with the patient and are negative.  PHYSICAL EXAMINATION:  ECOG PERFORMANCE STATUS: 1 - Symptomatic but completely ambulatory  Vitals:   09/09/17 0951  BP: 112/87  Pulse: (!) 107  Resp: 18  Temp: 97.9 F (36.6 C)  SpO2: 100%   Filed Weights   09/09/17 0951  Weight: 211 lb 11.2 oz (96 kg)    GENERAL:alert, no distress and comfortable SKIN: skin color, texture, turgor are normal, no rashes or significant lesions EYES: normal, conjunctiva are pink and non-injected, sclera clear OROPHARYNX:no exudate, no erythema and lips, buccal mucosa, and tongue normal  NECK: supple, thyroid  normal size, non-tender, without nodularity LYMPH:  no palpable lymphadenopathy in the cervical, axillary or inguinal LUNGS: clear to auscultation and percussion with normal breathing effort HEART: regular rate & rhythm and no murmurs and no lower extremity edema ABDOMEN:abdomen soft, non-tender and normal bowel sounds Musculoskeletal:no cyanosis of digits and no clubbing  PSYCH: alert & oriented x 3 with fluent speech NEURO: no focal motor/sensory deficits BREAST: (+) Left Lower Quadrant mass is now no longer palpable. (+) Less prominent soft tissue fullness at 1cm in LUO quadrant 1:00-2:00 of left breast, non-tender (+) No palpable lymph nodes. Right breast exam benign   LABORATORY DATA:  I have reviewed the data as listed CBC Latest Ref Rng & Units 09/09/2017 09/02/2017 08/26/2017  WBC 3.9 - 10.3 K/uL 2.3(L) 1.9(L) 1.8(L)  Hemoglobin 11.6 - 15.9 g/dL 11.0(L) 12.0 8.0(L)  Hematocrit 34.8 - 46.6 % 32.3(L) 34.9 23.5(L)  Platelets 145 - 400 K/uL 205 191 197    CMP Latest Ref Rng & Units 09/09/2017 09/02/2017 08/26/2017  Glucose 70 - 140 mg/dL 106 102 124  BUN 7 - 26 mg/dL _0 Creatinine 0.60 - 1.10 mg/dL 0.68 0.67 0.72  Sodium 136 - 145 mmol/L 138 140 140  Potassium 3.5 - 5.1 mmol/L 4.0 4.1 3.6  Chloride 98 - 109 mmol/L 107 109 110(H)  CO2 22 - 29 mmol/L _1 Calcium 8.4 - 10.4 mg/dL 9.3 9.1 9.1  Total Protein 6.4 - 8.3 g/dL 6.5 6.5 6.2(L)  Total Bilirubin 0.2 - 1.2 mg/dL 0.7 0.4 0.4  Alkaline Phos 40 - 150 U/L 74 76 74  AST 5 - 34 U/L 31 37(H) 36(H)  ALT 0 - 55 U/L 48 46 46   ANC 1.1K today  PATHOLOGY  Diagnosis 05/10/17 1. Cervix, biopsy,  11 o'clock - ATYPICAL SQUAMOUS METAPLASIA ASSOCIATED WITH INFLAMMATION. - SEE COMMENT. 2. Endocervix, curettage - DETACHED FRAGMENTS OF SQUAMOUS MUCOSA WITH SLIGHT ATYPIA. - BENIGN ENDOCERVICAL MUCOSA. - SEE COMMENT. Microscopic Comment 1. The sections show multiple fragments of mostly metaplastic squamous mucosa displaying prominent  chronic and acute inflammation to a lesser extent. This is associated with epithelial atypia in the form of nuclear enlargement, and small nucleoli. The inflammatory process obscures the epithelium which is somewhat difficult to evaluate. A p16 stain was performed and is negative. Overall, definitive or diagnostic squamous intraepithelial lesion is not appreciated in this setting, and the changes may represent a florid reactive state. Nonetheless, clinical correlation and follow up is strongly recommended. 2. The sections show multiple detached fragments of squamous mucosa, some of which display slight atypia. This is admixed with multiple fragments of benign endocervical mucosa. A p16 stain was performed and is negative. The overall changes are not specific or diagnostic of a squamous intraepithelial lesion. Clinical correlation and follow up is recommended. (BNS:ah 05/12/17)  GYNECOLOGIC CYTOLOGY REPORT Adequacy Reason 05/10/17 Satisfactory for evaluation, endocervical/transformation zone component PRESENT. Diagnosis NEGATIVE FOR INTRAEPITHELIAL LESIONS OR MALIGNANCY. BENIGN REACTIVE/REPARATIVE CHANGES.    Diagnosis 04/19/17 1. Breast, left, needle core biopsy, 4:00 o'clock, ribbon clip - INVASIVE DUCTAL CARCINOMA. - SEE COMMENT. 2. Breast, left, needle core biopsy, 2:00 o'clock, coil clip - INVASIVE DUCTAL CARCINOMA. - SEE COMMENT. 3. Lymph node, needle/core biopsy, left axilla, spiral hydromark - DUCTAL CARCINOMA. - SEE COMMENT. Microscopic Comment 1. The carcinoma in the three specimens is morphologically similar and is grade III. Lymph nodal tissue is not definitively identified in specimen #3. A breast prognostic profile will be performed on part 2 and the results reported separately. 2. FLUORESCENCE IN-SITU HYBRIDIZATION Results: HER2 - NEGATIVE RATIO OF HER2/CEP17 SIGNALS 1.53 AVERAGE HER2 COPY NUMBER PER CELL 2.75 2. PROGNOSTIC INDICATORS Results: IMMUNOHISTOCHEMICAL  AND MORPHOMETRIC ANALYSIS PERFORMED MANUALLY Estrogen Receptor: 0%, NEGATIVE Progesterone Receptor: 0%, NEGATIVE Proliferation Marker Ki67: 80% COMMENT: The negative hormone receptor study(ies) in this case has An internal positive control.    RADIOGRAPHIC STUDIES: I have personally reviewed the radiological images as listed and agreed with the findings in the report. No results found.  ASSESSMENT & PLAN:  Vicy Medico a 22 y.o. 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  -She presented with a palpable left breast mass, measures 5.2 cm on ultrasound, with positive lymph node. Breast tumor biopsy showed triple negative breast ductal carcinoma. -Her staging CT and bone scan was negative for distant metastasis. -She was seen by Dr. Barry Dienes on 1/7 who feels she is potentially a candidate for breast conserving surgery but more likely will need at least left mastectomy if not bilateral mastectomies due to her young age, tumor size, and risk of recurrence.   -Given her clinical stage IIIC, triple negative disease, I recommended neoadjuvant regimen consisting of AC every 2 weeks for 4 cycles+ carboplatin/Taxol weekly for 12 cycles, followed by surgery then radiation. Side effects were discussed in great detail with her. She is agreed to proceed.  --Baseline echo on 04/30/17 normal with EF 60-65%.  -Genetic  testing was negative for pathogenic mutation. 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 She will  continue monthly Zoladex injections for the duration of chemotherapy to suppress ovarian function; first dose given 1/11. -She completed 4 cycles neoadjuvant AC with neulasta from 05/12/17 - 06/24/17; she tolerated well overall.  -She is started neoadjuvant weekly taxol on 07/08/17 and developed severe generalized body aches and cramps, not responding to NSAIDs.  -I recommended changing to weekly Abraxane. In addition, given her aggressive triple negative T3N1 disease, will add carboplatin to her weekly regimen.  -Her interim mammogram and [place] on 4/19 showed excellent near complete response  -She has seen plastic surgeon about breast reconstruction. She is very reluctant about mastectomy and reconstruction, I have discussed with Dr. Barry Dienes about possibly lumpectomy instead of mastectomy. She has reviewed the intrin scan which showed excellent partial response, but it would be still very challenged to do lumpectomy. Will review her restaging breast MRI and discuss this again after she completes neoadjuvant chemotherapy. -I discussed with the patient and her brother that following chemotherapy, she will proceed to surgery and then radiation therapy.  We discussed the role of adjuvant Xeloda if she has significant residual disease, and the adjuvant immunotherapy Keytruda clinical trial if she qualifies. 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  -Neutropenia has recovered some, ANC 1.1 today, will resume carboplatin at AUC 1.5 today -We will schedule her restaging breast MRI in 2 weeks, I also sent a message to Dr. Barry Dienes for her follow-up appointment after the breast MRI -F/u in 1 week with lacie    2.  Headaches, dizziness   -When she was diagnosed with breast cancer, she has 1 month history of daily  headaches with associated dizziness, pain ranges from 5/10 - 9/10 on the crown of her head, ibuprofen helps some; she can continue this or take tylenol PRN, neuro exam was unremarkable -Given the aggressive nature of triple negative disease, I obtained brain MRI on 05/05/17 which showed no evidence of brain mets.  -Will monitor while on chemo -She reports intermittent dizziness and had a heaviness, sweats, possible related to menopause from chemo and Zoladex injection -She still has dizziness, stable.   3.  Anxiety  -She has considerable anxiety about her new diagnosis and treatment plan  -I previously prescribed low dose xanax for her to take PRN for anxiety especially at night if she has difficulty sleeping -Stable   4.  Social issues -She lives with her family and has their support; does not work or drive -She does not currently have Scientist, product/process development, has been using Counselling psychologist -We have included breast Engineer, site, social work, Clinical biochemist, and financial advocate for moral support and ongoing needs.  -I discussed if she needs transportation help during her radiation treatment we can help set this up. I recommend she speak with our Social Work office, I will set up consultation.   5. Genetics  -Due to her young age and family history of prostate cancer, she has been referred to genetics -05/11/17 Genetic testing was negative for pathogenic mutation  6. Fertility planning  -We discussed the risk of infertility secondary to chemotherapy, she wishes to preserve fertility  -I have referred her to [facility] fertility specialist, initial appointment was on 1/11  -  After discussion between the patient and her mother they decided not to pursue fertility preservation through [facility] -We have started her on Zoladex injection on 04/30/17, which hopefully will help to reduce the risk of chemo-induced infertility. -Continue monthly Zoladex injections until she complete chemo    7. Irregular vaginal  bleeding -She has 2 month history of irregular vaginal bleeding when she was diagnosed with breast cancer -04/15/18 PAP with atypical squamous cells.  -She underwent colposcopy on 05/10/17 with [facility] and results were negative for malignancy but with abnormal pap smear of cervix.  -Will continue to follow up with GYN.  -She has not had a menstrual period since she started chemo and Zoladex injection   8. Hot flashes, secondary to Zoladex and chemotherapy  -She developed hot flashes after cycle 2, occurring 3 times per day lasting 1-2 minutes each.  -She is not interested in additional medication to manage this symptom. Will monitor -Tolerable overall   9. Peripheral neuropathy, G2, secondary to chemotherapy  -She developed mild intermittent numbness to fingertips after cycle 3 AC. Function is not limited.  No sensory deficits.   -previously reviewed that this can worsen on Taxol/carbo regimen and will monitor closely.  -If neuropathy should get worse on further chemo I previously discussed possible need to reduce dose or amend her treatment plan. She understands. -she has been using ice bag on her hands and feet during chemo infusion, which would help reduce her risk of neuropathy -Her neuropathy has progressed the last few weeks. I recommend she increase her Cymbalta to 10m in the evening. I will refill today.  -i will reduce her carbo/abraxane, if still progresses may hold the last 1-2 treatments.    PLAN:   -I refilled her Cymbalta, increase from [id number] [id number]   -Labs reveiwed and adequate to proceed cycle 10 chemo, will reduced dose Carbo/abraxane  -Please schedule breast [facility] 2 weeks[facility] 6[facility]9 [facility], flush, f/u with lacie and carbo/abraxane next week    No orders of the defined types were placed in this encounter.     All questions were answered. The patient knows to call the clinic with any problems, questions or concerns. I spent 16 minutes counseling the patient  face to face. The total time spent in the appointment was 20 minutes and more than 50% was on counseling.     YanTruitt MerleD 09/09/2017   This document serves as a record of services personally performed by YanTruitt MerleD. It was created on her behalf by AmoJoslyn Devon trained medical scribe. The creation of this record is based on the scribe's personal observations and the provider's statements to them.   I have reviewed the above documentation for accuracy and completeness, and I agree with the above.

## 2017-09-09 ENCOUNTER — Inpatient Hospital Stay: Payer: Medicaid Other

## 2017-09-09 ENCOUNTER — Telehealth: Payer: Self-pay | Admitting: Hematology

## 2017-09-09 ENCOUNTER — Inpatient Hospital Stay (HOSPITAL_BASED_OUTPATIENT_CLINIC_OR_DEPARTMENT_OTHER): Payer: Medicaid Other | Admitting: Hematology

## 2017-09-09 ENCOUNTER — Encounter: Payer: Self-pay | Admitting: Hematology

## 2017-09-09 VITALS — BP 112/87 | HR 107 | Temp 97.9°F | Resp 18 | Ht 64.0 in | Wt 211.7 lb

## 2017-09-09 DIAGNOSIS — C50812 Malignant neoplasm of overlapping sites of left female breast: Secondary | ICD-10-CM

## 2017-09-09 DIAGNOSIS — N951 Menopausal and female climacteric states: Secondary | ICD-10-CM | POA: Diagnosis not present

## 2017-09-09 DIAGNOSIS — T451X5A Adverse effect of antineoplastic and immunosuppressive drugs, initial encounter: Secondary | ICD-10-CM | POA: Diagnosis not present

## 2017-09-09 DIAGNOSIS — C773 Secondary and unspecified malignant neoplasm of axilla and upper limb lymph nodes: Secondary | ICD-10-CM

## 2017-09-09 DIAGNOSIS — G620 Drug-induced polyneuropathy: Secondary | ICD-10-CM

## 2017-09-09 DIAGNOSIS — Z171 Estrogen receptor negative status [ER-]: Secondary | ICD-10-CM

## 2017-09-09 DIAGNOSIS — F419 Anxiety disorder, unspecified: Secondary | ICD-10-CM

## 2017-09-09 DIAGNOSIS — Z95828 Presence of other vascular implants and grafts: Secondary | ICD-10-CM

## 2017-09-09 DIAGNOSIS — Z5111 Encounter for antineoplastic chemotherapy: Secondary | ICD-10-CM | POA: Diagnosis not present

## 2017-09-09 DIAGNOSIS — R52 Pain, unspecified: Secondary | ICD-10-CM

## 2017-09-09 LAB — CMP (CANCER CENTER ONLY)
ALK PHOS: 74 U/L (ref 40–150)
ALT: 48 U/L (ref 0–55)
AST: 31 U/L (ref 5–34)
Albumin: 3.7 g/dL (ref 3.5–5.0)
Anion gap: 6 (ref 3–11)
BUN: 9 mg/dL (ref 7–26)
CALCIUM: 9.3 mg/dL (ref 8.4–10.4)
CO2: 25 mmol/L (ref 22–29)
CREATININE: 0.68 mg/dL (ref 0.60–1.10)
Chloride: 107 mmol/L (ref 98–109)
GFR, Estimated: >60 mL/min (ref >60)
Glucose, Bld: 106 mg/dL (ref 70–140)
Potassium: 4 mmol/L (ref 3.5–5.1)
Sodium: 138 mmol/L (ref 136–145)
Total Bilirubin: 0.7 mg/dL (ref 0.2–1.2)
Total Protein: 6.5 g/dL (ref 6.4–8.3)

## 2017-09-09 LAB — CBC WITH DIFFERENTIAL (CANCER CENTER ONLY)
BASOS ABS: 0 10*3/uL (ref 0.0–0.1)
BASOS PCT: 0 %
EOS ABS: 0 10*3/uL (ref 0.0–0.5)
Eosinophils Relative: 0 %
HEMATOCRIT: 32.3 % — AB (ref 34.8–46.6)
HEMOGLOBIN: 11 g/dL — AB (ref 11.6–15.9)
Lymphocytes Relative: 38 %
Lymphs Abs: 0.9 10*3/uL (ref 0.9–3.3)
MCH: 31.7 pg (ref 25.1–34.0)
MCHC: 34.1 g/dL (ref 31.5–36.0)
MCV: 93.1 fL (ref 79.5–101.0)
MONOS PCT: 12 %
Monocytes Absolute: 0.3 10*3/uL (ref 0.1–0.9)
NEUTROS ABS: 1.1 10*3/uL — AB (ref 1.5–6.5)
NEUTROS PCT: 50 %
Platelet Count: 205 10*3/uL (ref 145–400)
RBC: 3.47 MIL/uL — AB (ref 3.70–5.45)
RDW: 15.2 % — ABNORMAL HIGH (ref 11.2–14.5)
WBC: 2.3 10*3/uL — AB (ref 3.9–10.3)

## 2017-09-09 MED ORDER — SODIUM CHLORIDE 0.9% FLUSH
10.0000 mL | Freq: Once | INTRAVENOUS | Status: AC
  Administered 2017-09-09: 10 mL
  Filled 2017-09-09: qty 10

## 2017-09-09 MED ORDER — SODIUM CHLORIDE 0.9 % IV SOLN
225.0000 mg | Freq: Once | INTRAVENOUS | Status: AC
  Administered 2017-09-09: 230 mg via INTRAVENOUS
  Filled 2017-09-09: qty 23

## 2017-09-09 MED ORDER — ANTICOAGULANT SODIUM CITRATE 4% (200MG/5ML) IV SOLN
5.0000 mL | Freq: Once | Status: AC
  Administered 2017-09-09: 5 mL via INTRAVENOUS
  Filled 2017-09-09: qty 5

## 2017-09-09 MED ORDER — SODIUM CHLORIDE 0.9 % IV SOLN
Freq: Once | INTRAVENOUS | Status: AC
  Administered 2017-09-09: 11:00:00 via INTRAVENOUS

## 2017-09-09 MED ORDER — DEXAMETHASONE SODIUM PHOSPHATE 10 MG/ML IJ SOLN
INTRAMUSCULAR | Status: AC
  Filled 2017-09-09: qty 1

## 2017-09-09 MED ORDER — PACLITAXEL PROTEIN-BOUND CHEMO INJECTION 100 MG
60.0000 mg/m2 | Freq: Once | INTRAVENOUS | Status: AC
  Administered 2017-09-09: 125 mg via INTRAVENOUS
  Filled 2017-09-09: qty 25

## 2017-09-09 MED ORDER — PALONOSETRON HCL INJECTION 0.25 MG/5ML
0.2500 mg | Freq: Once | INTRAVENOUS | Status: AC
  Administered 2017-09-09: 0.25 mg via INTRAVENOUS

## 2017-09-09 MED ORDER — SODIUM CHLORIDE 0.9% FLUSH
10.0000 mL | INTRAVENOUS | Status: DC | PRN
  Administered 2017-09-09: 10 mL
  Filled 2017-09-09: qty 10

## 2017-09-09 MED ORDER — PALONOSETRON HCL INJECTION 0.25 MG/5ML
INTRAVENOUS | Status: AC
Start: 2017-09-09 — End: ?
  Filled 2017-09-09: qty 5

## 2017-09-09 MED ORDER — DULOXETINE HCL 20 MG PO CPEP: 40.0000 mg | ORAL_CAPSULE | Freq: Every day | ORAL | 0 refills | Status: DC

## 2017-09-09 MED ORDER — DEXAMETHASONE SODIUM PHOSPHATE 10 MG/ML IJ SOLN
10.0000 mg | Freq: Once | INTRAMUSCULAR | Status: AC
  Administered 2017-09-09: 10 mg via INTRAVENOUS

## 2017-09-09 NOTE — Progress Notes (Signed)
Dr. Burr Medico okay to tx with ANC 1.1 and HR 107. No echo required at this time.

## 2017-09-09 NOTE — Telephone Encounter (Signed)
Please schedule breast MRI in 2 weeks, 6/9 or 9/10 at [facility] per 5/23 los

## 2017-09-09 NOTE — Patient Instructions (Signed)
[place] Discharge Instructions for Patients Receiving Chemotherapy  Today you received the following chemotherapy agents paclitaxel protein-bound (Abraxane) and Carboplatin  To help prevent nausea and vomiting after your treatment, we encourage you to take your nausea medication as directed If you develop nausea and vomiting that is not controlled by your nausea medication, call the clinic.   BELOW ARE SYMPTOMS THAT SHOULD BE REPORTED IMMEDIATELY:  *FEVER GREATER THAN 100.5 F  *CHILLS WITH OR WITHOUT FEVER  NAUSEA AND VOMITING THAT IS NOT CONTROLLED WITH YOUR NAUSEA MEDICATION  *UNUSUAL SHORTNESS OF BREATH  *UNUSUAL BRUISING OR BLEEDING  TENDERNESS IN MOUTH AND THROAT WITH OR WITHOUT PRESENCE OF ULCERS  *URINARY PROBLEMS  *BOWEL PROBLEMS  UNUSUAL RASH Items with * indicate a potential emergency and should be followed up as soon as possible.  Feel free to call the clinic should you have any questions or concerns. The clinic phone number is [phone].  Please show the [place] at check-in to the Emergency Department and triage nurse.

## 2017-09-10 ENCOUNTER — Telehealth: Payer: Self-pay | Admitting: General Practice

## 2017-09-10 NOTE — Telephone Encounter (Signed)
[facility] CSW Progress Note  Received report from MD that patient will need help w transportation for future appointments per brother.  Spoke w brother - he confirmed that he will be able to transport to next two chemo treatments but will need help w radiation transport when that begins.  CSW will obtain consent from patient to explore transport options at next chemo visit.  Brother asked to request to see social worker during infusion visit.    Edwyna Shell, LCSW Clinical Social Worker Phone:  [phone]

## 2017-09-16 ENCOUNTER — Inpatient Hospital Stay: Payer: Medicaid Other

## 2017-09-16 ENCOUNTER — Encounter: Payer: Self-pay | Admitting: Nurse Practitioner

## 2017-09-16 ENCOUNTER — Inpatient Hospital Stay (HOSPITAL_BASED_OUTPATIENT_CLINIC_OR_DEPARTMENT_OTHER): Payer: Medicaid Other | Admitting: Nurse Practitioner

## 2017-09-16 ENCOUNTER — Telehealth: Payer: Self-pay | Admitting: Nurse Practitioner

## 2017-09-16 ENCOUNTER — Encounter: Payer: Self-pay | Admitting: General Practice

## 2017-09-16 VITALS — BP 120/80 | HR 105 | Temp 97.8°F | Resp 18 | Ht 64.0 in | Wt 215.5 lb

## 2017-09-16 DIAGNOSIS — N939 Abnormal uterine and vaginal bleeding, unspecified: Secondary | ICD-10-CM

## 2017-09-16 DIAGNOSIS — M549 Dorsalgia, unspecified: Secondary | ICD-10-CM

## 2017-09-16 DIAGNOSIS — R51 Headache: Secondary | ICD-10-CM

## 2017-09-16 DIAGNOSIS — M79642 Pain in left hand: Secondary | ICD-10-CM | POA: Diagnosis not present

## 2017-09-16 DIAGNOSIS — Z95828 Presence of other vascular implants and grafts: Secondary | ICD-10-CM

## 2017-09-16 DIAGNOSIS — T451X5A Adverse effect of antineoplastic and immunosuppressive drugs, initial encounter: Secondary | ICD-10-CM

## 2017-09-16 DIAGNOSIS — N951 Menopausal and female climacteric states: Secondary | ICD-10-CM

## 2017-09-16 DIAGNOSIS — D701 Agranulocytosis secondary to cancer chemotherapy: Secondary | ICD-10-CM

## 2017-09-16 DIAGNOSIS — Z171 Estrogen receptor negative status [ER-]: Secondary | ICD-10-CM

## 2017-09-16 DIAGNOSIS — G620 Drug-induced polyneuropathy: Secondary | ICD-10-CM | POA: Diagnosis not present

## 2017-09-16 DIAGNOSIS — R52 Pain, unspecified: Secondary | ICD-10-CM

## 2017-09-16 DIAGNOSIS — F419 Anxiety disorder, unspecified: Secondary | ICD-10-CM

## 2017-09-16 DIAGNOSIS — R42 Dizziness and giddiness: Secondary | ICD-10-CM

## 2017-09-16 DIAGNOSIS — C50812 Malignant neoplasm of overlapping sites of left female breast: Secondary | ICD-10-CM

## 2017-09-16 DIAGNOSIS — C773 Secondary and unspecified malignant neoplasm of axilla and upper limb lymph nodes: Secondary | ICD-10-CM

## 2017-09-16 DIAGNOSIS — M79641 Pain in right hand: Secondary | ICD-10-CM

## 2017-09-16 DIAGNOSIS — R110 Nausea: Secondary | ICD-10-CM

## 2017-09-16 DIAGNOSIS — Z5111 Encounter for antineoplastic chemotherapy: Secondary | ICD-10-CM | POA: Diagnosis not present

## 2017-09-16 LAB — CMP (CANCER CENTER ONLY)
ALBUMIN: 3.5 g/dL (ref 3.5–5.0)
ALK PHOS: 63 U/L (ref 40–150)
ALT: 41 U/L (ref 0–55)
AST: 30 U/L (ref 5–34)
Anion gap: 8 (ref 3–11)
BILIRUBIN TOTAL: 0.5 mg/dL (ref 0.2–1.2)
BUN: 9 mg/dL (ref 7–26)
CALCIUM: 9 mg/dL (ref 8.4–10.4)
CO2: 25 mmol/L (ref 22–29)
CREATININE: 0.72 mg/dL (ref 0.60–1.10)
Chloride: 105 mmol/L (ref 98–109)
GFR, Est AFR Am: >60 mL/min (ref >60)
GFR, Estimated: >60 mL/min (ref >60)
GLUCOSE: 149 mg/dL — AB (ref 70–140)
Potassium: 4 mmol/L (ref 3.5–5.1)
SODIUM: 138 mmol/L (ref 136–145)
Total Protein: 6.1 g/dL — ABNORMAL LOW (ref 6.4–8.3)

## 2017-09-16 LAB — CBC WITH DIFFERENTIAL (CANCER CENTER ONLY)
BASOS PCT: 1 %
Basophils Absolute: 0 10*3/uL (ref 0.0–0.1)
EOS ABS: 0 10*3/uL (ref 0.0–0.5)
EOS PCT: 1 %
HEMATOCRIT: 29.9 % — AB (ref 34.8–46.6)
Hemoglobin: 10.3 g/dL — ABNORMAL LOW (ref 11.6–15.9)
Lymphocytes Relative: 35 %
Lymphs Abs: 0.8 10*3/uL — ABNORMAL LOW (ref 0.9–3.3)
MCH: 32.5 pg (ref 25.1–34.0)
MCHC: 34.4 g/dL (ref 31.5–36.0)
MCV: 94.3 fL (ref 79.5–101.0)
MONO ABS: 0.3 10*3/uL (ref 0.1–0.9)
Monocytes Relative: 11 %
Neutro Abs: 1.1 10*3/uL — ABNORMAL LOW (ref 1.5–6.5)
Neutrophils Relative %: 52 %
PLATELETS: 216 10*3/uL (ref 145–400)
RBC: 3.17 MIL/uL — ABNORMAL LOW (ref 3.70–5.45)
RDW: 15.2 % — AB (ref 11.2–14.5)
WBC Count: 2.2 10*3/uL — ABNORMAL LOW (ref 3.9–10.3)

## 2017-09-16 MED ORDER — SODIUM CHLORIDE 0.9 % IV SOLN
230.0000 mg | Freq: Once | INTRAVENOUS | Status: AC
  Administered 2017-09-16: 230 mg via INTRAVENOUS
  Filled 2017-09-16: qty 23

## 2017-09-16 MED ORDER — SODIUM CHLORIDE 0.9% FLUSH
10.0000 mL | INTRAVENOUS | Status: DC | PRN
  Administered 2017-09-16: 10 mL
  Filled 2017-09-16: qty 10

## 2017-09-16 MED ORDER — PACLITAXEL PROTEIN-BOUND CHEMO INJECTION 100 MG
60.0000 mg/m2 | Freq: Once | INTRAVENOUS | Status: AC
  Administered 2017-09-16: 125 mg via INTRAVENOUS
  Filled 2017-09-16: qty 25

## 2017-09-16 MED ORDER — ANTICOAGULANT SODIUM CITRATE 4% (200MG/5ML) IV SOLN
5.0000 mL | Freq: Once | Status: AC
  Administered 2017-09-16: 5 mL via INTRAVENOUS
  Filled 2017-09-16: qty 5

## 2017-09-16 MED ORDER — SODIUM CHLORIDE 0.9 % IV SOLN
Freq: Once | INTRAVENOUS | Status: AC
  Administered 2017-09-16: 14:00:00 via INTRAVENOUS

## 2017-09-16 MED ORDER — PALONOSETRON HCL INJECTION 0.25 MG/5ML
INTRAVENOUS | Status: AC
  Filled 2017-09-16: qty 5

## 2017-09-16 MED ORDER — PALONOSETRON HCL INJECTION 0.25 MG/5ML
0.2500 mg | Freq: Once | INTRAVENOUS | Status: AC
  Administered 2017-09-16: 0.25 mg via INTRAVENOUS

## 2017-09-16 MED ORDER — SODIUM CHLORIDE 0.9% FLUSH
10.0000 mL | Freq: Once | INTRAVENOUS | Status: AC
  Administered 2017-09-16: 10 mL
  Filled 2017-09-16: qty 10

## 2017-09-16 MED ORDER — DEXAMETHASONE SODIUM PHOSPHATE 10 MG/ML IJ SOLN
10.0000 mg | Freq: Once | INTRAMUSCULAR | Status: AC
  Administered 2017-09-16: 10 mg via INTRAVENOUS

## 2017-09-16 MED ORDER — DEXAMETHASONE SODIUM PHOSPHATE 10 MG/ML IJ SOLN
INTRAMUSCULAR | Status: AC
Start: 2017-09-16 — End: ?
  Filled 2017-09-16: qty 1

## 2017-09-16 NOTE — Progress Notes (Signed)
Per Regan Rakers, NP okay to treat pt with ANC of 1.1

## 2017-09-16 NOTE — Patient Instructions (Signed)
[facility] Discharge Instructions for Patients Receiving Chemotherapy  Today you received the following chemotherapy agents Abraxane and Carboplatin   To help prevent nausea and vomiting after your treatment, we encourage you to take your nausea medication as directed.   If you develop nausea and vomiting that is not controlled by your nausea medication, call the clinic.   BELOW ARE SYMPTOMS THAT SHOULD BE REPORTED IMMEDIATELY:  *FEVER GREATER THAN 100.5 F  *CHILLS WITH OR WITHOUT FEVER  NAUSEA AND VOMITING THAT IS NOT CONTROLLED WITH YOUR NAUSEA MEDICATION  *UNUSUAL SHORTNESS OF BREATH  *UNUSUAL BRUISING OR BLEEDING  TENDERNESS IN MOUTH AND THROAT WITH OR WITHOUT PRESENCE OF ULCERS  *URINARY PROBLEMS  *BOWEL PROBLEMS  UNUSUAL RASH Items with * indicate a potential emergency and should be followed up as soon as possible.  Feel free to call the clinic should you have any questions or concerns. The clinic phone number is [phone].  Please show the CHEMO ALERT CARD at check-in to the Emergency Department and triage nurse.   

## 2017-09-16 NOTE — Progress Notes (Signed)
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 Highly suspicious irregular mass within the left breast at the 4 o'clock axis, 8 cm from the nipple, measuring 5.2 cm, corresponding to the area of clinical concern. Ultrasound-guided biopsy is recommended. 2. Additional suspicious irregular mass within the left breast at the 2 o'clock axis, 8 cm from the nipple, measuring 1.6 cm, corresponding to the mammographic finding. Ultrasound-guided biopsy is recommended. 3. Additional suspicious mass within the left breast at the 2 o'clock axis, 10 cm from the nipple, axillary tail region, measuring 3 cm, suspected lymph node completely replaced by tumor. Ultrasound-guided biopsy is recommended. 4. No evidence of malignancy within the right breast.       04/19/2017 Initial Biopsy    Diagnosis 1. Breast, left, needle core biopsy, 4:00 o'clock, ribbon clip - INVASIVE DUCTAL CARCINOMA. - SEE COMMENT. 2. Breast, left, needle core biopsy, 2:00 o'clock, coil clip - INVASIVE DUCTAL CARCINOMA. - SEE COMMENT. 3. Lymph node, needle/core biopsy, left axilla, spiral hydromark - DUCTAL CARCINOMA. - SEE COMMENT.  2. PROGNOSTIC  INDICATORS Results: IMMUNOHISTOCHEMICAL AND MORPHOMETRIC ANALYSIS PERFORMED MANUALLY Estrogen Receptor: 0%, NEGATIVE Progesterone Receptor: 0%, NEGATIVE Proliferation Marker Ki67: 80%  2. FLUORESCENCE IN-SITU HYBRIDIZATION Results: HER2 - NEGATIVE RATIO OF HER2/CEP17 SIGNALS 1.53 AVERAGE HER2 COPY NUMBER PER CELL 2.75  Microscopic Comment 1. The carcinoma in the three specimens is morphologically similar and is grade III. Lymph nodal tissue is not definitively identified in specimen #3. A breast prognostic profile will be performed on part 2 and the results reported separately.      04/19/2017 Initial Diagnosis    Cancer of overlapping sites of left breast ([place])      04/30/2017 Breast MRI    IMPRESSION: 1. Biopsy-proven invasive carcinoma within the lower outer quadrant of the LEFT breast, 4 o'clock axis, at posterior depth, measuring 4.8 cm, with associated biopsy clip artifact. 2. Biopsy-proven invasive carcinoma within the upper-outer quadrant of the LEFT breast, 2 o'clock axis, at posterior depth, measuring 2.5 cm, with associated biopsy clip artifact. However, contiguous non mass enhancement along the posterior margin of this 2 o'clock mass and extending 3 cm anteriorly from the mass increases the overall measurement to 5.5 cm greatest dimension (AP). 3. Diffuse edema throughout the LEFT breast, with particularly prominent component of edema extending posteriorly to abut the anterior surface of the pectoralis muscle, and diffuse skin thickening throughout the left breast. This almost certainly indicates INFLAMMATORY BREAST CANCER. 4. Biopsy-proven metastatic lymph node within the LEFT axilla measures 4.1 cm. Two additional borderline prominent lymph nodes within the left axilla. No enlarged lymph nodes within the right axilla or internal mammary chain regions. 5. No evidence of malignancy within the RIGHT breast.  RECOMMENDATION: Per current treatment plan for  patient's known left  breast cancer (2 biopsy-proven sites) and metastatic lymph node in the left axilla.  BI-RADS CATEGORY  6: Known biopsy-proven malignancy.       04/30/2017 Echocardiogram    Study Conclusions  - Left ventricle: The cavity size was normal. Wall thickness was   normal. Systolic function was normal. The estimated ejection   fraction was in the range of 60% to 65%. Wall motion was normal;   there were no regional wall motion abnormalities. Left   ventricular diastolic function parameters were normal. GLS:   -20.4%, RV S vel 12.1cm/s.      05/05/2017 Imaging    MRI BRAIN IMPRESSION: No intracranial or calvarial metastatic disease. Normal MRI of the brain.      05/07/2017 Imaging    CT CAP IMPRESSION: Two left breast masses and a grossly abnormal 3.9 cm lower left axillary lymph node, likely correspond to multifocal left breast cancer metastatic to the left axilla. Skin thickening of the left breast, raising concern for an inflammatory breast cancer.  1 cm hypoattenuated lesion in the posterior dome of the liver with internal attenuation slightly higher than water. This may represent a complicated liver cyst, hemangioma or a focus of metastatic disease.  No other findings to suggest distal metastatic disease.       05/07/2017 Imaging    BONE SCAN IMPRESSION: 1. No evidence of osseous metastases. 2. Abnormal uptake in the soft tissues of the left breast likely by the patient's known breast cancer.       05/11/2017 Genetic Testing    Common Cancers panel (47 genes) @ [facility] - No pathoagenic mutations detected Five Variants of Uncertain Significance were detected:  APC c.2438A>G (p.Asn813Ser)  BARD1 c.782T>C (p.Leu261Pro) MSH3 c.1655C>T (p.Thr552Ile)  MSH6 c.2108T>C (p.Met703Thr)  NTHL1 c.470G>C (p.Arg157Pro)   Genes Analyzed: 47 genes on [facility]'s Common Cancers panel (APC, ATM, AXIN2, BARD1, BMPR1A, BRCA1, BRCA2, BRIP1, CDH1, CDK4, CDKN2A,  CHEK2, CTNNA1, DICER1, EPCAM, GREM1, HOXB13, KIT, MEN1, MLH1, MSH2, MSH3, MSH6, MUTYH, NBN, NF1, NTHL1, PALB2, PDGFRA, PMS2, POLD1, POLE, PTEN, RAD50, RAD51C, RAD51D, SDHA, SDHB, SDHC, SDHD, SMAD4, SMARCA4, STK11, TP53, TSC1, TSC2, VHL).       05/12/2017 -  Chemotherapy    1. Neoadjuvant AC q2 weeks x4 cycles then weekly 05/12/17 - 06/24/17 followed by carbo/taxol for 12 weeks starting 07/08/17. Due to poor toleration, changed Taxol to abraxane and added Carboplatin with cycle 2.  2. Monthly zoladex injections starting 04/30/17 for the duration of chemotherapy.       08/06/2017 Mammogram    The previously demonstrated rounded mass in the 2 o'clock position of the left breast, containing a coil shaped biopsy marker clip, is also significantly smaller, less well-defined and has interspersed fat. This currently measures 0.7 x 0.5 x 0.4 cm in maximum dimensions, previously 1.8 x 1.5 x 1.3 cm mammographically.  IMPRESSION: 1. No evidence of malignancy at the location of recent palpable concern in the upper outer left breast. 2. Marked decrease in size and conspicuity of the biopsy-proven invasive ductal carcinomas in the 2 o'clock and 4 o'clock positions of the left breast with no discrete residual masses. There is only mild ill-defined residual soft tissue density and interspersed fat at those locations and associated architectural distortion in the 4 o'clock position. 3. Complete imaging resolution of the previously biopsied metastatic left inferior axillary lymph node.     CURRENT THERAPY:   1. Neoadjuvant AC q2 weeks x4 cycles then weekly 05/12/17 - 06/24/17 followed by carbo/taxol for 12 weeks starting 07/08/17.  Due to poor toleration, changed Taxol to abraxane and added Carboplatin with cycle 2. Dose reduced Abraxane and Carboplatin due to neuropathy starting with cycle 10.  2. Monthly Zoladex injections starting 04/30/17 for the duration of chemotherapy.     INTERVAL HISTORY: Ms. Kadel returns for  follow up and cycle 11 weekly abraxane/carbo as scheduled. She has increased body aches, especially back pain, and neuropathy in her hands from previous cycle. She increased cymbalta per Dr. Burr Medico which has helped some. She has difficulty with tasks using her hands. Pain in her hands is 6/10. She has mild nausea but doesn't always require anti-emetics, she is still able to eat and drink well. Has good appetite. No fever, chills, cough, chest pain, or dyspnea.   REVIEW OF SYSTEMS:   Constitutional: Denies fevers, chills or abnormal weight loss Eyes: Denies blurriness of vision Ears, nose, mouth, throat, and face: Denies mucositis or sore throat Respiratory: Denies cough, dyspnea or wheezes Cardiovascular: Denies palpitation, chest discomfort or lower extremity swelling Gastrointestinal:  Denies vomiting, constipation, diarrhea, heartburn or change in bowel habits (+) mild nausea  Skin: Denies abnormal skin rashes (+) nail darkening  Lymphatics: Denies new lymphadenopathy or easy bruising Neurological:Denies dizziness, headaches, new weaknesses (+) neuropathy to hands, increased  Behavioral/Psych: Mood is stable, no new changes  MSK: (+) body pain, increased back pain  All other systems were reviewed with the patient and are negative.  MEDICAL HISTORY:  Past Medical History:  Diagnosis Date  . Breast cancer ([place]) left  . Family history of cancer   . Genetic testing 04/29/2017   Common Cancers panel (47 genes) @ [facility] - No pathoagenic mutations detected    SURGICAL HISTORY: Past Surgical History:  Procedure Laterality Date  . IR FLUORO GUIDE PORT INSERTION RIGHT  05/03/2017  . IR US GUIDE VASC ACCESS RIGHT  05/03/2017    I have reviewed the social history and family history with the patient and they are unchanged from previous note.  ALLERGIES:  is allergic to heparin.  MEDICATIONS:  Current Outpatient Medications  Medication Sig Dispense Refill  . acetaminophen-codeine (TYLENOL  #3) 300-30 MG tablet Take 1 tablet by mouth every 6 (six) hours as needed for moderate pain. 30 tablet 0  . ALPRAZolam (XANAX) 0.25 MG tablet Take 1 tablet (0.25 mg total) by mouth at bedtime as needed for anxiety. 30 tablet 0  . DULoxetine (CYMBALTA) 20 MG capsule Take 2 capsules (40 mg total) by mouth daily. 60 capsule 0  . ibuprofen (ADVIL,MOTRIN) 200 MG tablet Take 2 tablets (400 mg total) by mouth every 6 (six) hours as needed. 30 tablet 0  . lidocaine-prilocaine (EMLA) cream Apply to affected area once 30 g 3  . LORazepam (ATIVAN) 0.5 MG tablet Take 1 tablet (0.5 mg total) by mouth every 8 (eight) hours as needed (nausea, vomiting). 20 tablet 0  . ondansetron (ZOFRAN) 8 MG tablet Take 1 tablet (8 mg total) by mouth 2 (two) times daily as needed. Start on the third day after chemotherapy. 30 tablet 1  . prochlorperazine (COMPAZINE) 10 MG tablet Take 1 tablet (10 mg total) by mouth every 6 (six) hours as needed (Nausea or vomiting). 30 tablet 1   No current facility-administered medications for this visit.     PHYSICAL EXAMINATION: ECOG PERFORMANCE STATUS: 1 - Symptomatic but completely ambulatory  Vitals:   09/16/17 1258  BP: 120/80  Pulse: (!) 105  Resp: 18  Temp: 97.8 F (36.6 C)  SpO2: 100%   Filed Weights  09/16/17 1258  Weight: 215 lb 8 oz (97.8 kg)    GENERAL:alert, no distress and comfortable SKIN: no rashes or significant lesions. Hyperpigmentation to nailbeds EYES: normal, Conjunctiva are pink and non-injected, sclera clear OROPHARYNX:no thrush or ulcers. Hyperpigmented tongue  NECK: supple, thyroid normal size, non-tender, without nodularity LYMPH:  no palpable cervical or supraclavicular lymphadenopathy  LUNGS: clear to auscultation with normal breathing effort HEART: regular rate & rhythm and no murmurs and no lower extremity edema ABDOMEN:abdomen soft, non-tender and normal bowel sounds Musculoskeletal:no cyanosis of digits and no clubbing  NEURO: alert &  oriented x 3 with fluent speech. Mildly decreased vibratory sense to fingertips per tuning fork exam  BREASTS: inspection shows them to be symmetrical without nipple inversion or discharge. No palpable mass in right breast or axilla that I could appreciate. (+) left breast with similar soft tissue thickness in upper central to upper outer breast without discrete mass. No palpable left axillary adenopathy.  PAC without erythema   LABORATORY DATA:  I have reviewed the data as listed CBC Latest Ref Rng & Units 09/16/2017 09/09/2017 09/02/2017  WBC 3.9 - 10.3 K/uL 2.2(L) 2.3(L) 1.9(L)  Hemoglobin 11.6 - 15.9 g/dL 10.3(L) 11.0(L) 12.0  Hematocrit 34.8 - 46.6 % 29.9(L) 32.3(L) 34.9  Platelets 145 - 400 K/uL 216 205 191     CMP Latest Ref Rng & Units 09/16/2017 09/09/2017 09/02/2017  Glucose 70 - 140 mg/dL 149(H) 106 102  BUN 7 - 26 mg/dL 9 9 12   Creatinine 0.60 - 1.10 mg/dL 0.72 0.68 0.67  Sodium 136 - 145 mmol/L 138 138 140  Potassium 3.5 - 5.1 mmol/L 4.0 4.0 4.1  Chloride 98 - 109 mmol/L 105 107 109  CO2 22 - 29 mmol/L 25 25 23   Calcium 8.4 - 10.4 mg/dL 9.0 9.3 9.1  Total Protein 6.4 - 8.3 g/dL 6.1(L) 6.5 6.5  Total Bilirubin 0.2 - 1.2 mg/dL 0.5 0.7 0.4  Alkaline Phos 40 - 150 U/L 63 74 76  AST 5 - 34 U/L 30 31 37(H)  ALT 0 - 55 U/L 41 48 46   PATHOLOGY  Diagnosis 05/10/17 1. Cervix, biopsy, 11 o'clock - ATYPICAL SQUAMOUS METAPLASIA ASSOCIATED WITH INFLAMMATION. - SEE COMMENT. 2. Endocervix, curettage - DETACHED FRAGMENTS OF SQUAMOUS MUCOSA WITH SLIGHT ATYPIA. - BENIGN ENDOCERVICAL MUCOSA. - SEE COMMENT. Microscopic Comment 1. The sections show multiple fragments of mostly metaplastic squamous mucosa displaying prominent chronic and acute inflammation to a lesser extent. This is associated with epithelial atypia in the form of nuclear enlargement, and small nucleoli. The inflammatory process obscures the epithelium which is somewhat difficult to evaluate. A p16 stain was performed  and is negative. Overall, definitive or diagnostic squamous intraepithelial lesion is not appreciated in this setting, and the changes may represent a florid reactive state. Nonetheless, clinical correlation and follow up is strongly recommended. 2. The sections show multiple detached fragments of squamous mucosa, some of which display slight atypia. This is admixed with multiple fragments of benign endocervical mucosa. A p16 stain was performed and is negative. The overall changes are not specific or diagnostic of a squamous intraepithelial lesion. Clinical correlation and follow up is recommended. (BNS:ah 05/12/17)  GYNECOLOGIC CYTOLOGY REPORT Adequacy Reason 05/10/17 Satisfactory for evaluation, endocervical/transformation zone component PRESENT. Diagnosis NEGATIVE FOR INTRAEPITHELIAL LESIONS OR MALIGNANCY. BENIGN REACTIVE/REPARATIVE CHANGES.    Diagnosis 04/19/17 1. Breast, left, needle core biopsy, 4:00 o'clock, ribbon clip - INVASIVE DUCTAL CARCINOMA. - SEE COMMENT. 2. Breast, left, needle core biopsy, 2:00 o'clock, coil clip -  INVASIVE DUCTAL CARCINOMA. - SEE COMMENT. 3. Lymph node, needle/core biopsy, left axilla, spiral hydromark - DUCTAL CARCINOMA. - SEE COMMENT. Microscopic Comment 1. The carcinoma in the three specimens is morphologically similar and is grade III. Lymph nodal tissue is not definitively identified in specimen #3. A breast prognostic profile will be performed on part 2 and the results reported separately. 2. FLUORESCENCE IN-SITU HYBRIDIZATION Results: HER2 - NEGATIVE RATIO OF HER2/CEP17 SIGNALS 1.53 AVERAGE HER2 COPY NUMBER PER CELL 2.75 2. PROGNOSTIC INDICATORS Results: IMMUNOHISTOCHEMICAL AND MORPHOMETRIC ANALYSIS PERFORMED MANUALLY Estrogen Receptor: 0%, NEGATIVE Progesterone Receptor: 0%, NEGATIVE Proliferation Marker Ki67: 80% COMMENT: The negative hormone receptor study(ies) in this case has An internal positive  control.    RADIOGRAPHIC STUDIES: I have personally reviewed the radiological images as listed and agreed with the findings in the report. No results found.   ASSESSMENT & PLAN: Carrie Miller a 22 y.o. 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 Headaches, dizziness 3. Anxiety 4. Social issues 5. Genetics - negative for pathogenic mutation per 05/11/17 report  6. Fertility planning  7. Irregular vaginal bleeding  8. Hot flashes, secondary to zoladex and chemotherapy  9. Peripheral neuropathy, G2, secondary to chemotherapy  10. Chemotherapy-induced neutropenia   Ms. Achey appears stable. She completed 10 cycles neoadjuvant abraxane/carboplatin. She is tolerating treatment moderately well, but has increased neuropathy and body pain. I encouraged her to continue 40 mg cymbalta and ice soaks during chemotherapy. Breast exam is stable. Labs reviewed, she remains neutropenic with stable ANC 1.1; otherwise labs adequate for treatment. I reviewed with Dr. Burr Medico; due to worsening neuropathy and body pain, she recommends to proceed with cycle 11 today and then discontinue chemo. Will arrange breast MRI next week and she will f/u with Dr. Barry Dienes for surgical planning appointment. Will follow her surgery date and see her back to discuss final path soon after surgery.   PLAN -Labs reviewed, proceed with cycle 11 abraxane/carbo at current doses -Discontinue chemo after today's cycle, cancel cycle 12 due to increased neuropathy  -MRI next week -F/u with Dr. Barry Dienes after MRI to plan breast surgery; message sent to Dr. Barry Dienes with update -F/u open, pending surgery  All questions were  answered. The patient knows to call the clinic with any problems, questions or concerns. No barriers to learning was detected. I spent 20 minutes counseling the patient face to face. The total time spent in the appointment was 25 minutes and more than 50% was on counseling, review of test results, and coordination of care.      [name], NP 09/16/17

## 2017-09-16 NOTE — Progress Notes (Signed)
[place] CSW Progress Note  Per brother, patient will need help w transport to radiation treatments when scheduled.  Discussed options w patient while in infusion room.  Pt would like to be linked w available resources and has signed consent.  Edwyna Shell, LCSW Clinical Social Worker Phone:  [phone]

## 2017-09-16 NOTE — Telephone Encounter (Signed)
Cancelled appt per 5/30 los. Pt aware.

## 2017-09-23 ENCOUNTER — Ambulatory Visit (HOSPITAL_COMMUNITY)
Admission: RE | Admit: 2017-09-23 | Discharge: 2017-09-23 | Disposition: A | Discharge status: Home / Self Care | Payer: Medicaid Other | Source: Ambulatory Visit | Attending: Nurse Practitioner | Admitting: Nurse Practitioner

## 2017-09-23 ENCOUNTER — Other Ambulatory Visit: Payer: Medicaid Other

## 2017-09-23 ENCOUNTER — Ambulatory Visit: Payer: Medicaid Other | Admitting: Hematology

## 2017-09-23 ENCOUNTER — Ambulatory Visit: Payer: Medicaid Other

## 2017-09-23 DIAGNOSIS — C50912 Malignant neoplasm of unspecified site of left female breast: Secondary | ICD-10-CM | POA: Diagnosis not present

## 2017-09-23 DIAGNOSIS — Z08 Encounter for follow-up examination after completed treatment for malignant neoplasm: Secondary | ICD-10-CM | POA: Insufficient documentation

## 2017-09-23 DIAGNOSIS — D493 Neoplasm of unspecified behavior of breast: Secondary | ICD-10-CM

## 2017-09-23 DIAGNOSIS — Z9221 Personal history of antineoplastic chemotherapy: Secondary | ICD-10-CM | POA: Insufficient documentation

## 2017-09-23 DIAGNOSIS — C773 Secondary and unspecified malignant neoplasm of axilla and upper limb lymph nodes: Secondary | ICD-10-CM | POA: Diagnosis not present

## 2017-09-23 MED ORDER — GADOBENATE DIMEGLUMINE 529 MG/ML IV SOLN
20.0000 mL | Freq: Once | INTRAVENOUS | Status: AC | PRN
  Administered 2017-09-23: 20 mL via INTRAVENOUS

## 2017-09-26 ENCOUNTER — Encounter (HOSPITAL_COMMUNITY): Payer: Self-pay | Admitting: Emergency Medicine

## 2017-09-26 ENCOUNTER — Emergency Department (HOSPITAL_COMMUNITY)
Admission: EM | Admit: 2017-09-26 | Discharge: 2017-09-26 | Disposition: A | Discharge status: Home / Self Care | Payer: Medicaid Other | Attending: Emergency Medicine | Admitting: Emergency Medicine

## 2017-09-26 ENCOUNTER — Other Ambulatory Visit: Payer: Self-pay

## 2017-09-26 DIAGNOSIS — Z79899 Other long term (current) drug therapy: Secondary | ICD-10-CM | POA: Diagnosis not present

## 2017-09-26 DIAGNOSIS — Z760 Encounter for issue of repeat prescription: Secondary | ICD-10-CM | POA: Diagnosis not present

## 2017-09-26 DIAGNOSIS — M7918 Myalgia, other site: Secondary | ICD-10-CM | POA: Insufficient documentation

## 2017-09-26 DIAGNOSIS — Z853 Personal history of malignant neoplasm of breast: Secondary | ICD-10-CM | POA: Diagnosis not present

## 2017-09-26 MED ORDER — ACETAMINOPHEN-CODEINE #3 300-30 MG PO TABS: 1.0000 | ORAL_TABLET | Freq: Three times a day (TID) | ORAL | 0 refills | Status: DC | PRN

## 2017-09-26 NOTE — ED Triage Notes (Signed)
Pt reports that everyday for past 3 weeks has generalized body pains from head to feet and numbness in all fingers.

## 2017-09-26 NOTE — Discharge Instructions (Addendum)
Please return for any problem.  Follow-up with your regular oncologist tomorrow morning for additional refills on your pain medication.

## 2017-09-26 NOTE — ED Provider Notes (Signed)
[facility] DEPT Provider Note   CSN: [id number] Arrival date & time: 09/26/17  1141     History   Chief Complaint Chief Complaint  Patient presents with  . body pains  . finger numbness    HPI Carrie Miller is a 22 y.o. female.  22 year old female with prior significant history of breast cancer who is currently completing chemo with Dr. Burr Medico presents for evaluation of her neuropathic pain.  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  Patient reports that she has run out of her Tylenol #3.  This is 1 of the medications given to treat her neuropathic pain.  Patient does not appear to be in distress at this time.  She reports that after running out of her medication 3 days ago her pain is increased.  She denies associated fever, focal weakness, nausea, vomiting, chest pain, shortness of breath, or other complaint.  The history is provided by the patient and medical records.  Medication Refill  Medications/supplies requested:  Tylenol #3 Reason for request:  Medications ran out   Past Medical History:  Diagnosis Date  . Breast cancer ([place]) left  . Family history of cancer   . Genetic testing 04/29/2017   Common Cancers panel (47 genes) @ [facility] - No pathoagenic mutations detected    Patient Active Problem List   Diagnosis Date Noted  . Anemia due to antineoplastic chemotherapy 09/01/2017  . 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 Port-A-Cath in place 05/12/2017  . Genetic testing 04/29/2017  . Family history of cancer   . Cancer of overlapping sites of left breast ([place]) 04/28/2017    Past Surgical History:  Procedure Laterality Date  . IR FLUORO GUIDE PORT INSERTION RIGHT  05/03/2017  . IR US GUIDE VASC ACCESS RIGHT  05/03/2017     OB History    Gravida  0   Para  0   Term  0   Preterm  0   AB  0   Living  0     SAB  0   TAB  0   Ectopic  0   Multiple  0   Live Births  0            Home Medications    Prior to Admission medications   Medication Sig Start Date End Date Taking? Authorizing Provider  acetaminophen-codeine (TYLENOL #3) 300-30 MG tablet Take 1 tablet by mouth every 6 (six) hours as needed for moderate pain. 08/19/17   [name], NP  ALPRAZolam Duanne Moron) 0.25 MG tablet Take 1 tablet (0.25 mg total) by mouth at bedtime as needed for anxiety. 08/19/17   [name], NP  DULoxetine (CYMBALTA) 20 MG capsule Take 2 capsules (40 mg total) by mouth daily. 09/09/17   [name], MD  ibuprofen (ADVIL,MOTRIN) 200 MG tablet Take 2 tablets (400 mg total) by mouth every 6 (six) hours as needed. 03/31/17   Zigmund Gottron, NP  lidocaine-prilocaine (EMLA) cream Apply to affected area once 04/29/17   [name], MD  LORazepam (ATIVAN) 0.5 MG tablet Take 1 tablet (0.5 mg total) by mouth every 8 (eight) hours as needed (nausea, vomiting). 06/24/17   [name], NP  ondansetron (ZOFRAN) 8 MG tablet Take 1 tablet (8 mg total) by mouth 2 (two) times daily as needed. Start on the third day after chemotherapy. 04/29/17   [name], MD  prochlorperazine (COMPAZINE) 10 MG tablet Take 1  tablet (10 mg total) by mouth every 6 (six) hours as needed (Nausea or vomiting). 08/19/17   [name], NP    Family History Family History  Problem Relation Age of Onset  . Hyperlipidemia Mother   . Hypertension Mother   . Prostate cancer Father 49       currently 11  . Stomach cancer Paternal Aunt 40       deceased 11s  . Prostate cancer Paternal Uncle 42       deceased 65    Social History Social History   Tobacco Use  . Smoking status: Never Smoker  . Smokeless tobacco: Never Used  Substance Use Topics  . Alcohol use: No    Frequency: Never  . Drug use: No     Allergies   Heparin   Review of Systems Review of Systems  Neurological:       Complains of neuropathic pain to her extremities.  All other systems reviewed and  are negative.    Physical Exam Updated Vital Signs BP (!) 146/95 (BP Location: Left Arm)   Pulse (!) 104   Temp 97.8 F (36.6 C) (Oral)   Resp 18   Ht 5\' 4"  (1.626 m)   Wt 97.8 kg (215 lb 8 oz)   SpO2 100%   BMI 36.99 kg/m   Physical Exam  Constitutional: She is oriented to person, place, and time. She appears well-developed and well-nourished. No distress.  HENT:  Head: Normocephalic and atraumatic.  Mouth/Throat: Oropharynx is clear and moist.  Eyes: Pupils are equal, round, and reactive to light. Conjunctivae and EOM are normal.  Neck: Normal range of motion. Neck supple.  Cardiovascular: Normal rate, regular rhythm and normal heart sounds.  Pulmonary/Chest: Effort normal and breath sounds normal. No respiratory distress.  Abdominal: Soft. She exhibits no distension. There is no tenderness.  Musculoskeletal: Normal range of motion. She exhibits no edema or deformity.  Neurological: She is alert and oriented to person, place, and time.  Skin: Skin is warm and dry.  Psychiatric: She has a normal mood and affect.  Nursing note and vitals reviewed.    ED Treatments / Results  Labs (all labs ordered are listed, but only abnormal results are displayed) Labs Reviewed - No data to display  EKG None  Radiology No results found.  Procedures Procedures (including critical care time)  Medications Ordered in ED Medications - No data to display   Initial Impression / Assessment and Plan / ED Course  I have reviewed the triage vital signs and the nursing notes.  Pertinent labs & imaging results that were available during my care of the patient were reviewed by me and considered in my medical decision making (see chart for details).     MDM  Screen complete  Patient is presenting for evaluation of her neuropathic pain and requests refill on her Tylenol #3.  She reports that she ran out of this narcotic 3 days prior.  She is followed by Dr. Burr Medico for oncology.  She  has no other acute complaint.  No further work-up indicated in the ED.  Case discussed with Dr. Alvy Bimler who agrees with ED management.  Understands need for close follow-up with her primary oncologist Dr. Burr Medico.  Strict return precautions are given and understood.   Final Clinical Impressions(s) / ED Diagnoses   Final diagnoses:  Medication refill    ED Discharge Orders        Ordered    acetaminophen-codeine (TYLENOL #3) 300-30  MG tablet  Every 8 hours PRN     09/26/17 1236       Valarie Merino, MD 09/26/17 1237

## 2017-09-27 ENCOUNTER — Telehealth: Payer: Self-pay | Admitting: Hematology

## 2017-09-27 NOTE — Telephone Encounter (Signed)
Spoke with patient's husband regarding appointment per 6/9 sch msg

## 2017-09-28 NOTE — Progress Notes (Addendum)
Carrie Miller  [phone] [phone]  Clinic Follow Up Note   Patient Care Team: Patient, No Pcp Per as PCP - General ([place]) [name], MD as Consulting Physician (General Surgery) [name], MD as Consulting Physician (Hematology) [name], NP as Nurse Practitioner (Nurse Practitioner)   Date of Service:  09/29/2017  CHIEF COMPLAINTS:  Follow up left breast cancer, triple negative   Oncology History   Cancer Staging Cancer of overlapping sites of left breast [facility]) Staging form: Breast, AJCC 8th Edition - Clinical stage from 04/19/2017: Stage IIIC (cT3(m), cN1, cM0, G3, ER: Negative, PR: Negative, HER2: Negative) - Signed by [name], MD on 04/28/2017 - Pathologic: No stage assigned - Unsigned       Cancer of overlapping sites of left breast ([place])   04/15/2017 Mammogram    IMPRESSION: 1. Highly suspicious irregular mass within the left breast at the 4 o'clock axis, 8 cm from the nipple, measuring 5.2 cm, corresponding to the area of clinical concern. Ultrasound-guided biopsy is recommended. 2. Additional suspicious irregular mass within the left breast at the 2 o'clock axis, 8 cm from the nipple, measuring 1.6 cm, corresponding to the mammographic finding. Ultrasound-guided biopsy is recommended. 3. Additional suspicious mass within the left breast at the 2 o'clock axis, 10 cm from the nipple, axillary tail region, measuring 3 cm, suspected lymph node completely replaced by tumor. Ultrasound-guided biopsy is recommended. 4. No evidence of malignancy within the right breast.       04/19/2017 Initial Biopsy    Diagnosis 1. Breast, left, needle core biopsy, 4:00 o'clock, ribbon clip - INVASIVE DUCTAL CARCINOMA. - SEE COMMENT. 2. Breast, left, needle core biopsy, 2:00 o'clock, coil clip - INVASIVE DUCTAL CARCINOMA. - SEE COMMENT. 3. Lymph node, needle/core biopsy, left axilla, spiral hydromark - DUCTAL CARCINOMA. -  SEE COMMENT.  2. PROGNOSTIC INDICATORS Results: IMMUNOHISTOCHEMICAL AND MORPHOMETRIC ANALYSIS PERFORMED MANUALLY Estrogen Receptor: 0%, NEGATIVE Progesterone Receptor: 0%, NEGATIVE Proliferation Marker Ki67: 80%  2. FLUORESCENCE IN-SITU HYBRIDIZATION Results: HER2 - NEGATIVE RATIO OF HER2/CEP17 SIGNALS 1.53 AVERAGE HER2 COPY NUMBER PER CELL 2.75  Microscopic Comment 1. The carcinoma in the three specimens is morphologically similar and is grade III. Lymph nodal tissue is not definitively identified in specimen #3. A breast prognostic profile will be performed on part 2 and the results reported separately.      04/19/2017 Initial Diagnosis    Cancer of overlapping sites of left breast ([place])      04/30/2017 Breast MRI    IMPRESSION: 1. Biopsy-proven invasive carcinoma within the lower outer quadrant of the LEFT breast, 4 o'clock axis, at posterior depth, measuring 4.8 cm, with associated biopsy clip artifact. 2. Biopsy-proven invasive carcinoma within the upper-outer quadrant of the LEFT breast, 2 o'clock axis, at posterior depth, measuring 2.5 cm, with associated biopsy clip artifact. However, contiguous non mass enhancement along the posterior margin of this 2 o'clock mass and extending 3 cm anteriorly from the mass increases the overall measurement to 5.5 cm greatest dimension (AP). 3. Diffuse edema throughout the LEFT breast, with particularly prominent component of edema extending posteriorly to abut the anterior surface of the pectoralis muscle, and diffuse skin thickening throughout the left breast. This almost certainly indicates INFLAMMATORY BREAST CANCER. 4. Biopsy-proven metastatic lymph node within the LEFT axilla measures 4.1 cm. Two additional borderline prominent lymph nodes within the left axilla. No enlarged lymph nodes within the right axilla or internal mammary chain regions. 5. No evidence of malignancy within  the RIGHT breast.  RECOMMENDATION: Per  current treatment plan for patient's known left breast cancer (2 biopsy-proven sites) and metastatic lymph node in the left axilla.  BI-RADS CATEGORY  6: Known biopsy-proven malignancy.       04/30/2017 Echocardiogram    Study Conclusions  - Left ventricle: The cavity size was normal. Wall thickness was   normal. Systolic function was normal. The estimated ejection   fraction was in the range of 60% to 65%. Wall motion was normal;   there were no regional wall motion abnormalities. Left   ventricular diastolic function parameters were normal. GLS:   -20.4%, RV S vel 12.1cm/s.      05/05/2017 Imaging    MRI BRAIN IMPRESSION: No intracranial or calvarial metastatic disease. Normal MRI of the brain.      05/07/2017 Imaging    CT CAP IMPRESSION: Two left breast masses and a grossly abnormal 3.9 cm lower left axillary lymph node, likely correspond to multifocal left breast cancer metastatic to the left axilla. Skin thickening of the left breast, raising concern for an inflammatory breast cancer.  1 cm hypoattenuated lesion in the posterior dome of the liver with internal attenuation slightly higher than water. This may represent a complicated liver cyst, hemangioma or a focus of metastatic disease.  No other findings to suggest distal metastatic disease.       05/07/2017 Imaging    BONE SCAN IMPRESSION: 1. No evidence of osseous metastases. 2. Abnormal uptake in the soft tissues of the left breast likely by the patient's known breast cancer.       05/11/2017 Genetic Testing    Common Cancers panel (47 genes) @ [facility] - No pathoagenic mutations detected Five Variants of Uncertain Significance were detected:  APC c.2438A>G (p.Asn813Ser)  BARD1 c.782T>C (p.Leu261Pro) MSH3 c.1655C>T (p.Thr552Ile)  MSH6 c.2108T>C (p.Met703Thr)  NTHL1 c.470G>C (p.Arg157Pro)   Genes Analyzed: 47 genes on [facility]'s Common Cancers panel (APC, ATM, AXIN2, BARD1, BMPR1A, BRCA1, BRCA2,  BRIP1, CDH1, CDK4, CDKN2A, CHEK2, CTNNA1, DICER1, EPCAM, GREM1, HOXB13, KIT, MEN1, MLH1, MSH2, MSH3, MSH6, MUTYH, NBN, NF1, NTHL1, PALB2, PDGFRA, PMS2, POLD1, POLE, PTEN, RAD50, RAD51C, RAD51D, SDHA, SDHB, SDHC, SDHD, SMAD4, SMARCA4, STK11, TP53, TSC1, TSC2, VHL).       05/12/2017 - 09/16/2017 Chemotherapy    1. Neoadjuvant AC q2 weeks x4 cycles then weekly 05/12/17 - 06/24/17 followed by carbo/taxol for 12 weeks starting 07/08/17. Due to poor toleration, changed Taxol to abraxane and added Carboplatin with cycle 2.  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  2. Monthly zoladex injections starting 04/30/17 for the duration of chemotherapy.       08/06/2017 Mammogram    The previously demonstrated rounded mass in the 2 o'clock position of the left breast, containing a coil shaped biopsy marker clip, is also significantly smaller, less well-defined and has interspersed fat. This currently measures 0.7 x 0.5 x 0.4 cm in maximum dimensions, previously 1.8 x 1.5 x 1.3 cm mammographically.  IMPRESSION: 1. No evidence of malignancy at the location of recent palpable concern in the upper outer left breast. 2. Marked decrease in size and conspicuity of the biopsy-proven invasive ductal carcinomas in the 2 o'clock and 4 o'clock positions of the left breast with no discrete residual masses. There is only mild ill-defined residual soft tissue density and interspersed fat at those locations and associated architectural distortion in the 4 o'clock position. 3. Complete imaging resolution of the previously biopsied metastatic left inferior axillary lymph node.  09/23/2017 Imaging    Repeated breast MRI showed 1. Significant positive imaging response to chemotherapy with resolution of the previously seen biopsy-proven malignancy over the 4 o'clock position of the left breast with only subtle residual non mass enhancement in this location measuring 2.0 x 0.9 x 1.5 cm. 2. Complete imaging response with resolution  of the biopsy-proven malignancy over the 2 o'clock position of the left breast. 3. Complete resolution of the previously seen diffuse left breast edema and skin thickening. 4. Near complete resolution of the previously biopsy-proven metastatic left axillary lymph node.       HISTORY OF PRESENTING ILLNESS: 04/28/17 Ardath Sax 22 y.o. female is here because of newly diagnosed left breast cancer. She was referred by Dr. Barry Dienes. She presents with her brother and his wife; she speaks minimal [place], her family interpreted for her as she refused interpreter services. She initially palpated a left breast mass 6 weeks ago that was painful with breast swelling and felt to be enlarging. Has not had prior mammogram. Denies nipple inversion, discharge, or skin dimpling. She went to [facility] urgent care and was then referred to the breast center for imaging and diagnostics. Diagnostic mammogram showed a dominant mass in the left breast at the 4 o'clock axis, 8 cm from the nipple, measuring 5.2 x 4.3 x 4.3 cm; a satellite mass in the left breast at the 2 o'clock axis, 8 cm from the nipple, measuring 1.6 x 1.3 x 1.3 cm; and a circumscribed hypoechoic mass in the left breast at the 2 o'clock axis, 10 cm from the nipple, axillary tail measuring 3 x 2.3 x 2.8 cm, suspected lymph node completely replaced by tumor.  Ultrasound-guided biopsy of all 3 masses were positive for invasive ductal carcinoma, ER/PR negative, HER-2 negative, grade 3.    In addition she reports 1 month history of frequent daily headaches with associated dizziness and occasional left eye pain. Pain often wakes her up from sleep, has tried ibuprofen with some relief. Pain usually at the crown of her head, average 5/10 - 9/10 on pain scale. Not sensitive to light or sound. Denies fall. Reports fatigue for 1 week, denies weight loss, decreased appetite, abdominal pain. Over last 2 months she has irregular vaginal bleeding, bleeding 20 days out of the  last month. Recently had abnormal PAP smear, colposcopy is pending. She feels very anxious about her diagnosis.  She has no significant past medical history. Her father has prostate cancer diagnosed at age 8. Paternal aunt had "abdominal cancer." a paternal uncle also had prostate cancer diagnosed age 75. Negative family history of breast or GYN cancer. She lives with her family in a home with her parents, brother, and his brother's wife. She does not work or drive. She lived in [place] and moved to [place] 9 months ago. She has permanent resident card, no health insurance. Has been getting assistance with BCCCP program through [employer].  GYN HISTORY  Menarchal: 7 LMP: currently menstruating  Contraceptive: none HRT: none GP: G0    CURRENT THERAPY:  Pending surgery   INTERVAL HISTORY:   Leitha Hyppolite is here for a follow up.  She completed neoadjuvant chemotherapy 2 weeks ago.  Last week the chemotherapy was canceled due to her worsening neuropathy.  She states her neuropathy has been getting worse since her last dose chemotherapy.  She has tingling and pain on her fingers, feet, arms and legs.  She went to emergency room 2 days ago for this, and Tylenol  3 was prescribed.  She responded well, her pain is much better now.  She has not started Cymbalta yet.  She otherwise feels well overall, anxious to get surgery done.   MEDICAL HISTORY:  Past Medical History:  Diagnosis Date  . Breast cancer ([place]) left  . Family history of cancer   . Genetic testing 04/29/2017   Common Cancers panel (47 genes) @ [facility] - No pathoagenic mutations detected    SURGICAL HISTORY: Past Surgical History:  Procedure Laterality Date  . IR FLUORO GUIDE PORT INSERTION RIGHT  05/03/2017  . IR US GUIDE VASC ACCESS RIGHT  05/03/2017    SOCIAL HISTORY: Social History   Socioeconomic History  . Marital status: Single    Spouse name: Not on file  . Number of children: Not on file  . Years  of education: Not on file  . Highest education level: Not on file  Occupational History  . 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 Financial resource strain: Not on file  . Food insecurity:    Worry: Not on file    Inability: Not on file  . Transportation needs:    Medical: Not on file    Non-medical: Not on file  Tobacco Use  . Smoking status: Never Smoker  . Smokeless tobacco: Never Used  Substance and Sexual Activity  . Alcohol use: No    Frequency: Never  . Drug use: No  . Sexual activity: Never    Birth control/protection: None  Lifestyle  . Physical activity:    Days per week: 3 days    Minutes per session: 30 min  . Stress: Not at all  Relationships  . Social connections:    Talks on phone: More than three times a week    Gets together: More than three times a week    Attends religious service: 1 to 4 times per year    Active member of club or organization: No    Attends meetings of clubs or organizations: Never    Relationship status: Never married  . Intimate partner violence:    Fear of current or ex partner: No    Emotionally abused: No    Physically abused: No    Forced sexual activity: No  Other Topics Concern  . Not on file  Social History Narrative  . Not on file    FAMILY HISTORY: Family History  Problem Relation Age of Onset  . Hyperlipidemia Mother   . Hypertension Mother   . Prostate cancer Father 86       currently 88  . Stomach cancer Paternal Aunt 66       deceased 6s  . Prostate cancer Paternal Uncle 5       deceased 75    ALLERGIES:  is allergic to heparin.  MEDICATIONS:  Current Outpatient Medications  Medication Sig Dispense Refill  . acetaminophen-codeine (TYLENOL #3) 300-30 MG tablet Take 1-2 tablets by mouth every 8 (eight) hours as needed for moderate pain. 20 tablet 0  . ALPRAZolam (XANAX) 0.25 MG tablet Take 1 tablet (0.25 mg total) by mouth at bedtime as needed for anxiety. 30 tablet 0  . ibuprofen (ADVIL,MOTRIN)  200 MG tablet Take 2 tablets (400 mg total) by mouth every 6 (six) hours as needed. 30 tablet 0  . lidocaine-prilocaine (EMLA) cream Apply to affected area once 30 g 3  . LORazepam (ATIVAN) 0.5 MG tablet Take 1 tablet (0.5 mg total) by mouth every 8 (eight) hours as needed (  nausea, vomiting). 20 tablet 0  . ondansetron (ZOFRAN) 8 MG tablet Take 1 tablet (8 mg total) by mouth 2 (two) times daily as needed. Start on the third day after chemotherapy. 30 tablet 1  . prochlorperazine (COMPAZINE) 10 MG tablet Take 1 tablet (10 mg total) by mouth every 6 (six) hours as needed (Nausea or vomiting). 30 tablet 1  . acetaminophen-codeine (TYLENOL #3) 300-30 MG tablet Take 1 tablet by mouth every 6 (six) hours as needed for moderate pain. 30 tablet 0  . DULoxetine (CYMBALTA) 30 MG capsule Take 1 capsule (30 mg total) by mouth daily. 30 capsule 0  . Melatonin 10 MG TABS Take 10 mg by mouth at bedtime as needed and may repeat dose one time if needed. 30 tablet 1   No current facility-administered medications for this visit.     REVIEW OF SYSTEMS:   Constitutional: Denies fevers, chills or abnormal night sweats   (+) dizziness   Eyes: Denies blurriness of vision, double vision or watery eyes Ears, nose, mouth, throat, and face: Denies mucositis or sore throat  (+) tongue discoloration with sensitivity.  Respiratory: Denies cough, dyspnea or wheezes Cardiovascular: Denies palpitation, chest discomfort or lower extremity swelling   Gastrointestinal:  Denies heartburn or change in bowel habits   Skin: Denies abnormal skin rashes Lymphatics: Denies new lymphadenopathy or easy bruising Neurological: (+) neuropathy tingling in her fingers, toes and all extremities Behavioral/Psych: Mood is stable, no new changes  All other systems were reviewed with the patient and are negative.  PHYSICAL EXAMINATION:  ECOG PERFORMANCE STATUS: 1 - Symptomatic but completely ambulatory  Vitals:   09/29/17 0931  BP: 100/77    Pulse: 91  Resp: 18  Temp: 98 F (36.7 C)  SpO2: 100%   Filed Weights   09/29/17 0931  Weight: 217 lb 4.8 oz (98.6 kg)    GENERAL:alert, no distress and comfortable SKIN: skin color, texture, turgor are normal, no rashes or significant lesions EYES: normal, conjunctiva are pink and non-injected, sclera clear OROPHARYNX:no exudate, no erythema and lips, buccal mucosa, and tongue normal  NECK: supple, thyroid normal size, non-tender, without nodularity LYMPH:  no palpable lymphadenopathy in the cervical, axillary or inguinal LUNGS: clear to auscultation and percussion with normal breathing effort HEART: regular rate & rhythm and no murmurs and no lower extremity edema ABDOMEN:abdomen soft, non-tender and normal bowel sounds Musculoskeletal:no cyanosis of digits and no clubbing  PSYCH: alert & oriented x 3 with fluent speech NEURO: no focal motor/sensory deficits BREAST: exam deferred today   LABORATORY DATA:  I have reviewed the data as listed CBC Latest Ref Rng & Units 09/29/2017 09/16/2017 09/09/2017  WBC 3.9 - 10.3 K/uL 2.8(L) 2.2(L) 2.3(L)  Hemoglobin 11.6 - 15.9 g/dL 10.2(L) 10.3(L) 11.0(L)  Hematocrit 34.8 - 46.6 % 29.4(L) 29.9(L) 32.3(L)  Platelets 145 - 400 K/uL 270 216 205    CMP Latest Ref Rng & Units 09/29/2017 09/16/2017 09/09/2017  Glucose 70 - 140 mg/dL 95 149(H) 106  BUN 7 - 26 mg/dL 9 9 9   Creatinine 0.60 - 1.10 mg/dL 0.67 0.72 0.68  Sodium 136 - 145 mmol/L 140 138 138  Potassium 3.5 - 5.1 mmol/L 3.9 4.0 4.0  Chloride 98 - 109 mmol/L 108 105 107  CO2 22 - 29 mmol/L 26 25 25   Calcium 8.4 - 10.4 mg/dL 9.3 9.0 9.3  Total Protein 6.4 - 8.3 g/dL 6.4 6.1(L) 6.5  Total Bilirubin 0.2 - 1.2 mg/dL 0.4 0.5 0.7  Alkaline Phos 40 -  150 U/L 65 63 74  AST 5 - 34 U/L 28 30 31   ALT 0 - 55 U/L 30 41 48   ANC 1.1K today  PATHOLOGY  Diagnosis 05/10/17 1. Cervix, biopsy, 11 o'clock - ATYPICAL SQUAMOUS METAPLASIA ASSOCIATED WITH INFLAMMATION. - SEE COMMENT. 2. Endocervix,  curettage - DETACHED FRAGMENTS OF SQUAMOUS MUCOSA WITH SLIGHT ATYPIA. - BENIGN ENDOCERVICAL MUCOSA. - SEE COMMENT. Microscopic Comment 1. The sections show multiple fragments of mostly metaplastic squamous mucosa displaying prominent chronic and acute inflammation to a lesser extent. This is associated with epithelial atypia in the form of nuclear enlargement, and small nucleoli. The inflammatory process obscures the epithelium which is somewhat difficult to evaluate. A p16 stain was performed and is negative. Overall, definitive or diagnostic squamous intraepithelial lesion is not appreciated in this setting, and the changes may represent a florid reactive state. Nonetheless, clinical correlation and follow up is strongly recommended. 2. The sections show multiple detached fragments of squamous mucosa, some of which display slight atypia. This is admixed with multiple fragments of benign endocervical mucosa. A p16 stain was performed and is negative. The overall changes are not specific or diagnostic of a squamous intraepithelial lesion. Clinical correlation and follow up is recommended. (BNS:ah 05/12/17)  GYNECOLOGIC CYTOLOGY REPORT Adequacy Reason 05/10/17 Satisfactory for evaluation, endocervical/transformation zone component PRESENT. Diagnosis NEGATIVE FOR INTRAEPITHELIAL LESIONS OR MALIGNANCY. BENIGN REACTIVE/REPARATIVE CHANGES.    Diagnosis 04/19/17 1. Breast, left, needle core biopsy, 4:00 o'clock, ribbon clip - INVASIVE DUCTAL CARCINOMA. - SEE COMMENT. 2. Breast, left, needle core biopsy, 2:00 o'clock, coil clip - INVASIVE DUCTAL CARCINOMA. - SEE COMMENT. 3. Lymph node, needle/core biopsy, left axilla, spiral hydromark - DUCTAL CARCINOMA. - SEE COMMENT. Microscopic Comment 1. The carcinoma in the three specimens is morphologically similar and is grade III. Lymph nodal tissue is not definitively identified in specimen #3. A breast prognostic profile will be performed on part  2 and the results reported separately. 2. FLUORESCENCE IN-SITU HYBRIDIZATION Results: HER2 - NEGATIVE RATIO OF HER2/CEP17 SIGNALS 1.53 AVERAGE HER2 COPY NUMBER PER CELL 2.75 2. PROGNOSTIC INDICATORS Results: IMMUNOHISTOCHEMICAL AND MORPHOMETRIC ANALYSIS PERFORMED MANUALLY Estrogen Receptor: 0%, NEGATIVE Progesterone Receptor: 0%, NEGATIVE Proliferation Marker Ki67: 80% COMMENT: The negative hormone receptor study(ies) in this case has An internal positive control.    RADIOGRAPHIC STUDIES: I have personally reviewed the radiological images as listed and agreed with the findings in the report. Mr Breast Bilateral [place] Cad  Result Date: 09/23/2017 CLINICAL DATA:  Biopsy-proven multicentric left breast cancer with metastatic left axillary adenopathy diagnosed December 2018 post neoadjuvant chemotherapy. Assess chemotherapy response. LABS:  None. EXAM: BILATERAL BREAST MRI WITH AND WITHOUT CONTRAST TECHNIQUE: Multiplanar, multisequence MR images of both breasts were obtained prior to and following the intravenous administration of 20 ml of MultiHance. THREE-DIMENSIONAL MR IMAGE RENDERING ON INDEPENDENT WORKSTATION: Three-dimensional MR images were rendered by post-processing of the original MR data on an independent workstation. The three-dimensional MR images were interpreted, and findings are reported in the following complete MRI report for this study. Three dimensional images were evaluated at the independent DynaCad workstation COMPARISON:  Previous exam(s). FINDINGS: Breast composition: b. Scattered fibroglandular tissue. Background parenchymal enhancement: Mild. Right breast: No mass or abnormal enhancement. Left breast: Near complete resolution of the biopsy-proven malignancy at the 4 o'clock position posterior depth with clip artifact present and only subtle residual non mass enhancement at this site. This subtle non mass enhancement measures 2.0 x 0.9 x 1.5 cm in AP,  transverse and craniocaudal  dimensions. Complete resolution of the biopsy-proven second site of malignancy at the 2 o'clock position with clip artifact present. Complete resolution of the previously seen diffuse left breast edema and skin thickening. Lymph nodes: Previously biopsy-proven metastatic left axillary lymph node nearly completely resolved with clip artifact present. No suspicious lymph nodes in either axilla. No abnormal internal mammary chain lymph nodes. Ancillary findings:  None. IMPRESSION: 1. Significant positive imaging response to chemotherapy with resolution of the previously seen biopsy-proven malignancy over the 4 o'clock position of the left breast with only subtle residual non mass enhancement in this location measuring 2.0 x 0.9 x 1.5 cm. 2. Complete imaging response with resolution of the biopsy-proven malignancy over the 2 o'clock position of the left breast. 3. Complete resolution of the previously seen diffuse left breast edema and skin thickening. 4. Near complete resolution of the previously biopsy-proven metastatic left axillary lymph node. No suspicious adenopathy. RECOMMENDATION: Recommend continued follow-up as per clinical treatment plan. BI-RADS CATEGORY  6: Known biopsy-proven malignancy. Electronically Signed   By: Marin Olp M.D.   On: 09/23/2017 15:34    ASSESSMENT & PLAN:  Myeshia Haidaryis a 22 y.o. 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  -She presented with a palpable left breast mass, measures 5.2 cm on ultrasound, with positive  lymph node. Breast tumor biopsy showed triple negative breast ductal carcinoma. -Her staging CT and bone scan was negative for distant metastasis, except a small lesion in liver likely a cyst  -She was seen by Dr. Barry Dienes on 1/7 who feels she is potentially a candidate for breast conserving surgery but more likely will need at least left mastectomy if not bilateral mastectomies due to her young age, tumor size, and risk of recurrence.   -Genetic testing was negative for pathogenic mutation. 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 She received monthly Zoladex injections for the duration of chemotherapy to suppress ovarian function -She completed 4 cycles neoadjuvant AC with neulasta from 05/12/17 - 06/24/17; she tolerated well overall.  -She is started neoadjuvant weekly taxol on 07/08/17 and developed severe generalized body aches and cramps, not responding to NSAIDs.  -I recommended changing to weekly Abraxane. 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  -Her interim mammogram and [place] on 4/19 showed excellent near complete response  -She has seen plastic surgeon about breast reconstruction. She is very reluctant about mastectomy and reconstruction, I have discussed with Dr. Barry Dienes about possibly lumpectomy instead of mastectomy. She has reviewed the intrin scan which showed excellent partial response, but it would be still very challenged to do lumpectomy. Will review her restaging breast MRI and discuss this again after she completes neoadjuvant chemotherapy. -I discussed with the patient and her brother that following chemotherapy, she will proceed to surgery and then radiation therapy.  We discussed the role of adjuvant Xeloda if she has significant residual disease, and the adjuvant immunotherapy Keytruda clinical trial if she qualifies. -Due to her worsening  peripheral neuropathy, and the pain in her extremities, last week chemotherapy was  held. -MR Breast on 09/23/2017 showed: Significant positive imaging response to chemotherapy with resolution of the previously seen biopsy-proven malignancy over the 4 o'clock position of the left breast with only subtle residual non mass enhancement in this location measuring 2.0 x 0.9 x 1.5 cm. Complete imaging response with resolution of the biopsy-proven malignancy over the 2 o'clock position of the left breast. Complete resolution of the previously seen diffuse left breast edema and skin thickening. Near complete resolution of the previously biopsy-proven metastatic left axillary lymph node. No suspicious adenopathy. -Her case was discussed in our breast tumor conference this morning, we felt mastectomy and ALND is probably the best surgical option.  However given patient's strong opinion about not having mastectomy, Dr. Barry Dienes will offer double lumpectomy. -I will refer her to see Dr. Isidore Moos in radiation oncology, to discuss adjuvant radiation. -See her back after surgery. -If she is not qualified for the [facility] clinical trial, okay to remove her port during breast surgery.  9. Peripheral neuropathy, G2, secondary to chemotherapy  -She developed mild intermittent numbness to fingertips after cycle 3 AC. Function is not limited.  No sensory deficits.   -Her neuropathy has progressed on Taxol/Abraxane, she also developed pain on her lower extremities.  Last week chemotherapy was held due to her neuropathy.  She had a ED visit due to the worsening pain. -She is now on Tylenol 3, I will refill for her today for additional 2 to 3 weeks.  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 -I strongly encouraged her to try Cymbalta, which was according last month, but she has not filled.  I sent a new prescription of 30 mg daily to [facility] today.  She agrees to start.  3.   Headaches, dizziness   -When she was diagnosed with breast cancer, she has 1 month history of daily headaches with associated dizziness, pain ranges from 5/10 - 9/10 on the crown of her head, ibuprofen helps some; she can continue this or take tylenol PRN, neuro exam was unremarkable -Given the aggressive nature of triple negative disease, I obtained brain MRI on 05/05/17 which showed no evidence of brain mets.  -Will monitor while on chemo -She reports intermittent dizziness and had a heaviness, sweats, possible related to menopause from chemo and Zoladex injection -overall stable   4.  Anxiety  -She has considerable anxiety about her new diagnosis and treatment plan  -I previously prescribed low dose xanax for her to take PRN for anxiety especially at night if she has difficulty sleeping -Stable   5. Genetics  -Due to her young age and family history of prostate cancer, she has been referred to genetics -05/11/17 Genetic testing was negative for pathogenic mutation  6. Fertility planning  -We discussed the risk of infertility secondary to chemotherapy, she wishes to preserve fertility  -I have referred her to [facility] fertility specialist, initial appointment was on 1/11  -After discussion between the patient and her mother they decided not to pursue fertility preservation through [facility] -We have started her on Zoladex injection on 04/30/17, which hopefully will help to reduce the risk of chemo-induced infertility.  Will stop since she has completed chemotherapy.   7. Irregular vaginal bleeding -She has 2 month history of irregular vaginal bleeding when she was diagnosed with breast cancer -04/15/18 PAP with atypical squamous cells.  -She underwent colposcopy on 05/10/17 with D&C and results were negative for malignancy but with abnormal pap  smear of cervix.  -Will continue to follow up with GYN.  -She has not had a menstrual period since she started chemo and Zoladex injection   8. Hot  flashes, secondary to Zoladex and chemotherapy  -She developed hot flashes after cycle 2, occurring 3 times per day lasting 1-2 minutes each.  -She is not interested in additional medication to manage this symptom. Will monitor -Tolerable overall  9. Small liver lesion -Her initial staging CT scan showed a 1 cm hypoattenuated lesion in the posterior dome of the liver, likely a complicated liver cyst, hemangioma, although metastatic disease is not ruled out. -We will obtain a abdominal MRI with and without contrast for further evaluation before surgery.  PLAN:   -I refilled her Tylenol 3, hopefully she will not need more refill in the future -I called in Cymbalta 30 mg daily, she will start soon -She is scheduled to see Dr. Barry Dienes this afternoon, to schedule her surgery -We will refer her to radiation oncologist Dr. Isidore Moos, to be seen before surgery. -will see her back 2 weeks after surgery  -If she does not qualify for the adjuvant Keytruda clinical trial, okay to remove her port.   Orders Placed This Encounter  Procedures  . MR Abdomen W Wo Contrast    Standing Status:   Future    Standing Expiration Date:   11/30/2018    Order Specific Question:   If indicated for the ordered procedure, I authorize the administration of contrast media per Radiology protocol    Answer:   Yes    Order Specific Question:   What is the patient's sedation requirement?    Answer:   No Sedation    Order Specific Question:   Does the patient have a pacemaker or implanted devices?    Answer:   No    Order Specific Question:   Radiology Contrast Protocol - do NOT remove file path    Answer:   [email or web address]    Order Specific Question:   Preferred imaging location?    Answer:   [facility] (table limit-350 lbs)  . Ambulatory referral to Radiation Oncology    Referral Priority:   Routine    Referral Type:   Consultation    Referral Reason:   Specialty Services Required      Requested Specialty:   Radiation Oncology    Number of Visits Requested:   1      All questions were answered. The patient knows to call the clinic with any problems, questions or concerns. I spent 25 minutes counseling the patient face to face. The total time spent in the appointment was 30 minutes and more than 50% was on counseling.     [name], MD 09/29/2017

## 2017-09-29 ENCOUNTER — Other Ambulatory Visit: Payer: Medicaid Other

## 2017-09-29 ENCOUNTER — Telehealth: Payer: Self-pay | Admitting: Hematology

## 2017-09-29 ENCOUNTER — Inpatient Hospital Stay: Payer: Medicaid Other

## 2017-09-29 ENCOUNTER — Inpatient Hospital Stay: Payer: Medicaid Other | Attending: Nurse Practitioner

## 2017-09-29 ENCOUNTER — Inpatient Hospital Stay (HOSPITAL_BASED_OUTPATIENT_CLINIC_OR_DEPARTMENT_OTHER): Payer: Medicaid Other | Admitting: Hematology

## 2017-09-29 ENCOUNTER — Encounter: Payer: Self-pay | Admitting: Hematology

## 2017-09-29 ENCOUNTER — Other Ambulatory Visit: Payer: Self-pay | Admitting: General Surgery

## 2017-09-29 ENCOUNTER — Encounter: Payer: Self-pay | Admitting: Radiation Oncology

## 2017-09-29 VITALS — BP 100/77 | HR 91 | Temp 98.0°F | Resp 18 | Ht 64.0 in | Wt 217.3 lb

## 2017-09-29 DIAGNOSIS — C773 Secondary and unspecified malignant neoplasm of axilla and upper limb lymph nodes: Secondary | ICD-10-CM | POA: Insufficient documentation

## 2017-09-29 DIAGNOSIS — C50811 Malignant neoplasm of overlapping sites of right female breast: Secondary | ICD-10-CM | POA: Insufficient documentation

## 2017-09-29 DIAGNOSIS — K769 Liver disease, unspecified: Secondary | ICD-10-CM | POA: Insufficient documentation

## 2017-09-29 DIAGNOSIS — N951 Menopausal and female climacteric states: Secondary | ICD-10-CM | POA: Diagnosis not present

## 2017-09-29 DIAGNOSIS — C50812 Malignant neoplasm of overlapping sites of left female breast: Secondary | ICD-10-CM

## 2017-09-29 DIAGNOSIS — R42 Dizziness and giddiness: Secondary | ICD-10-CM | POA: Insufficient documentation

## 2017-09-29 DIAGNOSIS — G620 Drug-induced polyneuropathy: Secondary | ICD-10-CM

## 2017-09-29 DIAGNOSIS — T451X5A Adverse effect of antineoplastic and immunosuppressive drugs, initial encounter: Secondary | ICD-10-CM

## 2017-09-29 DIAGNOSIS — F419 Anxiety disorder, unspecified: Secondary | ICD-10-CM | POA: Insufficient documentation

## 2017-09-29 DIAGNOSIS — D6481 Anemia due to antineoplastic chemotherapy: Secondary | ICD-10-CM

## 2017-09-29 DIAGNOSIS — Z95828 Presence of other vascular implants and grafts: Secondary | ICD-10-CM

## 2017-09-29 DIAGNOSIS — Z171 Estrogen receptor negative status [ER-]: Principal | ICD-10-CM

## 2017-09-29 LAB — CBC WITH DIFFERENTIAL (CANCER CENTER ONLY)
BASOS ABS: 0 10*3/uL (ref 0.0–0.1)
Basophils Relative: 0 %
Eosinophils Absolute: 0 10*3/uL (ref 0.0–0.5)
Eosinophils Relative: 1 %
HEMATOCRIT: 29.4 % — AB (ref 34.8–46.6)
Hemoglobin: 10.2 g/dL — ABNORMAL LOW (ref 11.6–15.9)
LYMPHS PCT: 31 %
Lymphs Abs: 0.9 10*3/uL (ref 0.9–3.3)
MCH: 33.3 pg (ref 25.1–34.0)
MCHC: 34.6 g/dL (ref 31.5–36.0)
MCV: 96.2 fL (ref 79.5–101.0)
Monocytes Absolute: 0.5 10*3/uL (ref 0.1–0.9)
Monocytes Relative: 19 %
Neutro Abs: 1.4 10*3/uL — ABNORMAL LOW (ref 1.5–6.5)
Neutrophils Relative %: 49 %
Platelet Count: 270 10*3/uL (ref 145–400)
RBC: 3.05 MIL/uL — AB (ref 3.70–5.45)
RDW: 18 % — ABNORMAL HIGH (ref 11.2–14.5)
WBC: 2.8 10*3/uL — AB (ref 3.9–10.3)

## 2017-09-29 LAB — CMP (CANCER CENTER ONLY)
ALBUMIN: 3.6 g/dL (ref 3.5–5.0)
ALT: 30 U/L (ref 0–55)
ANION GAP: 6 (ref 3–11)
AST: 28 U/L (ref 5–34)
Alkaline Phosphatase: 65 U/L (ref 40–150)
BILIRUBIN TOTAL: 0.4 mg/dL (ref 0.2–1.2)
BUN: 9 mg/dL (ref 7–26)
CHLORIDE: 108 mmol/L (ref 98–109)
CO2: 26 mmol/L (ref 22–29)
Calcium: 9.3 mg/dL (ref 8.4–10.4)
Creatinine: 0.67 mg/dL (ref 0.60–1.10)
GFR, Est AFR Am: >60 mL/min (ref >60)
GLUCOSE: 95 mg/dL (ref 70–140)
Potassium: 3.9 mmol/L (ref 3.5–5.1)
Sodium: 140 mmol/L (ref 136–145)
TOTAL PROTEIN: 6.4 g/dL (ref 6.4–8.3)

## 2017-09-29 MED ORDER — MELATONIN 10 MG PO TABS: 10.0000 mg | ORAL_TABLET | Freq: Every evening | ORAL | 1 refills | Status: DC | PRN

## 2017-09-29 MED ORDER — ACETAMINOPHEN-CODEINE #3 300-30 MG PO TABS: 1.0000 | ORAL_TABLET | Freq: Three times a day (TID) | ORAL | 0 refills | Status: DC | PRN

## 2017-09-29 MED ORDER — SODIUM CHLORIDE 0.9% FLUSH
10.0000 mL | Freq: Once | INTRAVENOUS | Status: AC
  Administered 2017-09-29: 10 mL
  Filled 2017-09-29: qty 10

## 2017-09-29 MED ORDER — DULOXETINE HCL 30 MG PO CPEP: 30.0000 mg | ORAL_CAPSULE | Freq: Every day | ORAL | 0 refills | Status: AC

## 2017-09-29 MED FILL — DULoxetine HCL 30 MG CPEP: 30 | 30 days supply | Qty: 30 | Fill #0

## 2017-09-29 MED FILL — ACETAMINOPHEN/COD #3 TABLET: 300-30 | 4 days supply | Qty: 20 | Fill #0

## 2017-09-29 NOTE — Progress Notes (Signed)
Open in error

## 2017-09-29 NOTE — Telephone Encounter (Signed)
Rad/onc referral ASAP. Appointment has already been scheduled per 6/12 los

## 2017-09-29 NOTE — Addendum Note (Signed)
Addended by: [name] on: 09/29/2017 10:49 PM   Modules accepted: Orders

## 2017-09-29 NOTE — Progress Notes (Signed)
Error, duplicate

## 2017-09-30 ENCOUNTER — Telehealth: Payer: Self-pay | Admitting: Hematology

## 2017-09-30 LAB — CANCER ANTIGEN 27.29: CAN 27.29: 19.4 U/mL (ref 0.0–38.6)

## 2017-09-30 NOTE — Progress Notes (Signed)
Location of Breast Cancer: Left Breast  Histology per Pathology Report:  04/19/17 Diagnosis 1. Breast, left, needle core biopsy, 4:00 o'clock, ribbon clip - INVASIVE DUCTAL CARCINOMA. - SEE COMMENT. 2. Breast, left, needle core biopsy, 2:00 o'clock, coil clip - INVASIVE DUCTAL CARCINOMA. - SEE COMMENT. 3. Lymph node, needle/core biopsy, left axilla, spiral hydromark - DUCTAL CARCINOMA.   Receptor Status: ER(NEG), PR (NEG), Her2-neu (NEG), Ki-(80%)  Did patient present with symptoms or was this found on screening mammography?: She self palpated a left breast mass late November 2018 and presented to the Emergency Room.   Past/Anticipated interventions by surgeon, if any: Surgery scheduled for 10/14/17. Dr. Barry Dienes.   Past/Anticipated interventions by medical oncology, if any:  09/29/17 Dr. Burr Medico:  05/12/2017 - 09/16/2017 Chemotherapy     1. Neoadjuvant AC q2 weeks x4 cycles then weekly 05/12/17 - 06/24/17 followed by carbo/taxol for 12 weeks starting 07/08/17. Due to poor toleration, changed Taxol to abraxane and added Carboplatin with cycle 2.  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  2. Monthly zoladex injections starting 04/30/17 for the duration of chemotherapy    PLAN:   -I refilled her Tylenol 3, hopefully she will not need more refill in the future -I called in Cymbalta 30 mg daily, she will start soon -She is scheduled to see Dr. Barry Dienes this afternoon, to schedule her surgery -We will refer her to radiation oncologist Dr. Isidore Moos, to be seen before surgery. -will see her back 2 weeks after surgery  -If she does not qualify for the adjuvant Keytruda clinical trial, okay to remove her port.   Lymphedema issues, if any:  N/A  Pain issues, if any:  She denies pain. She reports itching to her fingernails.   SAFETY ISSUES:  Prior radiation? No  Pacemaker/ICD? No  Possible current pregnancy? No, she is not having periods.   Is the patient on methotrexate?  No  Current Complaints / other details:    BP (!) 113/91   Pulse 82   Temp 98 F (36.7 C)   Resp 20   Ht 5' 4"  (1.626 m)   Wt 216 lb 6.4 oz (98.2 kg)   SpO2 100% Comment: room air  BMI 37.14 kg/m    Wt Readings from Last 3 Encounters:  10/01/17 216 lb 6.4 oz (98.2 kg)  09/29/17 217 lb 4.8 oz (98.6 kg)  09/26/17 215 lb 8 oz (97.8 kg)      See Beharry, Stephani Police, RN 09/30/2017,8:24 AM

## 2017-09-30 NOTE — Telephone Encounter (Signed)
Appointment scheduled letter/calendar mailed to patient per 6/12 sch msg

## 2017-10-01 ENCOUNTER — Encounter: Payer: Self-pay | Admitting: Radiation Oncology

## 2017-10-01 ENCOUNTER — Telehealth: Payer: Self-pay

## 2017-10-01 ENCOUNTER — Other Ambulatory Visit: Payer: Self-pay

## 2017-10-01 ENCOUNTER — Ambulatory Visit
Admission: RE | Admit: 2017-10-01 | Discharge: 2017-10-01 | Disposition: A | Discharge status: Home / Self Care | Payer: Medicaid Other | Source: Ambulatory Visit | Attending: Radiation Oncology | Admitting: Radiation Oncology

## 2017-10-01 VITALS — BP 113/91 | HR 82 | Temp 98.0°F | Resp 20 | Ht 64.0 in | Wt 216.4 lb

## 2017-10-01 DIAGNOSIS — Z171 Estrogen receptor negative status [ER-]: Secondary | ICD-10-CM | POA: Insufficient documentation

## 2017-10-01 DIAGNOSIS — C50812 Malignant neoplasm of overlapping sites of left female breast: Secondary | ICD-10-CM | POA: Insufficient documentation

## 2017-10-01 DIAGNOSIS — Z79899 Other long term (current) drug therapy: Secondary | ICD-10-CM | POA: Insufficient documentation

## 2017-10-01 DIAGNOSIS — Z800 Family history of malignant neoplasm of digestive organs: Secondary | ICD-10-CM | POA: Diagnosis not present

## 2017-10-01 DIAGNOSIS — Z888 Allergy status to other drugs, medicaments and biological substances status: Secondary | ICD-10-CM | POA: Diagnosis not present

## 2017-10-01 DIAGNOSIS — Z8042 Family history of malignant neoplasm of prostate: Secondary | ICD-10-CM | POA: Diagnosis not present

## 2017-10-01 NOTE — Progress Notes (Signed)
Radiation Oncology         ([phone]) [phone] ________________________________  Initial outpatient Consultation  Name: Carrie Miller MRN: [id number]  Date: 10/01/2017  DOB: 01-06-1996  TG:[id number], No Pcp Per  [name], MD   REFERRING PHYSICIAN: [name], MD  DIAGNOSIS:    ICD-10-CM   1. 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 Clinical staging from 04/19/18: Stage IIIC (cT3(m), cN1, cM0), TD:SKAJGOTL, PR: negative, Her2: negative. Grade 3   (Cancer stage was possibly T4d at diagnosis)  CHIEF COMPLAINT: Here to discuss management of left breast cancer  HISTORY OF PRESENT ILLNESS::Carrie Miller is a 22 y.o. female who presented with a self palpated left breast mass in late November 2018 and then presented to the emergency room. Mammogram on 04/15/17 showed a highly suspicious irregular mass within the left breast at the 4 o'clock axis, 8 cm from the nipple, measuring 5.2 cm, corresponding the the area of clinical concern. There was also an additional suspicious mass within the left nipple at the 2 o'clock axis, 8 cm from the nipple and 10 cm from the nipple in the axillary tail region. Biopsy on 04/19/17 showed the carcinoma in the three specimens is morphologically similar and is grade 3. Lymph nodal tissue is not definitively identified in specimen #3 with characteristics as described above in the diagnosis. There was no evidence of malignancy in the right breast. Patient then underwent a Breast MRI on 04/30/2017 which revealed invasive carcinoma within the lower outer quadrant of the left breast, 4 o'clock axis, at the posterior depth measuring 4.8cm. Invasive carcinoma within the upper outer quadrant of the left breast at the 2 o'clock axis, diffuse edema throughout the left breast with thickening throughout the left breast, indicating highly suspicious inflammatory breast cancer. And metastatic lymph node within  the left axilla measures 4.1cm and two additional borderline prominent lymph nodes with the left axilla.   Patients echocardiogram on 04/30/2017 demonstrated everything to be normal. On 05/05/2017 MRI of the brain demonstrated no intracranial or calvarial metastatic diease. CT CAP on 05/07/17 revealed two left breast masses and a grossly abnormal 3.9 cm lower left axillary lymph node and a 1cm hypoattenuated lesion in the posterior dome of the liver with internal attenuation. Bone scan showed no evidence of osseous metastases. Patient underwent neoadjuvant chemotherapy 05/12/17 to 09/16/17,Neoadjuvant AC q2 weeks x4 cycles then weekly 05/12/17 - 06/24/17 followed by carbo/taxol for 12 weeks starting 07/08/17. Due to poor toleration she was changed from taxol to abraxane and carboplatin was added with cycle 2, her last week of chemotherapy was cancelled due to neuropathy. Monthly zoladex injection starting 04/30/17 for the duration of chemotherapy. Mammogram on 08/06/17 showed no evidence of malignancy at the location of recent palpable concern in the upper outer left quadrant and there was a decrease in size and conspicuity of the biopsy proven invasive ductal carcinoma in the 2 o'clock and 4 o'clock positions of the left breast with no discrete residual masses. Complete imagining resolution of the previously biopsied metastatic left inferior axillary lymph node.  She was discussed at breast tumor board and she is leaning heavily towards breast conservation even though mastectomy+axillary lymph node dissection were strongly recommended. She did undergo genetic testing, but no mutations were revealed. There were variants of uncertain significance noted.  PREVIOUS RADIATION THERAPY: No  PAST MEDICAL HISTORY:  has a past medical history of Breast cancer ([place]) (left), Family  history of cancer, and Genetic testing (04/29/2017).    PAST SURGICAL HISTORY: Past Surgical History:  Procedure Laterality Date  . IR FLUORO  GUIDE PORT INSERTION RIGHT  05/03/2017  . IR US GUIDE VASC ACCESS RIGHT  05/03/2017    FAMILY HISTORY: family history includes Hyperlipidemia in her mother; Hypertension in her mother; Prostate cancer (age of onset: 34) in her paternal uncle; Prostate cancer (age of onset: 17) in her father; Stomach cancer (age of onset: 52) in her paternal aunt.  SOCIAL HISTORY:  reports that she has never smoked. She has never used smokeless tobacco. She reports that she does not drink alcohol or use drugs.  ALLERGIES: Heparin  MEDICATIONS:  Current Outpatient Medications  Medication Sig Dispense Refill  . acetaminophen-codeine (TYLENOL #3) 300-30 MG tablet Take 1-2 tablets by mouth every 8 (eight) hours as needed for moderate pain. 20 tablet 0  . ALPRAZolam (XANAX) 0.25 MG tablet Take 1 tablet (0.25 mg total) by mouth at bedtime as needed for anxiety. 30 tablet 0  . lidocaine-prilocaine (EMLA) cream Apply to affected area once 30 g 3  . Melatonin 10 MG TABS Take 10 mg by mouth at bedtime as needed and may repeat dose one time if needed. 30 tablet 1  . DULoxetine (CYMBALTA) 30 MG capsule Take 1 capsule (30 mg total) by mouth daily. (Patient not taking: Reported on 10/01/2017) 30 capsule 0  . ibuprofen (ADVIL,MOTRIN) 200 MG tablet Take 2 tablets (400 mg total) by mouth every 6 (six) hours as needed. (Patient not taking: Reported on 10/01/2017) 30 tablet 0  . LORazepam (ATIVAN) 0.5 MG tablet Take 1 tablet (0.5 mg total) by mouth every 8 (eight) hours as needed (nausea, vomiting). (Patient not taking: Reported on 10/01/2017) 20 tablet 0  . ondansetron (ZOFRAN) 8 MG tablet Take 1 tablet (8 mg total) by mouth 2 (two) times daily as needed. Start on the third day after chemotherapy. (Patient not taking: Reported on 10/01/2017) 30 tablet 1  . prochlorperazine (COMPAZINE) 10 MG tablet Take 1 tablet (10 mg total) by mouth every 6 (six) hours as needed (Nausea or vomiting). (Patient not taking: Reported on 10/01/2017) 30  tablet 1   No current facility-administered medications for this encounter.     REVIEW OF SYSTEMS: as above   PHYSICAL EXAM:  height is _0  (1.626 m) and weight is 216 lb 6.4 oz (98.2 kg). Her temperature is 98 F (36.7 C). Her blood pressure is 113/91 (abnormal) and her pulse is 82. Her respiration is 20 and oxygen saturation is 100%.   General: Alert and oriented, in no acute distress HEENT: Head is normocephalic. Extraocular movements are intact. Oropharynx is clear. Neck: Neck is supple, no palpable cervical or supraclavicular lymphadenopathy. Heart: Regular in rate and rhythm with no murmurs, rubs, or gallops. Chest: Clear to auscultation bilaterally, with no rhonchi, wheezes, or rales. Abdomen: Soft, nontender, nondistended, with no rigidity or guarding. Extremities: No cyanosis or edema. Lymphatics: see Neck Exam Skin: No concerning lesions. Musculoskeletal: symmetric strength and muscle tone throughout. Neurologic: Cranial nerves II through XII are grossly intact. No obvious focalities. Speech is fluent. Coordination is intact. Psychiatric: Judgment and insight are intact. Affect is appropriate. Vascular exam:  right upper chest portacath Breasts: No obvious palpable masses in her breasts or axillary regions, subtle thickening in the upper outer quadrant of the left breast.    ECOG = 0  0 - Asymptomatic (Fully active, able to carry on all predisease activities without restriction)  1 -  Symptomatic but completely ambulatory (Restricted in physically strenuous activity but ambulatory and able to carry out work of a light or sedentary nature. For example, light housework, office work)  2 - Symptomatic, <50% in bed during the day (Ambulatory and capable of all self care but unable to carry out any work activities. Up and about more than 50% of waking hours)  3 - Symptomatic, >50% in bed, but not bedbound (Capable of only limited self-care, confined to bed or chair 50% or more  of waking hours)  4 - Bedbound (Completely disabled. Cannot carry on any self-care. Totally confined to bed or chair)  5 - Death   Eustace Pen MM, Creech RH, Tormey DC, et al. [phone]). "Toxicity and response criteria of the [facility]". [place] Oncol. 5 (6): 649-55   LABORATORY DATA:  Lab Results  Component Value Date   WBC 2.8 (L) 09/29/2017   HGB 10.2 (L) 09/29/2017   HCT 29.4 (L) 09/29/2017   MCV 96.2 09/29/2017   PLT 270 09/29/2017   CMP     Component Value Date/Time   NA 140 09/29/2017 0914   K 3.9 09/29/2017 0914   CL 108 09/29/2017 0914   CO2 26 09/29/2017 0914   GLUCOSE 95 09/29/2017 0914   BUN 9 09/29/2017 0914   CREATININE 0.67 09/29/2017 0914   CALCIUM 9.3 09/29/2017 0914   PROT 6.4 09/29/2017 0914   ALBUMIN 3.6 09/29/2017 0914   AST 28 09/29/2017 0914   ALT 30 09/29/2017 0914   ALKPHOS 65 09/29/2017 0914   BILITOT 0.4 09/29/2017 0914   GFRNONAA >60 09/29/2017 0914   GFRAA >60 09/29/2017 0914         RADIOGRAPHY: Mr Breast Bilateral W Wo Contrast Inc Cad  Result Date: 09/23/2017 CLINICAL DATA:  Biopsy-proven multicentric left breast cancer with metastatic left axillary adenopathy diagnosed December 2018 post neoadjuvant chemotherapy. Assess chemotherapy response. LABS:  None. EXAM: BILATERAL BREAST MRI WITH AND WITHOUT CONTRAST TECHNIQUE: Multiplanar, multisequence MR images of both breasts were obtained prior to and following the intravenous administration of 20 ml of MultiHance. THREE-DIMENSIONAL MR IMAGE RENDERING ON INDEPENDENT WORKSTATION: Three-dimensional MR images were rendered by post-processing of the original MR data on an independent workstation. The three-dimensional MR images were interpreted, and findings are reported in the following complete MRI report for this study. Three dimensional images were evaluated at the independent DynaCad workstation COMPARISON:  Previous exam(s). FINDINGS: Breast composition: b. Scattered  fibroglandular tissue. Background parenchymal enhancement: Mild. Right breast: No mass or abnormal enhancement. Left breast: Near complete resolution of the biopsy-proven malignancy at the 4 o'clock position posterior depth with clip artifact present and only subtle residual non mass enhancement at this site. This subtle non mass enhancement measures 2.0 x 0.9 x 1.5 cm in AP, transverse and craniocaudal dimensions. Complete resolution of the biopsy-proven second site of malignancy at the 2 o'clock position with clip artifact present. Complete resolution of the previously seen diffuse left breast edema and skin thickening. Lymph nodes: Previously biopsy-proven metastatic left axillary lymph node nearly completely resolved with clip artifact present. No suspicious lymph nodes in either axilla. No abnormal internal mammary chain lymph nodes. Ancillary findings:  None. IMPRESSION: 1. Significant positive imaging response to chemotherapy with resolution of the previously seen biopsy-proven malignancy over the 4 o'clock position of the left breast with only subtle residual non mass enhancement in this location measuring 2.0 x 0.9 x 1.5 cm. 2. Complete imaging response with resolution of the biopsy-proven malignancy  over the 2 o'clock position of the left breast. 3. Complete resolution of the previously seen diffuse left breast edema and skin thickening. 4. Near complete resolution of the previously biopsy-proven metastatic left axillary lymph node. No suspicious adenopathy. RECOMMENDATION: Recommend continued follow-up as per clinical treatment plan. BI-RADS CATEGORY  6: Known biopsy-proven malignancy. Electronically Signed   By: Marin Olp M.D.   On: 09/23/2017 15:34       IMPRESSION/PLAN: 22 y.o. women with Left breast cancer, T3N1M0 with encouraging clinical response to neoadjuvant chemotherapy. 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   I had  a lenghty discussion with patient and her brother today, given her history  I recommend she undergo mastectomy and axillary lymph node dissection for best chance of cure and local regional control. However, the patient seems to be leaning towards breast conservation, despite the recommendations of her team and the understanding that she will be at high risk for locoregional relapse. The patient expressed concerns about the cosmesis of losing her breast and she was not terribly interested in reconstructive options. She understands that radiotherapy would be indicated adjuvantly regardless if she undergoes mastectomy or double lumpectomy. She understands that radiotherapy would be given for 6 - 7 weeks and I would focus on the left breast/chest and regional nodes. The radiotherapy would carry a small risk of secondary malignancies but the risk would be outweighed by her cancer relapsing if she does not undergo radiotherapy. Consent was signed today.  It was a pleasure meeting the patient today. We discussed the risks, benefits, and side effects of radiotherapy. I recommend radiotherapy to the breast or chest wall and regional nodes to reduce her risk of locoregional recurrence by 2/3.  We discussed that radiation would take approximately 6-7 weeks to complete and that I would give the patient a few weeks to heal following surgery before starting treatment planning.   We spoke about acute effects including skin irritation and fatigue as well as much less common late effects including internal organ injury or irritation. We spoke about the latest technology that is used to minimize the risk of late effects for patients undergoing radiotherapy to the breast or chest wall. No guarantees of treatment were given. The patient is enthusiastic about proceeding with treatment. I look forward to participating in the patient's care.  I will await her referral back to me for postoperative follow-up and eventual CT  simulation/treatment planning.  I spent 50 minutes  face to face with the patient and more than 50% of that time was spent in counseling and/or coordination of care.   __________________________________________   Eppie Gibson, MD   This document serves as a record of services personally performed by Eppie Gibson MD. It was created on her behalf by Delton Coombes, a trained medical scribe. The creation of this record is based on the scribe's personal observations and the provider's statements to them.

## 2017-10-01 NOTE — Telephone Encounter (Signed)
Called MRI Central Scheduling, appointment for approved MRI is Wednesday 10/06/17 at 8:00 am to arrive at 7:30 am. NPO starting at 4:00 am.

## 2017-10-01 NOTE — Telephone Encounter (Signed)
Called spoke with patient's brother (who speaks English) informed of MRI appointment for Wednesday 10/06/17 at 8:00 am to arrive by 7:30 am, nothing to eat or drink past 4:00 am.  He verbalized an understanding and will let her know.  He will be bringing her for scan.

## 2017-10-06 ENCOUNTER — Ambulatory Visit (HOSPITAL_COMMUNITY)
Admission: RE | Admit: 2017-10-06 | Discharge: 2017-10-06 | Disposition: A | Discharge status: Home / Self Care | Payer: Medicaid Other | Source: Ambulatory Visit | Attending: Hematology | Admitting: Hematology

## 2017-10-06 DIAGNOSIS — D1809 Hemangioma of other sites: Secondary | ICD-10-CM | POA: Insufficient documentation

## 2017-10-06 DIAGNOSIS — K760 Fatty (change of) liver, not elsewhere classified: Secondary | ICD-10-CM | POA: Insufficient documentation

## 2017-10-06 DIAGNOSIS — K769 Liver disease, unspecified: Secondary | ICD-10-CM | POA: Diagnosis present

## 2017-10-06 MED ORDER — GADOBENATE DIMEGLUMINE 529 MG/ML IV SOLN
20.0000 mL | Freq: Once | INTRAVENOUS | Status: AC | PRN
  Administered 2017-10-06: 20 mL via INTRAVENOUS

## 2017-10-07 ENCOUNTER — Telehealth: Payer: Self-pay

## 2017-10-07 NOTE — Telephone Encounter (Signed)
Per Dr. Burr Medico spoke with patient's brother to let them know MRI of the abdomen was negative, no concerns.   He verbalized an understanding.

## 2017-10-07 NOTE — Telephone Encounter (Signed)
-----   Message from [name], MD sent at 10/07/2017  8:41 AM EDT ----- Please call pt or her brother and let her know the MRI result, no concerns, thanks   [name]  10/07/2017

## 2017-10-08 NOTE — Pre-Procedure Instructions (Addendum)
Gordon Carlson  10/08/2017      [place], [place] - [address] [place] [address] Phone: [phone] Fax: [phone]    Your procedure is scheduled on June 27 from 12:02-2:26pm.  Report to [facility] Admitting at 10:00 A.M.  Call this number if you have problems the morning of surgery:  [phone]   Remember:  Do not eat or drink after midnight.   Please complete your PRE-SURGERY ENSURE that was given to before 9am the morning of surgery.  Please, if able, drink it in one setting. DO NOT SIP.   Take these medicines the morning of surgery with A SIP OF WATER  acetaminophen-codeine (TYLENOL #3) 300-30 MG tablet-if needed for pain.    7 days prior to surgery STOP taking any Aspirin(unless otherwise instructed by your surgeon), Aleve, Naproxen, Ibuprofen, Motrin, Advil, Goody's, BC's, all herbal medications, fish oil, and all vitamins.    Do not wear jewelry, make-up or nail polish.  Do not wear lotions, powders, or perfumes, or deodorant.  Do not shave 48 hours prior to surgery.   Do not bring valuables to the hospital.  [facility] is not responsible for any belongings or valuables.  Eyeglasses, contacts, hearing aids, dentures or bridgework may not be worn into surgery.  Leave your suitcase in the car.  After surgery it may be brought to your room.  For patients admitted to the hospital, discharge time will be determined by your treatment team.  Patients discharged the day of surgery will not be allowed to drive home.   [facility]- Preparing For Surgery  Before surgery, you can play an important role. Because skin is not sterile, your skin needs to be as free of germs as possible. You can reduce the number of germs on your skin by washing with CHG (chlorahexidine gluconate) Soap before surgery.  CHG is an antiseptic cleaner which kills germs and bonds with the skin to continue killing  germs even after washing.    Oral Hygiene is also important to reduce your risk of infection.  Remember - BRUSH YOUR TEETH THE MORNING OF SURGERY WITH YOUR REGULAR TOOTHPASTE  Please do not use if you have an allergy to CHG or antibacterial soaps. If your skin becomes reddened/irritated stop using the CHG.  Do not shave (including legs and underarms) for at least 48 hours prior to first CHG shower. It is OK to shave your face.  Please follow these instructions carefully.   1. Shower the NIGHT BEFORE SURGERY and the MORNING OF SURGERY with CHG.   2. If you chose to wash your hair, wash your hair first as usual with your normal shampoo.  3. After you shampoo, rinse your hair and body thoroughly to remove the shampoo.  4. Use CHG as you would any other liquid soap. You can apply CHG directly to the skin and wash gently with a scrungie or a clean washcloth.   5. Apply the CHG Soap to your body ONLY FROM THE NECK DOWN.  Do not use on open wounds or open sores. Avoid contact with your eyes, ears, mouth and genitals (private parts). Wash Face and genitals (private parts)  with your normal soap.  6. Wash thoroughly, paying special attention to the area where your surgery will be performed.  7. Thoroughly rinse your body with warm water from the neck down.  8. DO NOT shower/wash with your normal soap after  using and rinsing off the CHG Soap.  9. Pat yourself dry with a CLEAN TOWEL.  10. Wear CLEAN PAJAMAS to bed the night before surgery, wear comfortable clothes the morning of surgery  11. Place CLEAN SHEETS on your bed the night of your first shower and DO NOT SLEEP WITH PETS.   Day of Surgery:  Do not apply any deodorants/lotions.  Please wear clean clothes to the hospital/surgery center.   Remember to brush your teeth WITH YOUR REGULAR TOOTHPASTE.  Please read over the following fact sheets that you were given. Coughing and Deep Breathing and Surgical Site Infection  Prevention

## 2017-10-11 NOTE — H&P (Signed)
Carrie Miller Documented: 09/29/2017 2:52 PM Location: [facility] Patient #: [id number] DOB: Aug 31, 1995 Single / Language: Arabic / Race: Undefined Female   History of Present Illness Carrie Klein MD; 09/29/2017 3:57 PM) The patient is a 22 year old female who presents for a follow-up for Breast cancer. Patient is referred for consultation by Carrie Miller January 2019 for new dx of left breast cancer. She is an unfortunate 22 year old female who presented with a palpable left breast mass. She underwent diagnostic imaging showing a approximately a 5 cm mass at 4:00 and a 1.8 cm mass at 2:00. She had a 3 cm lymph node in the axilla. 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 She is having some soreness at the site of the biopsy and complains of dizziness. She has a family history of prostate cancer in her father and her paternal uncle. She has a paternal aunt who had some sort of intra-abdominal malignancy as well. She thinks her father's prostate cancer occurred when he was in his 69s.   She had negative genetic testing and has been undergoing neoadjuvant chemotherapy with Carrie Miller. She had post tx MRI which showed dramatic improvement. She is very resistant to mastectomy and wants to discuss lumpectomy.   MRI 09/23/17 IMPRESSION: 1. Significant positive imaging response to chemotherapy with resolution of the previously seen biopsy-proven malignancy over the 4 o'clock position of the left breast with only subtle residual non mass enhancement in this location measuring 2.0 x 0.9 x 1.5 cm.  2. Complete imaging response with resolution of the biopsy-proven malignancy over the 2 o'clock position of the left breast.  3. Complete resolution of the previously seen diffuse left breast edema and skin thickening.  4. Near complete resolution of the previously  biopsy-proven metastatic left axillary lymph node. No suspicious adenopathy.  RECOMMENDATION: Recommend continued follow-up as per clinical treatment plan.  BI-RADS CATEGORY 6: Known biopsy-proven malignancy.  Dx mammogram/us 04/15/2017 IMPRESSION: 1. Highly suspicious irregular mass within the left breast at the 4 o'clock axis, 8 cm from the nipple, measuring 5.2 cm, corresponding to the area of clinical concern. Ultrasound-guided biopsy is recommended. 2. Additional suspicious irregular mass within the left breast at the 2 o'clock axis, 8 cm from the nipple, measuring 1.6 cm, corresponding to the mammographic finding. Ultrasound-guided biopsy is recommended. 3. Additional suspicious mass within the left breast at the 2 o'clock axis, 10 cm from the nipple, axillary tail region, measuring 3 cm, suspected lymph node completely replaced by tumor. Ultrasound-guided biopsy is recommended. 4. No evidence of malignancy within the right breast.  pathology 04/19/17 Diagnosis 1. Breast, left, needle core biopsy, 4:00 o'clock, ribbon clip - INVASIVE DUCTAL CARCINOMA. - SEE COMMENT. 2. Breast, left, needle core biopsy, 2:00 o'clock, coil clip - INVASIVE DUCTAL CARCINOMA. - SEE COMMENT. 3. Lymph node, needle/core biopsy, left axilla, spiral hydromark - DUCTAL CARCINOMA. - SEE COMMENT. Microscopic Comment 1. The carcinoma in the three specimens is morphologically similar and is grade III. Lymph nodal tissue is not definitively identified in specimen #3. Estrogen Receptor: 0%, NEGATIVE Progesterone Receptor: 0%, NEGATIVE Proliferation Marker Ki67: 80% 2. FLUORESCENCE IN-SITU HYBRIDIZATION Results: HER2 - NEGATIVE   Allergies Carrie Miller; 09/29/2017 2:52 PM) No Known Drug Allergies [04/26/2017]: Allergies Reconciled   Medication History Carrie Miller; 09/29/2017 2:53 PM) Acetaminophen-Codeine #3 (300-30MG Tablet, Oral) Active. No Current Medications (Taken starting  09/29/2017)  ALPRAZolam (0.25MG Tablet, Oral) Active. LORazepam (0.5MG Tablet, Oral) Active. TraMADol HCl (50MG Tablet, Oral) Active. DULoxetine HCl (20MG Capsule DR Part, Oral) Active. Prochlorperazine Maleate (10MG Tablet, Oral) Active. Medications Reconciled    Review of Systems Carrie Klein MD; 09/29/2017 3:58 PM) All other systems negative  Vitals Carrie Miller; 09/29/2017 2:54 PM) 09/29/2017 2:53 PM Weight: 217.38 lb Height: 63.5in Body Surface Area: 2.02 m Body Mass Index: 37.9 kg/m  Temp.: 98.49F(Oral)  Pulse: 102 (Regular)  BP: 122/80 (Sitting, Left Arm, Standard)       Physical Exam Carrie Klein MD; 09/29/2017 3:59 PM) General Mental Status-Alert. General Appearance-Consistent with stated age. Hydration-Well hydrated. Voice-Normal.  Head and Neck Head-normocephalic, atraumatic with no lesions or palpable masses.  Eye Sclera/Conjunctiva - Bilateral-No scleral icterus.  Chest and Lung Exam Chest and lung exam reveals -quiet, even and easy respiratory effort with no use of accessory muscles. Inspection Chest Wall - Normal. Back - normal.  Breast Note: left breast without residual palpable mass. No skin thickening seen. No palpable LAD. no nipple retraction or change in contour of breast.   Cardiovascular Cardiovascular examination reveals -normal pedal pulses bilaterally. Note: regular rate and rhythm  Abdomen Inspection-Inspection Normal. Palpation/Percussion Palpation and Percussion of the abdomen reveal - Soft, Non Tender, No Rebound tenderness, No Rigidity (guarding) and No hepatosplenomegaly.  Peripheral Vascular Upper Extremity Inspection - Bilateral - Normal - No Clubbing, No Cyanosis, No Edema, Pulses Intact. Lower Extremity Palpation - Edema - Bilateral - No edema.  Neurologic Neurologic evaluation reveals -alert and oriented x 3 with no impairment of recent or remote memory. Mental  Status-Normal.  Musculoskeletal Global Assessment -Note: no gross deformities.  Normal Exam - Left-Upper Extremity Strength Normal and Lower Extremity Strength Normal. Normal Exam - Right-Upper Extremity Strength Normal and Lower Extremity Strength Normal.  Lymphatic Head & Neck  General Head & Neck Lymphatics: Bilateral - Description - Normal. Axillary  General Axillary Region: Bilateral - Description - Normal. Tenderness - Non Tender.    Assessment & Plan Carrie Klein MD; 09/29/2017 4:02 PM) CANCER OF OVERLAPPING SITES OF LEFT BREAST (X44.818) Impression: Patient is a 22 year old female with clinical T3N1 left breast cancer that is triple negative, s/p neoadjuvant chemotherapy. We discussed her in multidisciplinary conference this morning. Given the appearance of her initial MRI and her age, mastectomy is recommended formally. I discussed, however, that there is a change of complete pathologic response, and that if desired, we could attempt dual lumpectomies.  This would definitely require adjuvant radiation. I discussed that if margins from lumpectomy were positive, we would need to do a mastectomy. I also discussed that if there is any disease in the axilla, that radiation would be recommended. I discussed that either strategy could leave [place] with possibility of recurrent cancer in the breast, axilla, or elsewhere in the body, and that this could end up being fatal.  The surgical procedure was described to the patient. I discussed the incision type and location and that we would need radiology involved on with a wire or seed marker and/or sentinel node.  The risks and benefits of the procedure were described to the patient and she wishes to proceed.  We discussed the risks bleeding, infection, damage to other structures, need for further procedures/surgeries. We discussed the risk of seroma. The patient was advised if the area in the breast in cancer, we may need to go back to  surgery for additional tissue to obtain negative margins or for a lymph node biopsy. The patient was  advised that these are the most common complications, but that others can occur as well. They were advised against taking aspirin or other anti-inflammatory agents/blood thinners the week before surgery. BREAST CANCER METASTASIZED TO AXILLARY LYMPH NODE (C50.919) Impression: Clinically resolved. Current Plans Pt Education - [facility] breast cancer surgery: discussed with patient and provided information. Schedule for Surgery   Signed by Carrie Klein, MD (09/29/2017 4:03 PM)

## 2017-10-12 ENCOUNTER — Other Ambulatory Visit: Payer: Self-pay

## 2017-10-12 ENCOUNTER — Encounter (HOSPITAL_COMMUNITY): Payer: Self-pay

## 2017-10-12 ENCOUNTER — Encounter (HOSPITAL_COMMUNITY)
Admission: RE | Admit: 2017-10-12 | Discharge: 2017-10-12 | Disposition: A | Discharge status: Home / Self Care | Payer: Medicaid Other | Source: Ambulatory Visit | Attending: General Surgery | Admitting: General Surgery

## 2017-10-12 DIAGNOSIS — Z01812 Encounter for preprocedural laboratory examination: Secondary | ICD-10-CM | POA: Diagnosis present

## 2017-10-12 LAB — CBC
HCT: 34 % — ABNORMAL LOW (ref 36.0–46.0)
Hemoglobin: 11.2 g/dL — ABNORMAL LOW (ref 12.0–15.0)
MCH: 32.4 pg (ref 26.0–34.0)
MCHC: 32.9 g/dL (ref 30.0–36.0)
MCV: 98.3 fL (ref 78.0–100.0)
PLATELETS: 225 10*3/uL (ref 150–400)
RBC: 3.46 MIL/uL — AB (ref 3.87–5.11)
RDW: 15.7 % — ABNORMAL HIGH (ref 11.5–15.5)
WBC: 4.3 10*3/uL (ref 4.0–10.5)

## 2017-10-12 LAB — BASIC METABOLIC PANEL
ANION GAP: 8 (ref 5–15)
BUN: 11 mg/dL (ref 6–20)
CO2: 24 mmol/L (ref 22–32)
CREATININE: 0.6 mg/dL (ref 0.44–1.00)
Calcium: 9.6 mg/dL (ref 8.9–10.3)
Chloride: 107 mmol/L (ref 98–111)
GFR calc non Af Amer: >60 mL/min (ref >60)
Glucose, Bld: 81 mg/dL (ref 70–99)
Potassium: 4.2 mmol/L (ref 3.5–5.1)
SODIUM: 139 mmol/L (ref 135–145)

## 2017-10-12 NOTE — Progress Notes (Addendum)
Oncologist- Dr. [name] Cardiologist - patient denies  Chest x-ray - N/A EKG - N/A Stress Test -patient denies  ECHO - 04/30/2017 Cardiac Cath - patient denies   Sleep Study - pt denies  Blood Thinner Instructions: N/A Aspirin Instructions:N/A  Anesthesia review: No   Pt speaks Dari (language of [place]) and will need interpreter services on DOS.   Patient denies shortness of breath, fever, cough and chest pain at PAT appointment   Patient verbalized understanding of instructions that were given to them at the PAT appointment. Patient was also instructed that they will need to review over the PAT instructions again at home before surgery.

## 2017-10-13 ENCOUNTER — Ambulatory Visit
Admission: RE | Admit: 2017-10-13 | Discharge: 2017-10-13 | Disposition: A | Discharge status: Home / Self Care | Payer: Medicaid Other | Source: Ambulatory Visit | Attending: General Surgery | Admitting: General Surgery

## 2017-10-13 DIAGNOSIS — Z171 Estrogen receptor negative status [ER-]: Principal | ICD-10-CM

## 2017-10-13 DIAGNOSIS — C50812 Malignant neoplasm of overlapping sites of left female breast: Secondary | ICD-10-CM

## 2017-10-14 ENCOUNTER — Ambulatory Visit
Admission: RE | Admit: 2017-10-14 | Discharge: 2017-10-14 | Disposition: A | Discharge status: Home / Self Care | Payer: Medicaid Other | Source: Ambulatory Visit | Attending: General Surgery | Admitting: General Surgery

## 2017-10-14 ENCOUNTER — Ambulatory Visit (HOSPITAL_COMMUNITY): Payer: Medicaid Other | Admitting: Anesthesiology

## 2017-10-14 ENCOUNTER — Encounter (HOSPITAL_COMMUNITY)
Admission: RE | Disposition: A | Discharge status: Home / Self Care | Payer: Self-pay | Source: Ambulatory Visit | Attending: General Surgery

## 2017-10-14 ENCOUNTER — Other Ambulatory Visit: Payer: Self-pay

## 2017-10-14 ENCOUNTER — Ambulatory Visit (HOSPITAL_COMMUNITY)
Admission: RE | Admit: 2017-10-14 | Discharge: 2017-10-15 | Disposition: A | Discharge status: Home / Self Care | Payer: Medicaid Other | Source: Ambulatory Visit | Attending: General Surgery | Admitting: General Surgery

## 2017-10-14 ENCOUNTER — Encounter (HOSPITAL_COMMUNITY): Payer: Self-pay | Admitting: Urology

## 2017-10-14 DIAGNOSIS — Z6836 Body mass index (BMI) 36.0-36.9, adult: Secondary | ICD-10-CM | POA: Diagnosis not present

## 2017-10-14 DIAGNOSIS — Z809 Family history of malignant neoplasm, unspecified: Secondary | ICD-10-CM | POA: Diagnosis not present

## 2017-10-14 DIAGNOSIS — Z171 Estrogen receptor negative status [ER-]: Secondary | ICD-10-CM | POA: Diagnosis not present

## 2017-10-14 DIAGNOSIS — C50912 Malignant neoplasm of unspecified site of left female breast: Secondary | ICD-10-CM | POA: Diagnosis present

## 2017-10-14 DIAGNOSIS — Z8042 Family history of malignant neoplasm of prostate: Secondary | ICD-10-CM | POA: Diagnosis not present

## 2017-10-14 DIAGNOSIS — G629 Polyneuropathy, unspecified: Secondary | ICD-10-CM | POA: Insufficient documentation

## 2017-10-14 DIAGNOSIS — Z79899 Other long term (current) drug therapy: Secondary | ICD-10-CM | POA: Diagnosis not present

## 2017-10-14 DIAGNOSIS — C50512 Malignant neoplasm of lower-outer quadrant of left female breast: Secondary | ICD-10-CM | POA: Diagnosis not present

## 2017-10-14 DIAGNOSIS — C50412 Malignant neoplasm of upper-outer quadrant of left female breast: Secondary | ICD-10-CM | POA: Diagnosis not present

## 2017-10-14 DIAGNOSIS — C50812 Malignant neoplasm of overlapping sites of left female breast: Secondary | ICD-10-CM

## 2017-10-14 DIAGNOSIS — E669 Obesity, unspecified: Secondary | ICD-10-CM | POA: Insufficient documentation

## 2017-10-14 DIAGNOSIS — Z9221 Personal history of antineoplastic chemotherapy: Secondary | ICD-10-CM | POA: Insufficient documentation

## 2017-10-14 HISTORY — PX: PORT-A-CATH REMOVAL: SHX5289

## 2017-10-14 HISTORY — PX: BREAST LUMPECTOMY WITH RADIOACTIVE SEED AND AXILLARY LYMPH NODE DISSECTION: SHX6656

## 2017-10-14 LAB — POCT PREGNANCY, URINE: PREG TEST UR: NEGATIVE

## 2017-10-14 SURGERY — BREAST LUMPECTOMY WITH RADIOACTIVE SEED AND AXILLARY LYMPH NODE DISSECTION
Anesthesia: General | Site: Chest

## 2017-10-14 MED ORDER — DIPHENHYDRAMINE HCL 50 MG/ML IJ SOLN
INTRAMUSCULAR | Status: DC | PRN
  Administered 2017-10-14: 12.5 mg via INTRAVENOUS

## 2017-10-14 MED ORDER — MIDAZOLAM HCL 2 MG/2ML IJ SOLN
INTRAMUSCULAR | Status: AC
  Administered 2017-10-14: 2 mg via INTRAVENOUS
  Filled 2017-10-14: qty 2

## 2017-10-14 MED ORDER — FENTANYL CITRATE (PF) 250 MCG/5ML IJ SOLN
INTRAMUSCULAR | Status: AC
  Filled 2017-10-14: qty 5

## 2017-10-14 MED ORDER — ONDANSETRON 4 MG PO TBDP: 4.0000 mg | ORAL_TABLET | Freq: Four times a day (QID) | ORAL | Status: DC | PRN

## 2017-10-14 MED ORDER — ROCURONIUM BROMIDE 100 MG/10ML IV SOLN
INTRAVENOUS | Status: DC | PRN
  Administered 2017-10-14: 20 mg via INTRAVENOUS
  Administered 2017-10-14: 30 mg via INTRAVENOUS

## 2017-10-14 MED ORDER — CEFAZOLIN SODIUM-DEXTROSE 2-4 GM/100ML-% IV SOLN
2.0000 g | Freq: Three times a day (TID) | INTRAVENOUS | Status: AC
  Administered 2017-10-14: 2 g via INTRAVENOUS
  Filled 2017-10-14: qty 100

## 2017-10-14 MED ORDER — MIDAZOLAM HCL 5 MG/5ML IJ SOLN
INTRAMUSCULAR | Status: DC | PRN
  Administered 2017-10-14: 2 mg via INTRAVENOUS

## 2017-10-14 MED ORDER — ONDANSETRON HCL 4 MG/2ML IJ SOLN: 4.0000 mg | Freq: Four times a day (QID) | INTRAMUSCULAR | Status: DC | PRN

## 2017-10-14 MED ORDER — PROMETHAZINE HCL 25 MG/ML IJ SOLN: 6.2500 mg | INTRAMUSCULAR | Status: DC | PRN

## 2017-10-14 MED ORDER — PHENYLEPHRINE 40 MCG/ML (10ML) SYRINGE FOR IV PUSH (FOR BLOOD PRESSURE SUPPORT)
PREFILLED_SYRINGE | INTRAVENOUS | Status: DC | PRN
  Administered 2017-10-14 (×4): 80 ug via INTRAVENOUS

## 2017-10-14 MED ORDER — BUPIVACAINE-EPINEPHRINE (PF) 0.25% -1:200000 IJ SOLN
INTRAMUSCULAR | Status: AC
  Filled 2017-10-14: qty 30

## 2017-10-14 MED ORDER — DIPHENHYDRAMINE HCL 50 MG/ML IJ SOLN
INTRAMUSCULAR | Status: AC
  Filled 2017-10-14: qty 1

## 2017-10-14 MED ORDER — DIPHENHYDRAMINE HCL 12.5 MG/5ML PO ELIX: 12.5000 mg | ORAL_SOLUTION | Freq: Four times a day (QID) | ORAL | Status: DC | PRN

## 2017-10-14 MED ORDER — LIDOCAINE HCL 1 % IJ SOLN
INTRAMUSCULAR | Status: AC
  Filled 2017-10-14: qty 20

## 2017-10-14 MED ORDER — LIDOCAINE HCL (PF) 1 % IJ SOLN
INTRAMUSCULAR | Status: AC
  Filled 2017-10-14: qty 30

## 2017-10-14 MED ORDER — FENTANYL CITRATE (PF) 100 MCG/2ML IJ SOLN: INTRAMUSCULAR | Status: DC | PRN

## 2017-10-14 MED ORDER — BUPIVACAINE-EPINEPHRINE (PF) 0.5% -1:200000 IJ SOLN
INTRAMUSCULAR | Status: DC | PRN
  Administered 2017-10-14: 30 mL via PERINEURAL

## 2017-10-14 MED ORDER — DEXTROSE IN LACTATED RINGERS 5 % IV SOLN
INTRAVENOUS | Status: DC
  Administered 2017-10-14 – 2017-10-15 (×2): via INTRAVENOUS

## 2017-10-14 MED ORDER — LIDOCAINE 2% (20 MG/ML) 5 ML SYRINGE
INTRAMUSCULAR | Status: DC | PRN
  Administered 2017-10-14: 100 mg via INTRAVENOUS

## 2017-10-14 MED ORDER — SENNA 8.6 MG PO TABS
1.0000 | ORAL_TABLET | Freq: Two times a day (BID) | ORAL | Status: DC
  Administered 2017-10-14 – 2017-10-15 (×2): 8.6 mg via ORAL
  Filled 2017-10-14 (×2): qty 1

## 2017-10-14 MED ORDER — MIDAZOLAM HCL 2 MG/2ML IJ SOLN
INTRAMUSCULAR | Status: AC
  Filled 2017-10-14: qty 2

## 2017-10-14 MED ORDER — FENTANYL CITRATE (PF) 100 MCG/2ML IJ SOLN
INTRAMUSCULAR | Status: AC
  Administered 2017-10-14: 50 ug via INTRAVENOUS
  Filled 2017-10-14: qty 2

## 2017-10-14 MED ORDER — MIDAZOLAM HCL 2 MG/2ML IJ SOLN
2.0000 mg | Freq: Once | INTRAMUSCULAR | Status: AC
  Administered 2017-10-14: 2 mg via INTRAVENOUS

## 2017-10-14 MED ORDER — ONDANSETRON HCL 4 MG/2ML IJ SOLN
INTRAMUSCULAR | Status: AC
  Filled 2017-10-14: qty 2

## 2017-10-14 MED ORDER — CHLORHEXIDINE GLUCONATE CLOTH 2 % EX PADS: 6.0000 | MEDICATED_PAD | Freq: Once | CUTANEOUS | Status: DC

## 2017-10-14 MED ORDER — FENTANYL CITRATE (PF) 100 MCG/2ML IJ SOLN
INTRAMUSCULAR | Status: DC | PRN
  Administered 2017-10-14: 50 ug via INTRAVENOUS
  Administered 2017-10-14: 100 ug via INTRAVENOUS
  Administered 2017-10-14 (×2): 50 ug via INTRAVENOUS

## 2017-10-14 MED ORDER — FENTANYL CITRATE (PF) 100 MCG/2ML IJ SOLN
50.0000 ug | Freq: Once | INTRAMUSCULAR | Status: AC
  Administered 2017-10-14: 50 ug via INTRAVENOUS

## 2017-10-14 MED ORDER — IBUPROFEN 400 MG PO TABS
400.0000 mg | ORAL_TABLET | Freq: Four times a day (QID) | ORAL | Status: DC | PRN
  Administered 2017-10-14: 400 mg via ORAL
  Filled 2017-10-14: qty 1

## 2017-10-14 MED ORDER — METHOCARBAMOL 500 MG PO TABS: 500.0000 mg | ORAL_TABLET | Freq: Four times a day (QID) | ORAL | Status: DC | PRN

## 2017-10-14 MED ORDER — DEXAMETHASONE SODIUM PHOSPHATE 10 MG/ML IJ SOLN
INTRAMUSCULAR | Status: DC | PRN
  Administered 2017-10-14: 10 mg via INTRAVENOUS

## 2017-10-14 MED ORDER — ACETAMINOPHEN 500 MG PO TABS
1000.0000 mg | ORAL_TABLET | ORAL | Status: AC
  Administered 2017-10-14: 500 mg via ORAL
  Filled 2017-10-14: qty 2

## 2017-10-14 MED ORDER — LACTATED RINGERS IV SOLN
INTRAVENOUS | Status: DC
  Administered 2017-10-14 (×2): via INTRAVENOUS

## 2017-10-14 MED ORDER — PROPOFOL 10 MG/ML IV BOLUS
INTRAVENOUS | Status: DC | PRN
  Administered 2017-10-14: 150 mg via INTRAVENOUS
  Administered 2017-10-14: 50 mg via INTRAVENOUS

## 2017-10-14 MED ORDER — 0.9 % SODIUM CHLORIDE (POUR BTL) OPTIME
TOPICAL | Status: DC | PRN
  Administered 2017-10-14: 1000 mL

## 2017-10-14 MED ORDER — FENTANYL CITRATE (PF) 100 MCG/2ML IJ SOLN: 25.0000 ug | INTRAMUSCULAR | Status: DC | PRN

## 2017-10-14 MED ORDER — GABAPENTIN 300 MG PO CAPS
300.0000 mg | ORAL_CAPSULE | ORAL | Status: AC
  Administered 2017-10-14: 300 mg via ORAL
  Filled 2017-10-14: qty 1

## 2017-10-14 MED ORDER — SUCCINYLCHOLINE CHLORIDE 20 MG/ML IJ SOLN
INTRAMUSCULAR | Status: DC | PRN
  Administered 2017-10-14: 100 mg via INTRAVENOUS

## 2017-10-14 MED ORDER — DEXAMETHASONE SODIUM PHOSPHATE 10 MG/ML IJ SOLN
INTRAMUSCULAR | Status: AC
  Filled 2017-10-14: qty 1

## 2017-10-14 MED ORDER — CEFAZOLIN SODIUM-DEXTROSE 2-4 GM/100ML-% IV SOLN
2.0000 g | INTRAVENOUS | Status: AC
  Administered 2017-10-14: 2 g via INTRAVENOUS
  Filled 2017-10-14: qty 100

## 2017-10-14 MED ORDER — CELECOXIB 200 MG PO CAPS
200.0000 mg | ORAL_CAPSULE | ORAL | Status: AC
  Administered 2017-10-14: 200 mg via ORAL
  Filled 2017-10-14: qty 1

## 2017-10-14 MED ORDER — SUGAMMADEX SODIUM 200 MG/2ML IV SOLN
INTRAVENOUS | Status: DC | PRN
  Administered 2017-10-14: 200 mg via INTRAVENOUS

## 2017-10-14 MED ORDER — DIPHENHYDRAMINE HCL 50 MG/ML IJ SOLN: 12.5000 mg | Freq: Four times a day (QID) | INTRAMUSCULAR | Status: DC | PRN

## 2017-10-14 MED ORDER — SIMETHICONE 80 MG PO CHEW: 40.0000 mg | CHEWABLE_TABLET | Freq: Four times a day (QID) | ORAL | Status: DC | PRN

## 2017-10-14 MED ORDER — ONDANSETRON HCL 4 MG/2ML IJ SOLN
INTRAMUSCULAR | Status: DC | PRN
  Administered 2017-10-14: 4 mg via INTRAVENOUS

## 2017-10-14 MED ORDER — CHLORHEXIDINE GLUCONATE CLOTH 2 % EX PADS
6.0000 | MEDICATED_PAD | Freq: Once | CUTANEOUS | Status: DC
  Administered 2017-10-14: 6 via TOPICAL

## 2017-10-14 MED ORDER — LIDOCAINE HCL 1 % IJ SOLN
INTRAMUSCULAR | Status: DC | PRN
  Administered 2017-10-14: 14:00:00 via INTRAMUSCULAR

## 2017-10-14 SURGICAL SUPPLY — 61 items
BINDER BREAST XXLRG (GAUZE/BANDAGES/DRESSINGS) ×4 IMPLANT
BIOPATCH RED 1 DISK 7.0 (GAUZE/BANDAGES/DRESSINGS) ×3 IMPLANT
BIOPATCH RED 1IN DISK 7.0MM (GAUZE/BANDAGES/DRESSINGS) ×1
BLADE SURG 15 STRL LF DISP TIS (BLADE) ×2 IMPLANT
BLADE SURG 15 STRL SS (BLADE) ×2
BNDG COHESIVE 4X5 TAN STRL (GAUZE/BANDAGES/DRESSINGS) ×4 IMPLANT
CHLORAPREP W/TINT 10.5 ML (MISCELLANEOUS) ×4 IMPLANT
CLIP VESOCCLUDE LG 6/CT (CLIP) ×8 IMPLANT
CLIP VESOCCLUDE MED 24/CT (CLIP) ×4 IMPLANT
CLIP VESOCCLUDE MED 6/CT (CLIP) ×4 IMPLANT
CLOSURE WOUND 1/2 X4 (GAUZE/BANDAGES/DRESSINGS) ×1
COVER SURGICAL LIGHT HANDLE (MISCELLANEOUS) ×4 IMPLANT
DECANTER SPIKE VIAL GLASS SM (MISCELLANEOUS) ×8 IMPLANT
DERMABOND ADVANCED (GAUZE/BANDAGES/DRESSINGS) ×2
DERMABOND ADVANCED .7 DNX12 (GAUZE/BANDAGES/DRESSINGS) ×2 IMPLANT
DRAIN CHANNEL 19F RND (DRAIN) ×4 IMPLANT
DRAPE LAPAROTOMY 100X72 PEDS (DRAPES) IMPLANT
DRAPE SURG 17X23 STRL (DRAPES) ×4 IMPLANT
DRAPE UTILITY XL STRL (DRAPES) ×4 IMPLANT
ELECT CAUTERY BLADE 6.4 (BLADE) IMPLANT
ELECT COATED BLADE 2.86 ST (ELECTRODE) ×4 IMPLANT
ELECT REM PT RETURN 9FT ADLT (ELECTROSURGICAL) ×4
ELECTRODE REM PT RTRN 9FT ADLT (ELECTROSURGICAL) ×2 IMPLANT
EVACUATOR SILICONE 100CC (DRAIN) ×4 IMPLANT
GAUZE SPONGE 4X4 12PLY STRL LF (GAUZE/BANDAGES/DRESSINGS) ×4 IMPLANT
GAUZE SPONGE 4X4 16PLY XRAY LF (GAUZE/BANDAGES/DRESSINGS) ×4 IMPLANT
GLOVE BIO SURGEON STRL SZ 6 (GLOVE) ×4 IMPLANT
GLOVE BIO SURGEON STRL SZ7.5 (GLOVE) ×4 IMPLANT
GLOVE INDICATOR 6.5 STRL GRN (GLOVE) ×4 IMPLANT
GLOVE INDICATOR 7.5 STRL GRN (GLOVE) ×4 IMPLANT
GOWN STRL REUS W/ TWL LRG LVL3 (GOWN DISPOSABLE) ×6 IMPLANT
GOWN STRL REUS W/TWL 2XL LVL3 (GOWN DISPOSABLE) ×4 IMPLANT
GOWN STRL REUS W/TWL LRG LVL3 (GOWN DISPOSABLE) ×6
KIT BASIN OR (CUSTOM PROCEDURE TRAY) ×4 IMPLANT
KIT MARKER MARGIN INK (KITS) ×4 IMPLANT
KIT TURNOVER KIT B (KITS) ×4 IMPLANT
LIGHT WAVEGUIDE WIDE FLAT (MISCELLANEOUS) ×4 IMPLANT
NEEDLE HYPO 25GX1X1/2 BEV (NEEDLE) ×4 IMPLANT
NS IRRIG 1000ML POUR BTL (IV SOLUTION) ×4 IMPLANT
PACK GENERAL/GYN (CUSTOM PROCEDURE TRAY) ×4 IMPLANT
PACK SURGICAL SETUP 50X90 (CUSTOM PROCEDURE TRAY) IMPLANT
PACK UNIVERSAL I (CUSTOM PROCEDURE TRAY) ×4 IMPLANT
PAD ABD 8X10 STRL (GAUZE/BANDAGES/DRESSINGS) ×4 IMPLANT
PAD ARMBOARD 7.5X6 YLW CONV (MISCELLANEOUS) ×8 IMPLANT
PENCIL BUTTON HOLSTER BLD 10FT (ELECTRODE) ×8 IMPLANT
SPECIMEN JAR MEDIUM (MISCELLANEOUS) ×4 IMPLANT
SPONGE LAP 18X18 RF (DISPOSABLE) ×4 IMPLANT
SPONGE LAP 18X18 X RAY DECT (DISPOSABLE) ×8 IMPLANT
STOCKINETTE IMPERVIOUS LG (DRAPES) ×4 IMPLANT
STRIP CLOSURE SKIN 1/2X4 (GAUZE/BANDAGES/DRESSINGS) ×3 IMPLANT
SUT ETHILON 2 0 FS 18 (SUTURE) ×4 IMPLANT
SUT MON AB 4-0 PC3 18 (SUTURE) ×16 IMPLANT
SUT VIC AB 2-0 CT1 36 (SUTURE) ×4 IMPLANT
SUT VIC AB 2-0 SH 27 (SUTURE) ×4
SUT VIC AB 2-0 SH 27XBRD (SUTURE) ×4 IMPLANT
SUT VIC AB 3-0 SH 27 (SUTURE) ×12
SUT VIC AB 3-0 SH 27X BRD (SUTURE) ×10 IMPLANT
SUT VIC AB 3-0 SH 27XBRD (SUTURE) ×2 IMPLANT
SYR CONTROL 10ML LL (SYRINGE) ×4 IMPLANT
TOWEL OR 17X24 6PK STRL BLUE (TOWEL DISPOSABLE) ×4 IMPLANT
TOWEL OR 17X26 10 PK STRL BLUE (TOWEL DISPOSABLE) ×4 IMPLANT

## 2017-10-14 NOTE — Anesthesia Procedure Notes (Signed)
Procedure Name: Intubation Date/Time: 10/14/2017 12:59 PM Performed by: Catalina Gravel, MD Pre-anesthesia Checklist: Patient identified, Emergency Drugs available, Suction available and Patient being monitored Patient Re-evaluated:Patient Re-evaluated prior to induction Oxygen Delivery Method: Circle System Utilized Preoxygenation: Pre-oxygenation with 100% oxygen Induction Type: IV induction and Cricoid Pressure applied Ventilation: Mask ventilation without difficulty and Oral airway inserted - appropriate to patient size Laryngoscope Size: Mac and 3 Grade View: Grade II Tube type: Oral Tube size: 7.0 mm Number of attempts: 2 (DVL x1 by SRNA with good view but no success passing tube due to small mouth opening, DVL x1 by CRNA with success) Airway Equipment and Method: Stylet and Oral airway Placement Confirmation: ETT inserted through vocal cords under direct vision,  positive ETCO2 and breath sounds checked- equal and bilateral Secured at: 22 cm Tube secured with: Tape Dental Injury: Teeth and Oropharynx as per pre-operative assessment

## 2017-10-14 NOTE — Anesthesia Procedure Notes (Signed)
Anesthesia Regional Block: Pectoralis block   Pre-Anesthetic Checklist: ,, timeout performed, Correct Patient, Correct Site, Correct Laterality, Correct Procedure, Correct Position, site marked, Risks and benefits discussed,  Surgical consent,  Pre-op evaluation,  At surgeon's request and post-op pain management  Laterality: Left  Prep: chloraprep       Needles:  Injection technique: Single-shot  Needle Type: Echogenic Needle     Needle Length: 9cm  Needle Gauge: 21     Additional Needles:   Procedures:,,,, ultrasound used (permanent image in chart),,,,  Narrative:  Start time: 10/14/2017 11:35 AM End time: 10/14/2017 11:40 AM Injection made incrementally with aspirations every 5 mL.  Performed by: Personally  Anesthesiologist: Catalina Gravel, MD  Additional Notes: No pain on injection. No increased resistance to injection. Injection made in 5cc increments.  Good needle visualization.  Patient tolerated procedure well.

## 2017-10-14 NOTE — Discharge Instructions (Addendum)
Surgical Drain Home Care °Surgical drains are used to remove extra fluid that normally builds up in a surgical wound after surgery. A surgical drain helps to heal a surgical wound. Different kinds of surgical drains include: °· Active drains. These drains use suction to pull drainage away from the surgical wound. Drainage flows through a tube to a container outside of the body. It is important to keep the bulb or the drainage container flat (compressed) at all times, except while you empty it. Flattening the bulb or container creates suction. The two most common types of active drains are bulb drains and Hemovac drains. °· Passive drains. These drains allow fluid to drain naturally, by gravity. Drainage flows through a tube to a bandage (dressing) or a container outside of the body. Passive drains do not need to be emptied. The most common type of passive drain is the Penrose drain. ° °A drain is placed during surgery. Immediately after surgery, drainage is usually bright red and a little thicker than water. The drainage may gradually turn yellow or pink and become thinner. It is likely that your health care provider will remove the drain when the drainage stops or when the amount decreases to 1-2 Tbsp (15-30 mL) during a 24-hour period. °How to care for your surgical drain °· Keep the skin around the drain dry and covered with a dressing at all times. °· Check your drain area every day for signs of infection. Check for: °? More redness, swelling, or pain. °? Pus or a bad smell. °? Cloudy drainage. °Follow instructions from your health care provider about how to take care of your drain and how to change your dressing. Change your dressing at least one time every day. Change it more often if needed to keep the dressing dry. Make sure you: °1. Gather your supplies, including: °? Tape. °? Germ-free cleaning solution (sterile saline). °? Split gauze drain sponge: 4 x 4 inches (10 x 10 cm). °? Gauze square: 4 x 4 inches  (10 x 10 cm). °2. Wash your hands with soap and water before you change your dressing. If soap and water are not available, use hand sanitizer. °3. Remove the old dressing. Avoid using scissors to do that. °4. Use sterile saline to clean your skin around the drain. °5. Place the tube through the slit in a drain sponge. Place the drain sponge so that it covers your wound. °6. Place the gauze square or another drain sponge on top of the drain sponge that is on the wound. Make sure the tube is between those layers. °7. Tape the dressing to your skin. °8. If you have an active bulb or Hemovac drain, tape the drainage tube to your skin 1-2 inches (2.5-5 cm) below the place where the tube enters your body. Taping keeps the tube from pulling on any stitches (sutures) that you have. °9. Wash your hands with soap and water. °10. Write down the color of your drainage and how often you change your dressing. ° °How to empty your active bulb or Hemovac drain °1. Make sure that you have a measuring cup that you can empty your drainage into. °2. Wash your hands with soap and water. If soap and water are not available, use hand sanitizer. °3. Gently move your fingers down the tube while squeezing very lightly. This is called stripping the tube. This clears any drainage, clots, or tissue from the tube. °? Do not pull on the tube. °? You may need to strip   the tube several times every day to keep the tube clear. 4. Open the bulb cap or the drain plug. Do not touch the inside of the cap or the bottom of the plug. 5. Empty all of the drainage into the measuring cup. 6. Compress the bulb or the container and replace the cap or the plug. To compress the bulb or the container, squeeze it firmly in the middle while you close the cap or plug the container. 7. Write down the amount of drainage that you have in each 24-hour period. If you have less than 2 Tbsp (30 mL) of drainage during 24 hours, contact your health care  provider. 8. Flush the drainage down the toilet. 9. Wash your hands with soap and water. Contact a health care provider if:  You have more redness, swelling, or pain around your drain area.  The amount of drainage that you have is increasing instead of decreasing.  You have pus or a bad smell coming from your drain area.  You have a fever.  You have drainage that is cloudy.  There is a sudden stop or a sudden decrease in the amount of drainage that you have.  Your tube falls out.  Your active draindoes not stay compressedafter you empty it. This information is not intended to replace advice given to you by your health care provider. Make sure you discuss any questions you have with your health care provider. Document Released: 04/03/2000 Document Revised: 09/12/2015 Document Reviewed: 10/24/2014 Elsevier Interactive Patient Education  2018 [place] Office Phone Number [phone]   POST OP INSTRUCTIONS  Always review your discharge instruction sheet given to you by the facility where your surgery was performed.  IF YOU HAVE DISABILITY OR FAMILY LEAVE FORMS, YOU MUST BRING THEM TO THE OFFICE FOR PROCESSING.  DO NOT GIVE THEM TO YOUR DOCTOR.  1. A prescription for pain medication may be given to you upon discharge.  Take your pain medication as prescribed, if needed.  If narcotic pain medicine is not needed, then you may take acetaminophen (Tylenol) or ibuprofen (Advil) as needed. 2. Take your usually prescribed medications unless otherwise directed 3. If you need a refill on your pain medication, please contact your pharmacy.  They will contact our office to request authorization.  Prescriptions will not be filled after 5pm or on week-ends. 4. You should eat very light the first 24 hours after surgery, such as soup, crackers, pudding, etc.  Resume your normal diet the day after surgery 5. It is common to experience some constipation if  taking pain medication after surgery.  Increasing fluid intake and taking a stool softener will usually help or prevent this problem from occurring.  A mild laxative (Milk of Magnesia or Miralax) should be taken according to package directions if there are no bowel movements after 48 hours. 6. You may shower in 48 hours.  The surgical glue will flake off in 2-3 weeks.   7. ACTIVITIES:  No strenuous activity or heavy lifting for 1 week.   a. You may drive when you no longer are taking prescription pain medication, you can comfortably wear a seatbelt, and you can safely maneuver your car and apply brakes. b. RETURN TO WORK:  __________to be determined._______________ Dennis Bast should see your doctor in the office for a follow-up appointment approximately three-four weeks after your surgery.    WHEN TO CALL YOUR DOCTOR: 1. Fever over 101.0 2. Nausea and/or vomiting. 3. Extreme swelling  or bruising. 4. Continued bleeding from incision. 5. Increased pain, redness, or drainage from the incision.  The clinic staff is available to answer your questions during regular business hours.  Please dont hesitate to call and ask to speak to one of the nurses for clinical concerns.  If you have a medical emergency, go to the nearest emergency room or call 911.  A surgeon from [facility] is always on call at the hospital.  For further questions, please visit [email or web address]

## 2017-10-14 NOTE — Transfer of Care (Signed)
Immediate Anesthesia Transfer of Care Note  Patient: Carrie Miller  Procedure(s) Performed: LEFT BREAST LUMPECTOMY X 2 WITH RADIOACTIVE SEED X  2 AND LEFT AXILLARY LYMPH NODE DISSECTION (Left Breast) REMOVAL PORT-A-CATH (N/A Chest)  Patient Location: PACU  Anesthesia Type:General  Level of Consciousness: drowsy  Airway & Oxygen Therapy: Patient Spontanous Breathing and Patient connected to nasal cannula oxygen  Post-op Assessment: Report given to RN and Post -op Vital signs reviewed and stable  Post vital signs: Reviewed and stable  Last Vitals:  Vitals Value Taken Time  BP 123/80 10/14/2017  4:02 PM  Temp    Pulse 89 10/14/2017  4:13 PM  Resp 17 10/14/2017  4:13 PM  SpO2 94 % 10/14/2017  4:13 PM  Vitals shown include unvalidated device data.  Last Pain:  Vitals:   10/14/17 1550  TempSrc:   PainSc: Asleep         Complications: No apparent anesthesia complications

## 2017-10-14 NOTE — Anesthesia Preprocedure Evaluation (Signed)
Anesthesia Evaluation  Patient identified by MRN, date of birth, ID band Patient awake    Reviewed: Allergy & Precautions, NPO status , Patient's Chart, lab work & pertinent test results  Airway Mallampati: II  TM Distance: >3 FB Neck ROM: Full    Dental  (+) Teeth Intact, Dental Advisory Given   Pulmonary neg pulmonary ROS,    Pulmonary exam normal breath sounds clear to auscultation       Cardiovascular Exercise Tolerance: Good negative cardio ROS Normal cardiovascular exam Rhythm:Regular Rate:Normal     Neuro/Psych  Neuromuscular disease (Peripheral neuropathy) negative psych ROS   GI/Hepatic negative GI ROS, Neg liver ROS,   Endo/Other  Obesity   Renal/GU negative Renal ROS     Musculoskeletal negative musculoskeletal ROS (+)   Abdominal   Peds  Hematology negative hematology ROS (+)   Anesthesia Other Findings Day of surgery medications reviewed with the patient.  LEFT BREAST CANCER  Reproductive/Obstetrics                             Anesthesia Physical Anesthesia Plan  ASA: II  Anesthesia Plan: General   Post-op Pain Management:  Regional for Post-op pain   Induction: Intravenous  PONV Risk Score and Plan: 3 and Midazolam, Dexamethasone and Ondansetron  Airway Management Planned: LMA  Additional Equipment:   Intra-op Plan:   Post-operative Plan: Extubation in OR  Informed Consent: I have reviewed the patients History and Physical, chart, labs and discussed the procedure including the risks, benefits and alternatives for the proposed anesthesia with the patient or authorized representative who has indicated his/her understanding and acceptance.   Dental advisory given  Plan Discussed with: CRNA  Anesthesia Plan Comments:         Anesthesia Quick Evaluation

## 2017-10-14 NOTE — Anesthesia Postprocedure Evaluation (Signed)
Anesthesia Post Note  Patient: Carrie Miller  Procedure(s) Performed: LEFT BREAST LUMPECTOMY X 2 WITH RADIOACTIVE SEED X  2 AND LEFT AXILLARY LYMPH NODE DISSECTION (Left Breast) REMOVAL PORT-A-CATH (N/A Chest)     Patient location during evaluation: PACU Anesthesia Type: General Level of consciousness: awake and alert Pain management: pain level controlled Vital Signs Assessment: post-procedure vital signs reviewed and stable Respiratory status: spontaneous breathing, nonlabored ventilation and respiratory function stable Cardiovascular status: blood pressure returned to baseline and stable Postop Assessment: no apparent nausea or vomiting Anesthetic complications: no    Last Vitals:  Vitals:   10/14/17 2035 10/14/17 2110  BP: 130/83 130/83  Pulse: (!) 101 100  Resp: 16 16  Temp: 36.7 C 36.7 C  SpO2: 95%     Last Pain:  Vitals:   10/14/17 2110  TempSrc: Oral  PainSc:                  Catalina Gravel

## 2017-10-14 NOTE — Op Note (Signed)
Left Breast Radioactive seed localized lumpectomy x 2, left axillary lymph node dissection, and right internal jugular port removal.    Indications: This patient presents with history of left breast cancer, cmT3N1M0, grade 3, triple negative, s/p neoadjuvant chemotherapy, good response on MRI  Pre-operative Diagnosis: left breast cancer  Post-operative Diagnosis: Same  Surgeon: [name]   Anesthesia: General endotracheal anesthesia  ASA Class: 2  Procedure Details  The patient was seen in the Holding Room. The risks, benefits, complications, treatment options, and expected outcomes were discussed with the patient. The possibilities of bleeding, infection, the need for additional procedures, failure to diagnose a condition, and creating a complication requiring transfusion or operation were discussed with the patient. The patient concurred with the proposed plan, giving informed consent.  The site of surgery properly noted/marked. The patient was taken to Operating Room # 2, identified, and the procedure verified as left Breast seed localized Lumpectomy x 2, left axillary lymph node dissection, right port removal. A Time Out was held and the above information confirmed.  The left arm, bilateral breast, and chest were prepped and draped in standard fashion. The port removal was removed first.  The right port site was anesthetized with local anesthetic.  The previous incision was opened and the port was identified.  The port was freed up from around the surrounding tissue.  The port was removed.  The catheter was cut at the 34 cm mark.  Pressure was held on the port tract for 2 minutes.  The incision was closed with 3-0 vicryl interrupted deep dermal sutures and 4-0 monocryl running subcuticular suture.    The lumpectomy was performed by creating an transverse incision laterally on the breast between the previously placed radioactive seeds.  The UOQ was addressed first.  Dissection was carried  down to around the point of maximum signal intensity. The cautery was used to perform the dissection.  Hemostasis was achieved with cautery. The edges of the cavity were marked with large clips.   The specimen was inked with the margin marker paint kit.    Specimen radiography confirmed inclusion of the mammographic lesion, the clip, and the seed. Through the same incision, the LOQ lesion was taken.  Clips were placed in this cavity as well.  There was some intermediate tissue in between.  The background signal in the breast was zero.  The wound was irrigated and closed with 3-0 vicryl in layers and 4-0 monocryl subcuticular suture.    An curvalinear incision was made in the axilla. An axillary dissection was performed with removal of the associated lymph nodes and surrounding adipose tissue. This included levels I and II. This was accomplished by exposing the axillary vein anteriorly and inferiorly to the level of the pectoralis minor and laterally over the latissimus dorsi muscle. Posteriorly, the dissection continued to the subscapularis.  Small venous tributaries, lymphatics, and vessels were clipped and ligated or cauterized and divided. The subscapularis muscle was skeletonized. The long thoracic and thoracodorsal neurovascular bundles were identified and preserved.  The wound was irrigated and closed with a 3-0 Vicryl deep dermal interrupted and a 4-0 vicryl subcuticular closure in layers.   Sterile dressings were applied. At the end of the operation, all sponge, instrument, and needle counts were correct.  Findings: grossly clear surgical margins and no adenopathy,  Left breast skin is superior margin of UOQ lumpectomy.  Pectoralis fascia is the posterior margin and skin is inferior margin of the LOQ lumpectomy.    Estimated Blood Loss:  <  50 mL         Specimens: left breast UOQ lumpectomy, left breast LOQ lumpectomy and left axillary contents            Complications:  None; patient tolerated  the procedure well.         Disposition: PACU - hemodynamically stable.         Condition: stable

## 2017-10-14 NOTE — Interval H&P Note (Signed)
History and Physical Interval Note:  10/14/2017 12:26 PM  Carrie Miller  has presented today for surgery, with the diagnosis of LEFT BREAST CANCER  The various methods of treatment have been discussed with the patient and family. After consideration of risks, benefits and other options for treatment, the patient has consented to  Procedure(s): LEFT BREAST LUMPECTOMY X'S 2 WITH RADIOACTIVE SEED X'S 2 AND LEFT AXILLARY LYMPH NODE DISSECTION (Left) REMOVAL PORT-A-CATH (N/A) as a surgical intervention .  The patient's history has been reviewed, patient examined, no change in status, stable for surgery.  I have reviewed the patient's chart and labs.  Questions were answered to the patient's satisfaction.     [name]

## 2017-10-14 NOTE — Progress Notes (Signed)
Patient arrived to unit at approximately 17:20 PM this day, via stretcher.  She walked from stretcher to bed, with 1 person assistance.  Pt's 2 brothers have been at her side, and aiding with her care. Pt is tolerating clear liquids at this time. She did get up and go the BRP at change of shift and voided, and was placed in chair. Pt did have Port on right side of chest, which was removed, and skin glue is over site.

## 2017-10-15 ENCOUNTER — Encounter (HOSPITAL_COMMUNITY): Payer: Self-pay | Admitting: General Surgery

## 2017-10-15 DIAGNOSIS — C50412 Malignant neoplasm of upper-outer quadrant of left female breast: Secondary | ICD-10-CM | POA: Diagnosis not present

## 2017-10-15 LAB — BASIC METABOLIC PANEL
Anion gap: 8 (ref 5–15)
BUN: 7 mg/dL (ref 6–20)
CO2: 24 mmol/L (ref 22–32)
Calcium: 9.3 mg/dL (ref 8.9–10.3)
Chloride: 107 mmol/L (ref 98–111)
Creatinine, Ser: 0.6 mg/dL (ref 0.44–1.00)
Glucose, Bld: 150 mg/dL — ABNORMAL HIGH (ref 70–99)
POTASSIUM: 3.9 mmol/L (ref 3.5–5.1)
SODIUM: 139 mmol/L (ref 135–145)

## 2017-10-15 LAB — CBC
HCT: 31.3 % — ABNORMAL LOW (ref 36.0–46.0)
HEMOGLOBIN: 10.3 g/dL — AB (ref 12.0–15.0)
MCH: 32.6 pg (ref 26.0–34.0)
MCHC: 32.9 g/dL (ref 30.0–36.0)
MCV: 99.1 fL (ref 78.0–100.0)
PLATELETS: 226 10*3/uL (ref 150–400)
RBC: 3.16 MIL/uL — ABNORMAL LOW (ref 3.87–5.11)
RDW: 15.7 % — ABNORMAL HIGH (ref 11.5–15.5)
WBC: 10.7 10*3/uL — ABNORMAL HIGH (ref 4.0–10.5)

## 2017-10-15 MED ORDER — ACETAMINOPHEN 325 MG PO TABS: 650.0000 mg | ORAL_TABLET | ORAL | 2 refills | Status: AC | PRN

## 2017-10-15 MED ORDER — SENNA 8.6 MG PO TABS: 1.0000 | ORAL_TABLET | Freq: Two times a day (BID) | ORAL | 0 refills | Status: DC

## 2017-10-15 MED ORDER — OXYCODONE HCL 5 MG PO TABS: 5.0000 mg | ORAL_TABLET | Freq: Four times a day (QID) | ORAL | 0 refills | Status: AC | PRN

## 2017-10-15 MED FILL — oxyCODONE HCL 5 MG TABS: 5 | 3 days supply | Qty: 10 | Fill #0

## 2017-10-15 NOTE — Progress Notes (Signed)
Patient discharged to home. Verbalizes understanding of all discharge instructions including incision care, discharge medications, and follow up MD visits. Patient accompanied by brothers. Discharge papers and instructions reviewed with brothers present.

## 2017-10-18 NOTE — Progress Notes (Signed)
Please let patient know no residual cancer, margins, lymph nodes all OK.  No additional surgery!

## 2017-10-20 NOTE — Progress Notes (Signed)
[place]  [phone] [phone]  Clinic Follow Up Note   Patient Care Team: Patient, No Pcp Per as PCP - General ([place]) [name], MD as Consulting Physician (General Surgery) [name], MD as Consulting Physician (Hematology) [name], NP as Nurse Practitioner (Nurse Practitioner)   Date of Service:  10/25/2017  CHIEF COMPLAINTS:  Follow up left breast cancer, triple negative   Oncology History   Cancer Staging Cancer of overlapping sites of left breast [facility]) Staging form: Breast, AJCC 8th Edition - Clinical stage from 04/19/2017: Stage IIIC (cT3(m), cN1, cM0, G3, ER: Negative, PR: Negative, HER2: Negative) - Signed by [name], MD on 04/28/2017 - Pathologic stage from 10/14/2017: No Stage Recommended (ypT0, pN0, cM0, GX, ER: Not Assessed, PR: Not Assessed, HER2: Not Assessed) - Signed by [name], MD on 10/24/2017       Cancer of overlapping sites of left breast ([place])   04/15/2017 Mammogram    IMPRESSION: 1. Highly suspicious irregular mass within the left breast at the 4 o'clock axis, 8 cm from the nipple, measuring 5.2 cm, corresponding to the area of clinical concern. Ultrasound-guided biopsy is recommended. 2. Additional suspicious irregular mass within the left breast at the 2 o'clock axis, 8 cm from the nipple, measuring 1.6 cm, corresponding to the mammographic finding. Ultrasound-guided biopsy is recommended. 3. Additional suspicious mass within the left breast at the 2 o'clock axis, 10 cm from the nipple, axillary tail region, measuring 3 cm, suspected lymph node completely replaced by tumor. Ultrasound-guided biopsy is recommended. 4. No evidence of malignancy within the right breast.       04/19/2017 Initial Biopsy    Diagnosis 1. Breast, left, needle core biopsy, 4:00 o'clock, ribbon clip - INVASIVE DUCTAL CARCINOMA. - SEE COMMENT. 2. Breast, left, needle core biopsy, 2:00 o'clock, coil clip -  INVASIVE DUCTAL CARCINOMA. - SEE COMMENT. 3. Lymph node, needle/core biopsy, left axilla, spiral hydromark - DUCTAL CARCINOMA. - SEE COMMENT.  2. PROGNOSTIC INDICATORS Results: IMMUNOHISTOCHEMICAL AND MORPHOMETRIC ANALYSIS PERFORMED MANUALLY Estrogen Receptor: 0%, NEGATIVE Progesterone Receptor: 0%, NEGATIVE Proliferation Marker Ki67: 80%  2. FLUORESCENCE IN-SITU HYBRIDIZATION Results: HER2 - NEGATIVE RATIO OF HER2/CEP17 SIGNALS 1.53 AVERAGE HER2 COPY NUMBER PER CELL 2.75  Microscopic Comment 1. The carcinoma in the three specimens is morphologically similar and is grade III. Lymph nodal tissue is not definitively identified in specimen #3. A breast prognostic profile will be performed on part 2 and the results reported separately.      04/19/2017 Initial Diagnosis    Cancer of overlapping sites of left breast ([place])      04/30/2017 Breast MRI    IMPRESSION: 1. Biopsy-proven invasive carcinoma within the lower outer quadrant of the LEFT breast, 4 o'clock axis, at posterior depth, measuring 4.8 cm, with associated biopsy clip artifact. 2. Biopsy-proven invasive carcinoma within the upper-outer quadrant of the LEFT breast, 2 o'clock axis, at posterior depth, measuring 2.5 cm, with associated biopsy clip artifact. However, contiguous non mass enhancement along the posterior margin of this 2 o'clock mass and extending 3 cm anteriorly from the mass increases the overall measurement to 5.5 cm greatest dimension (AP). 3. Diffuse edema throughout the LEFT breast, with particularly prominent component of edema extending posteriorly to abut the anterior surface of the pectoralis muscle, and diffuse skin thickening throughout the left breast. This almost certainly indicates INFLAMMATORY BREAST CANCER. 4. Biopsy-proven metastatic lymph node within the LEFT axilla measures 4.1 cm. Two additional borderline prominent lymph nodes within  the left axilla. No enlarged lymph nodes within  the right axilla or internal mammary chain regions. 5. No evidence of malignancy within the RIGHT breast.  RECOMMENDATION: Per current treatment plan for patient's known left breast cancer (2 biopsy-proven sites) and metastatic lymph node in the left axilla.  BI-RADS CATEGORY  6: Known biopsy-proven malignancy.       04/30/2017 Echocardiogram    Study Conclusions  - Left ventricle: The cavity size was normal. Wall thickness was   normal. Systolic function was normal. The estimated ejection   fraction was in the range of 60% to 65%. Wall motion was normal;   there were no regional wall motion abnormalities. Left   ventricular diastolic function parameters were normal. GLS:   -20.4%, RV S vel 12.1cm/s.      05/05/2017 Imaging    MRI BRAIN IMPRESSION: No intracranial or calvarial metastatic disease. Normal MRI of the brain.      05/07/2017 Imaging    CT CAP IMPRESSION: Two left breast masses and a grossly abnormal 3.9 cm lower left axillary lymph node, likely correspond to multifocal left breast cancer metastatic to the left axilla. Skin thickening of the left breast, raising concern for an inflammatory breast cancer.  1 cm hypoattenuated lesion in the posterior dome of the liver with internal attenuation slightly higher than water. This may represent a complicated liver cyst, hemangioma or a focus of metastatic disease.  No other findings to suggest distal metastatic disease.       05/07/2017 Imaging    BONE SCAN IMPRESSION: 1. No evidence of osseous metastases. 2. Abnormal uptake in the soft tissues of the left breast likely by the patient's known breast cancer.       05/11/2017 Genetic Testing    Common Cancers panel (47 genes) @ [facility] - No pathoagenic mutations detected Five Variants of Uncertain Significance were detected:  APC c.2438A>G (p.Asn813Ser)  BARD1 c.782T>C (p.Leu261Pro) MSH3 c.1655C>T (p.Thr552Ile)  MSH6 c.2108T>C (p.Met703Thr)  NTHL1  c.470G>C (p.Arg157Pro)   Genes Analyzed: 47 genes on [facility]'s Common Cancers panel (APC, ATM, AXIN2, BARD1, BMPR1A, BRCA1, BRCA2, BRIP1, CDH1, CDK4, CDKN2A, CHEK2, CTNNA1, DICER1, EPCAM, GREM1, HOXB13, KIT, MEN1, MLH1, MSH2, MSH3, MSH6, MUTYH, NBN, NF1, NTHL1, PALB2, PDGFRA, PMS2, POLD1, POLE, PTEN, RAD50, RAD51C, RAD51D, SDHA, SDHB, SDHC, SDHD, SMAD4, SMARCA4, STK11, TP53, TSC1, TSC2, VHL).       05/12/2017 - 09/16/2017 Chemotherapy    1. Neoadjuvant AC q2 weeks x4 cycles then weekly 05/12/17 - 06/24/17 followed by carbo/taxol for 12 weeks starting 07/08/17. Due to poor toleration, changed Taxol to abraxane and added Carboplatin with cycle 2.  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  2. Monthly zoladex injections starting 04/30/17 for the duration of chemotherapy.       08/06/2017 Mammogram    The previously demonstrated rounded mass in the 2 o'clock position of the left breast, containing a coil shaped biopsy marker clip, is also significantly smaller, less well-defined and has interspersed fat. This currently measures 0.7 x 0.5 x 0.4 cm in maximum dimensions, previously 1.8 x 1.5 x 1.3 cm mammographically.  IMPRESSION: 1. No evidence of malignancy at the location of recent palpable concern in the upper outer left breast. 2. Marked decrease in size and conspicuity of the biopsy-proven invasive ductal carcinomas in the 2 o'clock and 4 o'clock positions of the left breast with no discrete residual masses. There is only mild ill-defined residual soft tissue density and interspersed fat at those locations and associated architectural distortion in the  4 o'clock position. 3. Complete imaging resolution of the previously biopsied metastatic left inferior axillary lymph node.      09/23/2017 Imaging    Repeated breast MRI showed 1. Significant positive imaging response to chemotherapy with resolution of the previously seen biopsy-proven malignancy over the 4 o'clock position of the left breast with  only subtle residual non mass enhancement in this location measuring 2.0 x 0.9 x 1.5 cm. 2. Complete imaging response with resolution of the biopsy-proven malignancy over the 2 o'clock position of the left breast. 3. Complete resolution of the previously seen diffuse left breast edema and skin thickening. 4. Near complete resolution of the previously biopsy-proven metastatic left axillary lymph node.      10/14/2017 Pathology Results     Diagnosis 10/14/2017 1. Breast, lumpectomy, Left Upper Outer Quadrant - COMPLETE THERAPEUTIC RESPONSE - NO RESIDUAL CARCINOMA. - BIOPSY SITE AND THERAPY- RELATED CHANGES. - SEE ONCOLOGY TABLE. 2. Breast, lumpectomy, Left Lower Outer Quadrant w/seed - COMPLETE THERAPEUTIC RESPONSE - NO RESIDUAL CARCINOMA. - BIOPSY SITE AND THERAPY- RELATED CHANGES. - SEE ONCOLOGY TABLE. 3. Lymph nodes, regional resection, Left Axillary - SEVENTEEN LYMPH NODES, NEGATIVE FOR CARCINOMA (0/17).      10/14/2017 Surgery    Left breast double lumpectomy and axillary lymph node dissection by Dr. [name].       HISTORY OF PRESENTING ILLNESS: 04/28/17 Carrie Miller 22 y.o. female is here because of newly diagnosed left breast cancer. She was referred by Dr. Barry Dienes. She presents with her brother and his wife; she speaks minimal [place], her family interpreted for her as she refused interpreter services. She initially palpated a left breast mass 6 weeks ago that was painful with breast swelling and felt to be enlarging. Has not had prior mammogram. Denies nipple inversion, discharge, or skin dimpling. She went to [facility] urgent care and was then referred to the breast center for imaging and diagnostics. Diagnostic mammogram showed a dominant mass in the left breast at the 4 o'clock axis, 8 cm from the nipple, measuring 5.2 x 4.3 x 4.3 cm; a satellite mass in the left breast at the 2 o'clock axis, 8 cm from the nipple, measuring 1.6 x 1.3 x 1.3 cm; and a circumscribed hypoechoic mass  in the left breast at the 2 o'clock axis, 10 cm from the nipple, axillary tail measuring 3 x 2.3 x 2.8 cm, suspected lymph node completely replaced by tumor.  Ultrasound-guided biopsy of all 3 masses were positive for invasive ductal carcinoma, ER/PR negative, HER-2 negative, grade 3.    In addition she reports 1 month history of frequent daily headaches with associated dizziness and occasional left eye pain. Pain often wakes her up from sleep, has tried ibuprofen with some relief. Pain usually at the crown of her head, average 5/10 - 9/10 on pain scale. Not sensitive to light or sound. Denies fall. Reports fatigue for 1 week, denies weight loss, decreased appetite, abdominal pain. Over last 2 months she has irregular vaginal bleeding, bleeding 20 days out of the last month. Recently had abnormal PAP smear, colposcopy is pending. She feels very anxious about her diagnosis.  She has no significant past medical history. Her father has prostate cancer diagnosed at age 75. Paternal aunt had "abdominal cancer." a paternal uncle also had prostate cancer diagnosed age 75. Negative family history of breast or GYN cancer. She lives with her family in a home with her parents, brother, and his brother's wife. She does not work or drive. She  lived in [place] and moved to [place] 9 months ago. She has permanent resident card, no health insurance. Has been getting assistance with BCCCP program through [employer].  GYN HISTORY  Menarchal: 7 LMP: currently menstruating  Contraceptive: none HRT: none GP: G0    CURRENT THERAPY:  Pending adjuvant radiation   INTERVAL HISTORY:   Carrie Miller is here for a follow up after lumpectomy on 10/14/2017 done by Dr. [name]. She is here with her brother. She feels good today and is very happy to know she responded well to chemo.  She still has pain in the incision area, for which she uses tylenol. Her pain is 6-7/10 without medication. After tylenol, it  is 1/10.  She takes tylenol 2x/day.  She still has a draining tube. She'll follow-up on 11/08/2017 with her surgeon. She doesn't mind taking radiation therapy. 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 Her hair is growing back. She is still amenorrheic. No headaches. Her port was removed.      MEDICAL HISTORY:  Past Medical History:  Diagnosis Date  . Breast cancer ([place]) left  . Family history of cancer   . Genetic testing 04/29/2017   Common Cancers panel (47 genes) @ [facility] - No pathoagenic mutations detected    SURGICAL HISTORY: Past Surgical History:  Procedure Laterality Date  . BREAST LUMPECTOMY WITH RADIOACTIVE SEED AND AXILLARY LYMPH NODE DISSECTION Left 10/14/2017   Procedure: LEFT BREAST LUMPECTOMY X 2 WITH RADIOACTIVE SEED X  2 AND LEFT AXILLARY LYMPH NODE DISSECTION;  Surgeon: [name], MD;  Location: [place];  Service: General;  Laterality: Left;  . IR FLUORO GUIDE PORT INSERTION RIGHT  05/03/2017  . IR US GUIDE VASC ACCESS RIGHT  05/03/2017  . PORT-A-CATH REMOVAL N/A 10/14/2017   Procedure: REMOVAL PORT-A-CATH;  Surgeon: [name], MD;  Location: [place];  Service: General;  Laterality: N/A;    SOCIAL HISTORY: Social History   Socioeconomic History  . Marital status: Single    Spouse name: Not on file  . Number of children: Not on file  . Years of education: Not on file  . Highest education level: Not on file  Occupational History  . 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 Financial resource strain: Not on file  . Food insecurity:    Worry: Not on file    Inability: Not on file  . Transportation needs:    Medical: Not on file    Non-medical: Not on file  Tobacco Use  . Smoking status: Never Smoker  . Smokeless tobacco: Never Used  Substance and Sexual Activity  . Alcohol use: No    Frequency: Never  . Drug use: No  . Sexual activity: Never    Birth control/protection: None  Lifestyle  . Physical activity:    Days per week:  3 days    Minutes per session: 30 min  . Stress: Not at all  Relationships  . Social connections:    Talks on phone: More than three times a week    Gets together: More than three times a week    Attends religious service: 1 to 4 times per year    Active member of club or organization: No    Attends meetings of clubs or organizations: Never    Relationship status: Never married  . Intimate partner violence:    Fear of current or ex partner: No    Emotionally abused: No    Physically abused:  No    Forced sexual activity: No  Other Topics Concern  . Not on file  Social History Narrative  . Not on file    FAMILY HISTORY: Family History  Problem Relation Age of Onset  . Hyperlipidemia Mother   . Hypertension Mother   . Prostate cancer Father 72       currently 56  . Stomach cancer Paternal Aunt 73       deceased 57s  . Prostate cancer Paternal Uncle 43       deceased 70    ALLERGIES:  is allergic to heparin.  MEDICATIONS:  Current Outpatient Medications  Medication Sig Dispense Refill  . acetaminophen (TYLENOL) 325 MG tablet Take 2 tablets (650 mg total) by mouth every 4 (four) hours as needed. 50 tablet 2  . ALPRAZolam (XANAX) 0.25 MG tablet Take 1 tablet (0.25 mg total) by mouth at bedtime as needed for anxiety. 30 tablet 0  . DULoxetine (CYMBALTA) 30 MG capsule Take 1 capsule (30 mg total) by mouth daily. 30 capsule 0  . ibuprofen (ADVIL,MOTRIN) 200 MG tablet Take 2 tablets (400 mg total) by mouth every 6 (six) hours as needed. 30 tablet 0  . lidocaine-prilocaine (EMLA) cream Apply to affected area once 30 g 3  . LORazepam (ATIVAN) 0.5 MG tablet Take 1 tablet (0.5 mg total) by mouth every 8 (eight) hours as needed (nausea, vomiting). 20 tablet 0  . Melatonin 10 MG TABS Take 10 mg by mouth at bedtime as needed and may repeat dose one time if needed. 30 tablet 1  . ondansetron (ZOFRAN) 8 MG tablet Take 1 tablet (8 mg total) by mouth 2 (two) times daily as needed. Start on  the third day after chemotherapy. 30 tablet 1  . oxyCODONE (OXY IR/ROXICODONE) 5 MG immediate release tablet Take 1 tablet (5 mg total) by mouth every 6 (six) hours as needed for severe pain. 10 tablet 0  . prochlorperazine (COMPAZINE) 10 MG tablet Take 1 tablet (10 mg total) by mouth every 6 (six) hours as needed (Nausea or vomiting). 30 tablet 1  . senna (SENOKOT) 8.6 MG TABS tablet Take 1 tablet (8.6 mg total) by mouth 2 (two) times daily. 20 each 0   No current facility-administered medications for this visit.     REVIEW OF SYSTEMS:   Constitutional: Denies fevers, chills or abnormal night sweats    Eyes: Denies blurriness of vision, double vision or watery eyes Ears, nose, mouth, throat, and face: Denies mucositis or sore throat   Respiratory: Denies cough, dyspnea or wheezes Cardiovascular: Denies palpitation, chest discomfort or lower extremity swelling   Gastrointestinal:  Denies heartburn or change in bowel habits   Skin: Denies abnormal skin rashes (+) change in nail color Lymphatics: Denies new lymphadenopathy or easy bruising Neurological: (+) mild tingling in hands Behavioral/Psych: Mood is stable, no new changes OBGYN: (+) amenorrhea   All other systems were reviewed with the patient and are negative.  PHYSICAL EXAMINATION:  ECOG PERFORMANCE STATUS: 1 - Symptomatic but completely ambulatory  Vitals:   10/25/17 1144  BP: 111/85  Pulse: 74  Resp: 17  Temp: 98.1 F (36.7 C)  SpO2: 100%   Filed Weights   10/25/17 1144  Weight: 206 lb 8 oz (93.7 kg)    GENERAL:alert, no distress and comfortable SKIN: skin color, texture, turgor are normal, no rashes or significant lesions EYES: normal, conjunctiva are pink and non-injected, sclera clear OROPHARYNX:no exudate, no erythema and lips, buccal mucosa, and tongue normal  NECK: supple, thyroid normal size, non-tender, without nodularity LYMPH:  no palpable lymphadenopathy in the cervical, axillary or inguinal LUNGS:  clear to auscultation and percussion with normal breathing effort HEART: regular rate & rhythm and no murmurs and no lower extremity edema ABDOMEN:abdomen soft, non-tender and normal bowel sounds Musculoskeletal:no cyanosis of digits and no clubbing  PSYCH: alert & oriented x 3 with fluent speech NEURO: no focal motor/sensory deficits BREAST: Status post left breast lumpectomy and axillary lymph node dissection, surgical scars are healing very well, without significant discharge or skin erythema.  (+) draining tube at incision site. (+) numbness at incision site  LABORATORY DATA:  I have reviewed the data as listed CBC Latest Ref Rng & Units 10/15/2017 10/12/2017 09/29/2017  WBC 4.0 - 10.5 K/uL 10.7(H) 4.3 2.8(L)  Hemoglobin 12.0 - 15.0 g/dL 10.3(L) 11.2(L) 10.2(L)  Hematocrit 36.0 - 46.0 % 31.3(L) 34.0(L) 29.4(L)  Platelets 150 - 400 K/uL 226 225 270    CMP Latest Ref Rng & Units 10/15/2017 10/12/2017 09/29/2017  Glucose 70 - 99 mg/dL 150(H) 81 95  BUN 6 - 20 mg/dL 7 11 9   Creatinine 0.44 - 1.00 mg/dL 0.60 0.60 0.67  Sodium 135 - 145 mmol/L 139 139 140  Potassium 3.5 - 5.1 mmol/L 3.9 4.2 3.9  Chloride 98 - 111 mmol/L 107 107 108  CO2 22 - 32 mmol/L 24 24 26   Calcium 8.9 - 10.3 mg/dL 9.3 9.6 9.3  Total Protein 6.4 - 8.3 g/dL - - 6.4  Total Bilirubin 0.2 - 1.2 mg/dL - - 0.4  Alkaline Phos 40 - 150 U/L - - 65  AST 5 - 34 U/L - - 28  ALT 0 - 55 U/L - - 30     PATHOLOGY Diagnosis 10/14/2017 1. Breast, lumpectomy, Left Upper Outer Quadrant - COMPLETE THERAPEUTIC RESPONSE - NO RESIDUAL CARCINOMA. - BIOPSY SITE AND THERAPY- RELATED CHANGES. - SEE ONCOLOGY TABLE. 2. Breast, lumpectomy, Left Lower Outer Quadrant w/seed - COMPLETE THERAPEUTIC RESPONSE - NO RESIDUAL CARCINOMA. - BIOPSY SITE AND THERAPY- RELATED CHANGES. - SEE ONCOLOGY TABLE. 3. Lymph nodes, regional resection, Left Axillary - SEVENTEEN LYMPH NODES, NEGATIVE FOR CARCINOMA (0/17). Microscopic Comment 1. BREAST, STATUS POST  NEOADJUVANT TREATMENT Procedure: Lumpectomy Laterality: Left Tumor Size: No residual carcinoma Histologic Type: Invasive ductal carcinoma (based on patient's previous biopsy) Grade: Not applicable Ductal Carcinoma in Situ (DCIS): Not identified Regional Lymph Nodes: Number of Lymph Nodes Examined: 17 Number of Sentinel Lymph Nodes Examined: 0 Lymph Nodes with Macrometastases: 0 Lymph Nodes with Micrometastases: 0 Lymph Nodes with Isolated Tumor Cells: 0 Margins: Not applicable - no residual carcinoma Extent of Tumor: Not applicable Breast Prognostic Profile (pre-neoadjuvant case #: [id number]) Estrogen Receptor: 0%, negative Progesterone Receptor: 0%, negative Her2: Negative Ki-67: 80% 1 of 3 FINAL for Miller, Carrie ELAND ([id number]) Microscopic Comment(continued) Will not be performed on the current case as there is no residual carcinoma. Residual Cancer Burden (RCB): Not applicable - no residual carcinoma Treatment effect: No residual carcinoma. No lymph node metastasis. Fibrous scarring, possibly related to prior lymph node metastasis with pathologic complete response. Pathologic Stage Classification (p TNM, AJCC 8th Edition): Primary Tumor (ypT): ypT0 Regional Lymph Nodes (ypN): ypN0 (NK:ecj 10/18/2017) Specimen Gross and Clinical Information Specimen(s) Obtained: 1. Breast, lumpectomy, Left Upper Outer Quadrant 2. Breast, lumpectomy, Left Lower Outer Quadrant w/seed 3. Lymph nodes, regional resection, Left Axillary Specimen Clinical Information 1. Left breast cancer (nt) Gross 1. Specimen type: Left breast lumpectomy, upper outer quadrant, in formalin at 1408 hours.  Size: 4.7 x 4.3 cm, and ranges from 1.1 to 2 cm thick. Orientation: Green anterior, blue inferior, orange lateral, yellow medial, black posterior, red superior. Localized area: There are two inserted pins, one green and one yellow. The green localizes a radioactive seed, and the yellow a clip. Cut  surface: Found at the green pin is a radioactive seed which is sent to nuclear medicine. Found at the yellow pin is a 1.2 x 1.1 x 0.8 cm area of yellow-red, indurated and vaguely nodular tissue, which contains a clip. A discrete mass is not identified. Margins: The tissue containing the clip abuts portions of posterior margin, is 0.4 cm from superior margin, and is 1 cm or greater from remaining margins. Prognostic indicators: Not applicable. Block summary: A - D = tissue at and around clip, including nearest posterior, superior and lateral margins. E = anterior margin nearest tissue with clip. F = inferior margin nearest tissue with clip. G = medial margin nearest tissue with clip. Total, seven blocks. 2. Specimen type: Left breast lumpectomy, lower outer quadrant, in formalin at 1439 hours. Size: 6.5 x 4 cm and ranges from 1.5 to 2.2 cm thick. Orientation: Green anterior, blue inferior, orange lateral, yellow medial, black posterior, red superior. Localized area: there are two inserted pins, one green and one yellow. The green localizes a radioactive seed, and the yellow a clip. Cut surface: Found at the green pin is a radioactive seed which is sent to nuclear medicine. Found at the yellow pin is a 2.4 x 2 x 1.3 cm area of tan-yellow to dark red indurated and vaguely nodular tissue, which contains a clip. A discrete mass is not identified. Margins: The tissue containing the clip abuts portions of anterior, inferior, and posterior margins. Prognostic indicators: Not applicable. Block summary: A - E = tissue containing clip, and nearest anterior margin. F, G = tissue containing clip and nearest posterior margin. H, I = tissue containing clip and nearest inferior margin. J = superior margin nearest tissue with clip. K = medial margin nearest tissue with clip. L = lateral margin nearest tissue with clip. total, twelve blocks. 3. Received fresh is a 10.2 x 5.2 x 3 cm portion of fat within  which are found seventeen possible lymph nodes ranging from 0.2 to 1.9 cm, which are entirely submitted as follows: A = four nodes. B = three nodes. C = two nodes bisected. D = two nodes bisected. E = one node bisected. F = one node bisected. G = one node bisected. H = one node bisected. I, J = one node. K, L = largest node. Total, twelve blocks. (SSW:ecj 10/15/2017)  Diagnosis 05/10/17 1. Cervix, biopsy, 11 o'clock - ATYPICAL SQUAMOUS METAPLASIA ASSOCIATED WITH INFLAMMATION. - SEE COMMENT. 2. Endocervix, curettage - DETACHED FRAGMENTS OF SQUAMOUS MUCOSA WITH SLIGHT ATYPIA. - BENIGN ENDOCERVICAL MUCOSA. - SEE COMMENT. Microscopic Comment 1. The sections show multiple fragments of mostly metaplastic squamous mucosa displaying prominent chronic and acute inflammation to a lesser extent. This is associated with epithelial atypia in the form of nuclear enlargement, and small nucleoli. The inflammatory process obscures the epithelium which is somewhat difficult to evaluate. A p16 stain was performed and is negative. Overall, definitive or diagnostic squamous intraepithelial lesion is not appreciated in this setting, and the changes may represent a florid reactive state. Nonetheless, clinical correlation and follow up is strongly recommended. 2. The sections show multiple detached fragments of squamous mucosa, some of which display slight atypia. This is admixed with multiple  fragments of benign endocervical mucosa. A p16 stain was performed and is negative. The overall changes are not specific or diagnostic of a squamous intraepithelial lesion. Clinical correlation and follow up is recommended. (BNS:ah 05/12/17)  GYNECOLOGIC CYTOLOGY REPORT Adequacy Reason 05/10/17 Satisfactory for evaluation, endocervical/transformation zone component PRESENT. Diagnosis NEGATIVE FOR INTRAEPITHELIAL LESIONS OR MALIGNANCY. BENIGN REACTIVE/REPARATIVE CHANGES.  Diagnosis 04/19/17 1. Breast, left, needle  core biopsy, 4:00 o'clock, ribbon clip - INVASIVE DUCTAL CARCINOMA. - SEE COMMENT. 2. Breast, left, needle core biopsy, 2:00 o'clock, coil clip - INVASIVE DUCTAL CARCINOMA. - SEE COMMENT. 3. Lymph node, needle/core biopsy, left axilla, spiral hydromark - DUCTAL CARCINOMA. - SEE COMMENT. Microscopic Comment 1. The carcinoma in the three specimens is morphologically similar and is grade III. Lymph nodal tissue is not definitively identified in specimen #3. A breast prognostic profile will be performed on part 2 and the results reported separately. 2. FLUORESCENCE IN-SITU HYBRIDIZATION Results: HER2 - NEGATIVE RATIO OF HER2/CEP17 SIGNALS 1.53 AVERAGE HER2 COPY NUMBER PER CELL 2.75 2. PROGNOSTIC INDICATORS Results: IMMUNOHISTOCHEMICAL AND MORPHOMETRIC ANALYSIS PERFORMED MANUALLY Estrogen Receptor: 0%, NEGATIVE Progesterone Receptor: 0%, NEGATIVE Proliferation Marker Ki67: 80% COMMENT: The negative hormone receptor study(ies) in this case has An internal positive control.    RADIOGRAPHIC STUDIES:  10/06/2017 MR Abdomen W WO Contrast IMPRESSION: 1. No evidence of metastatic breast cancer. 2. The hepatic lesion of concern on prior CT is a simple cyst. There is an additional probable simple cyst as well as a small hemangioma in the left hepatic lobe. 3. Hepatic steatosis and L2 osseous hemangioma also noted.   I have personally reviewed the radiological images as listed and agreed with the findings in the report. Mr Abdomen W Wo Contrast  Result Date: 10/06/2017 CLINICAL DATA:  Left breast cancer diagnosed in December 2018. Indeterminate liver lesion. EXAM: MRI ABDOMEN WITHOUT AND WITH CONTRAST TECHNIQUE: Multiplanar multisequence MR imaging of the abdomen was performed both before and after the administration of intravenous contrast. CONTRAST:  37m MULTIHANCE GADOBENATE DIMEGLUMINE 529 MG/ML IV SOLN COMPARISON:  CT 05/07/2017. FINDINGS: Lower chest: The visualized lower chest  appears unremarkable. Previously noted left breast mass has been resected. Hepatobiliary: The liver demonstrates mild background steatosis with loss of signal on the gradient echo opposed phase images. The lesion of concern posteriorly in the right hepatic lobe (segment 7) is a simple cyst measuring 13 x 11 mm on image 14/3. In the same segment, there is a 4 mm cystic appearing lesion on image 12/3. 8 x 12 mm lesion posteriorly in the left hepatic lobe (segment 2, image 15/3) demonstrates T2 hyperintensity and homogeneous enhancement following contrast, consistent with a flash filling hemangioma. No suspicious hepatic findings. No evidence of gallstones, gallbladder wall thickening or biliary dilatation. Pancreas: Unremarkable. No pancreatic ductal dilatation or surrounding inflammatory changes. Spleen: Normal in size without focal abnormality. Adrenals/Urinary Tract: Both adrenal glands appear normal. Both kidneys appear normal without evidence of renal mass or hydronephrosis. Stomach/Bowel: No evidence of bowel wall thickening, distention or surrounding inflammatory change. Vascular/Lymphatic: There are no enlarged abdominal lymph nodes. No significant vascular findings. Other: No ascites or peritoneal nodularity. Musculoskeletal: 1.6 cm T2 hyperintense and enhancing lesion in the right aspect of the L2 vertebral body (image 43/3 and 29/6) is not well seen on the T1 weighted images, and has an appearance most consistent with a hemangioma. This corresponds with the CT appearance. No suspicious hepatic findings. IMPRESSION: 1. No evidence of metastatic breast cancer. 2. The hepatic lesion of concern on prior CT is  a simple cyst. There is an additional probable simple cyst as well as a small hemangioma in the left hepatic lobe. 3. Hepatic steatosis and L2 osseous hemangioma also noted. Electronically Signed   By: Richardean Sale M.D.   On: 10/06/2017 10:35   Mm Breast Surgical Specimen  Result Date:  10/14/2017 CLINICAL DATA:  Status post LEFT breast lumpectomy today after earlier radioactive seed localization. EXAM: SPECIMEN RADIOGRAPH OF THE LEFT BREAST COMPARISON:  Previous exam(s). FINDINGS: Status post excision of the LEFT breast. The radioactive seed and ribbon shaped biopsy marker clip are present, completely intact, and were marked for pathology. The positions of the radioactive seed and ribbon shaped biopsy marker clip within the specimen were discussed with the OR staff during the procedure. IMPRESSION: Specimen radiograph of the left breast. Electronically Signed   By: Franki Cabot M.D.   On: 10/14/2017 14:14   Mm Breast Surgical Specimen  Result Date: 10/14/2017 CLINICAL DATA:  Left double lumpectomy for 2 areas of biopsy-proven invasive ductal carcinoma treated with neoadjuvant chemotherapy. EXAM: SPECIMEN RADIOGRAPH OF THE LEFT BREAST COMPARISON:  Previous exam(s). FINDINGS: Status post excision of the left breast. The radioactive seed and coil shaped biopsy marker clip are present, completely intact, and were marked for pathology. IMPRESSION: Specimen radiograph of the left breast. Electronically Signed   By: Claudie Revering M.D.   On: 10/14/2017 13:49   Mm Lt Radioactive Seed Loc Mammo Guide  Result Date: 10/13/2017 CLINICAL DATA:  22 year old female for radioactive seed localizations of UPPER-OUTER LEFT breast cancer and LOWER OUTER LEFT breast cancer prior to lumpectomies. EXAM: MAMMOGRAPHIC GUIDED RADIOACTIVE SEED LOCALIZATION OF THE LEFT BREAST X 2 COMPARISON:  Previous exam(s). FINDINGS: Patient presents for radioactive seed localizations prior to 2 LEFT lumpectomies. I met with the patient and we discussed the procedure of seed localization including benefits and alternatives. We discussed the high likelihood of successful procedures. We discussed the risks of the procedure including infection, bleeding, tissue injury and further surgery. We discussed the low dose of radioactivity  involved in the procedures. Informed, written consent was given. The usual time-out protocol was performed immediately prior to the procedures. UPPER-OUTER LEFT BREAST CANCER SEED LOCALIZATION: Using mammographic guidance, sterile technique, 1% lidocaine and an I-125 radioactive seed, the COIL shaped clip within the UPPER-OUTER LEFT breast was localized using a LATERAL approach. The follow-up mammogram images confirm the seed in the expected location and were marked for Dr. Barry Dienes. Follow-up survey of the patient confirms presence of the radioactive seed. Order number of I-125 seed:  [id number]. Total activity:  5.009 millicuries.  Reference Date: 09/24/2017 LOWER OUTER LEFT BREAST CANCER SEED LOCALIZATION: Using mammographic guidance, sterile technique, 1% lidocaine and an I-125 radioactive seed, the RIBBON shaped clip within the LOWER OUTER LEFT breast was localized using a LATERAL approach. The follow-up mammogram images confirm the seed in the expected location and were marked for Dr. Barry Dienes. Follow-up survey of the patient confirms presence of the radioactive seed. Order number of I-125 seed:  [id number]. Total activity:  1.696 millicuries.  Reference Date: 10/08/2017 The patient tolerated the procedures well and was released from the [facility]. She was given instructions regarding seed removal. IMPRESSION: Two separate radioactive seed localizations of the LEFT breast. No apparent complications. Electronically Signed   By: Margarette Canada M.D.   On: 10/13/2017 15:18   Mm Lt Rad Seed Ea Add Lesion Loc Mammo  Result Date: 10/13/2017 CLINICAL DATA:  22 year old female for radioactive seed localizations of UPPER-OUTER LEFT breast  cancer and LOWER OUTER LEFT breast cancer prior to lumpectomies. EXAM: MAMMOGRAPHIC GUIDED RADIOACTIVE SEED LOCALIZATION OF THE LEFT BREAST X 2 COMPARISON:  Previous exam(s). FINDINGS: Patient presents for radioactive seed localizations prior to 2 LEFT lumpectomies. I met with the  patient and we discussed the procedure of seed localization including benefits and alternatives. We discussed the high likelihood of successful procedures. We discussed the risks of the procedure including infection, bleeding, tissue injury and further surgery. We discussed the low dose of radioactivity involved in the procedures. Informed, written consent was given. The usual time-out protocol was performed immediately prior to the procedures. UPPER-OUTER LEFT BREAST CANCER SEED LOCALIZATION: Using mammographic guidance, sterile technique, 1% lidocaine and an I-125 radioactive seed, the COIL shaped clip within the UPPER-OUTER LEFT breast was localized using a LATERAL approach. The follow-up mammogram images confirm the seed in the expected location and were marked for Dr. Barry Dienes. Follow-up survey of the patient confirms presence of the radioactive seed. Order number of I-125 seed:  [id number]. Total activity:  6.237 millicuries.  Reference Date: 09/24/2017 LOWER OUTER LEFT BREAST CANCER SEED LOCALIZATION: Using mammographic guidance, sterile technique, 1% lidocaine and an I-125 radioactive seed, the RIBBON shaped clip within the LOWER OUTER LEFT breast was localized using a LATERAL approach. The follow-up mammogram images confirm the seed in the expected location and were marked for Dr. Barry Dienes. Follow-up survey of the patient confirms presence of the radioactive seed. Order number of I-125 seed:  [id number]. Total activity:  1.607 millicuries.  Reference Date: 10/08/2017 The patient tolerated the procedures well and was released from the [facility]. She was given instructions regarding seed removal. IMPRESSION: Two separate radioactive seed localizations of the LEFT breast. No apparent complications. Electronically Signed   By: Margarette Canada M.D.   On: 10/13/2017 15:18    ASSESSMENT & PLAN:  Carrie Miller a 22 y.o. 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  -She presented with a palpable left breast mass, measures 5.2 cm on ultrasound, with positive lymph node. Breast tumor biopsy showed triple negative breast ductal carcinoma. -Her staging CT and bone scan was negative for distant metastasis, except a small lesion in liver likely a cyst  -She was seen by Dr. Barry Dienes on 1/7 who feels she is potentially a candidate for breast conserving surgery but more likely will need at least left mastectomy if not bilateral mastectomies due to her young age, tumor size, and risk of recurrence.   -Genetic testing was negative for pathogenic mutation. 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 She received monthly Zoladex injections for the duration of chemotherapy to suppress ovarian function -She completed neoadjuvant chemo with ACX4 followed by weekly Norma Fredrickson and Taxol for 11 weeks (last cycle cancelled due to neuropathy) -She underwent double lumpectomy and left axillary lymph node dissection by Dr. Barry Dienes on October 14, 2017, I reviewed her pathology results, she has had complete pathological response, no residual malignant cells in breast and lymph nodes -She does not need any adjuvant chemotherapy -He is scheduled to see radiation oncologist Dr. Isidore Moos in a few weeks, and  will undergo adjuvant breast radiation -We discussed breast cancer surveillance.  Her complete pathological response is a very good prognostic factor, however she still at moderate risk for recurrence given her triple negative disease and initial locally  advanced to stage.  I recommend close follow-up every 3 to 4 months for the first 2 to 3 years, then every 6 months for a total of 5 years. -She will continue annual diagnostic mammogram and breast MRI for breast cancer screening -Encouraged her to eat healthy and exercise, and try to loose some weight  -Port was removed during her breast surgery.  9. Peripheral neuropathy, G2, secondary to chemotherapy  -She developed mild intermittent numbness to fingertips after cycle 3 AC. Function is not limited.  No sensory deficits.   -Her neuropathy has progressed on Taxol/Abraxane, she also developed pain on her lower extremities.   -She was on Tylenol 3 and Cymbalta -Neuropathy has much improved overall after she completed chemo, she is off Tylenol 3  3.  Headaches, dizziness   -When she was diagnosed with breast cancer, she has 1 month history of daily headaches with associated dizziness, pain ranges from 5/10 - 9/10 on the crown of her head, ibuprofen helps some; she can continue this or take tylenol PRN, neuro exam was unremarkable -Given the aggressive nature of triple negative disease, I obtained brain MRI on 05/05/17 which showed no evidence of brain mets.  -resolved now after she completed chemo    4.  Anxiety  -She has considerable anxiety about her new diagnosis and treatment plan  -I previously prescribed low dose xanax for her to take PRN for anxiety especially at night if she has difficulty sleeping -Stable   5. Genetics  -Due to her young age and family history of prostate cancer, she has been referred to genetics -05/11/17 Genetic testing was negative for pathogenic mutation  6. Small liver lesion -Her initial staging CT scan showed a 1 cm hypoattenuated lesion in the posterior dome of the liver, likely a complicated liver cyst, hemangioma, although metastatic disease is not ruled out. -We did a abdominal MRI with and without contrast before her breast surgery which showed a simple  cyst and hepatic steatosis  PLAN:   -She will follow-up with Dr. Barry Dienes with drainage tube removal  -She will see Dr. Isidore Moos on 11/09/17 and likely undergo adjuvant breast radiation - lab and follow-up in 3 months   No orders of the defined types were placed in this encounter.     All questions were answered. The patient knows to call the clinic with any problems, questions or concerns. I spent 20 minutes counseling the patient face to face. The total time spent in the appointment was 25 minutes and more than 50% was on counseling.   Dierdre Searles Dweik am acting as scribe for Dr. [name].  I have reviewed the above documentation for accuracy and completeness, and I agree with the above.    [name], MD 10/25/2017

## 2017-10-25 ENCOUNTER — Encounter: Payer: Self-pay | Admitting: Hematology

## 2017-10-25 ENCOUNTER — Telehealth: Payer: Self-pay | Admitting: Hematology

## 2017-10-25 ENCOUNTER — Inpatient Hospital Stay: Payer: Medicaid Other | Attending: Nurse Practitioner | Admitting: Hematology

## 2017-10-25 VITALS — BP 111/85 | HR 74 | Temp 98.1°F | Resp 17 | Ht 64.0 in | Wt 206.5 lb

## 2017-10-25 DIAGNOSIS — K769 Liver disease, unspecified: Secondary | ICD-10-CM | POA: Diagnosis not present

## 2017-10-25 DIAGNOSIS — C50812 Malignant neoplasm of overlapping sites of left female breast: Secondary | ICD-10-CM | POA: Insufficient documentation

## 2017-10-25 DIAGNOSIS — F418 Other specified anxiety disorders: Secondary | ICD-10-CM | POA: Insufficient documentation

## 2017-10-25 DIAGNOSIS — G620 Drug-induced polyneuropathy: Secondary | ICD-10-CM | POA: Diagnosis not present

## 2017-10-25 DIAGNOSIS — T451X5A Adverse effect of antineoplastic and immunosuppressive drugs, initial encounter: Secondary | ICD-10-CM | POA: Insufficient documentation

## 2017-10-25 NOTE — Telephone Encounter (Signed)
Appointments scheduled LMVM for patient and mailed calendar/ letter per 7/8 los

## 2017-10-29 ENCOUNTER — Emergency Department (HOSPITAL_COMMUNITY)
Admission: EM | Admit: 2017-10-29 | Discharge: 2017-10-30 | Disposition: A | Discharge status: Home / Self Care | Payer: Medicaid Other | Attending: Emergency Medicine | Admitting: Emergency Medicine

## 2017-10-29 ENCOUNTER — Encounter (HOSPITAL_COMMUNITY): Payer: Self-pay | Admitting: *Deleted

## 2017-10-29 DIAGNOSIS — Z9221 Personal history of antineoplastic chemotherapy: Secondary | ICD-10-CM | POA: Insufficient documentation

## 2017-10-29 DIAGNOSIS — Z79899 Other long term (current) drug therapy: Secondary | ICD-10-CM | POA: Insufficient documentation

## 2017-10-29 DIAGNOSIS — Z95820 Peripheral vascular angioplasty status with implants and grafts: Secondary | ICD-10-CM | POA: Insufficient documentation

## 2017-10-29 DIAGNOSIS — Z5189 Encounter for other specified aftercare: Secondary | ICD-10-CM | POA: Insufficient documentation

## 2017-10-29 DIAGNOSIS — N644 Mastodynia: Secondary | ICD-10-CM | POA: Diagnosis present

## 2017-10-29 DIAGNOSIS — Z853 Personal history of malignant neoplasm of breast: Secondary | ICD-10-CM | POA: Diagnosis not present

## 2017-10-29 LAB — CBC WITH DIFFERENTIAL/PLATELET
BASOS PCT: 0 %
Basophils Absolute: 0 10*3/uL (ref 0.0–0.1)
EOS PCT: 4 %
Eosinophils Absolute: 0.2 10*3/uL (ref 0.0–0.7)
HEMATOCRIT: 31.4 % — AB (ref 36.0–46.0)
Hemoglobin: 10.7 g/dL — ABNORMAL LOW (ref 12.0–15.0)
LYMPHS PCT: 31 %
Lymphs Abs: 1.4 10*3/uL (ref 0.7–4.0)
MCH: 32.8 pg (ref 26.0–34.0)
MCHC: 34.1 g/dL (ref 30.0–36.0)
MCV: 96.3 fL (ref 78.0–100.0)
MONO ABS: 0.2 10*3/uL (ref 0.1–1.0)
MONOS PCT: 5 %
NEUTROS ABS: 2.8 10*3/uL (ref 1.7–7.7)
Neutrophils Relative %: 60 %
Platelets: 328 10*3/uL (ref 150–400)
RBC: 3.26 MIL/uL — ABNORMAL LOW (ref 3.87–5.11)
RDW: 13.9 % (ref 11.5–15.5)
WBC: 4.5 10*3/uL (ref 4.0–10.5)

## 2017-10-29 LAB — BASIC METABOLIC PANEL
ANION GAP: 9 (ref 5–15)
BUN: 11 mg/dL (ref 6–20)
CALCIUM: 9.3 mg/dL (ref 8.9–10.3)
CO2: 28 mmol/L (ref 22–32)
Chloride: 107 mmol/L (ref 98–111)
Creatinine, Ser: 0.66 mg/dL (ref 0.44–1.00)
GFR calc Af Amer: >60 mL/min (ref >60)
GFR calc non Af Amer: >60 mL/min (ref >60)
GLUCOSE: 104 mg/dL — AB (ref 70–99)
Potassium: 3.5 mmol/L (ref 3.5–5.1)
Sodium: 144 mmol/L (ref 135–145)

## 2017-10-29 MED ORDER — FENTANYL CITRATE (PF) 100 MCG/2ML IJ SOLN
50.0000 ug | Freq: Once | INTRAMUSCULAR | Status: DC
  Filled 2017-10-29: qty 2

## 2017-10-29 MED ORDER — FENTANYL CITRATE (PF) 100 MCG/2ML IJ SOLN: 100.0000 ug | Freq: Once | INTRAMUSCULAR | Status: DC

## 2017-10-29 MED ORDER — OXYCODONE-ACETAMINOPHEN 5-325 MG PO TABS
2.0000 | ORAL_TABLET | Freq: Once | ORAL | Status: AC
  Administered 2017-10-30: 2 via ORAL
  Filled 2017-10-29: qty 2

## 2017-10-29 NOTE — ED Notes (Signed)
Wasted fentanyl with Nilsa Nutting, RN. Pain medication switched to oral.

## 2017-10-29 NOTE — ED Provider Notes (Signed)
[facility] DEPT Provider Note   CSN: [id number] Arrival date & time: 10/29/17  2000     History   Chief Complaint Chief Complaint  Patient presents with  . Wound Check    cancer pt    HPI Carrie Miller is a 22 y.o. female past medical history of breast cancer who is status post lumpectomy on 10/14/17 presents for evaluation of pain to the left breast, drainage in her JP drain.  Patient reports that she was diagnosed with breast cancer and had a lumpectomy done 3 weeks ago (627/19).  She had a drain inserted at that time. She reports that the drain has been draining clear or white fluid.  She reports over the last 3 to 4 days, she has noticed blood in the drain.  Additionally, she has had worsening pain to the area.  She has not noticed any redness or swelling.  She states that she had been taking Tylenol for pain.  Patient states that they have given her some Norco which she had been intermittently taking but states that her pain continued to increase, prompting ED visit.  She states that she is scheduled to follow-up with surgery on 11/08/17.  Patient denies any fevers, chest pain, difficulty breathing, nausea/vomiting.  The history is provided by the patient and a relative. The history is limited by a language barrier.    Past Medical History:  Diagnosis Date  . Breast cancer ([place]) left  . Family history of cancer   . Genetic testing 04/29/2017   Common Cancers panel (47 genes) @ [facility] - No pathoagenic mutations detected    Patient Active Problem List   Diagnosis Date Noted  . Breast cancer metastasized to axillary lymph node, left ([place]) 10/14/2017  . Anemia due to antineoplastic chemotherapy 09/01/2017  . 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 Port-A-Cath in place 05/12/2017  . Genetic testing 04/29/2017  . Family history of cancer   . Cancer of overlapping sites of left breast ([place]) 04/28/2017    Past Surgical  History:  Procedure Laterality Date  . BREAST LUMPECTOMY WITH RADIOACTIVE SEED AND AXILLARY LYMPH NODE DISSECTION Left 10/14/2017   Procedure: LEFT BREAST LUMPECTOMY X 2 WITH RADIOACTIVE SEED X  2 AND LEFT AXILLARY LYMPH NODE DISSECTION;  Surgeon: [name], MD;  Location: [facility];  Service: General;  Laterality: Left;  . IR FLUORO GUIDE PORT INSERTION RIGHT  05/03/2017  . IR US GUIDE VASC ACCESS RIGHT  05/03/2017  . PORT-A-CATH REMOVAL N/A 10/14/2017   Procedure: REMOVAL PORT-A-CATH;  Surgeon: [name], MD;  Location: [facility];  Service: General;  Laterality: N/A;     OB History    Gravida  0   Para  0   Term  0   Preterm  0   AB  0   Living  0     SAB  0   TAB  0   Ectopic  0   Multiple  0   Live Births  0            Home Medications    Prior to Admission medications   Medication Sig Start Date End Date Taking? Authorizing Provider  acetaminophen (TYLENOL) 325 MG tablet Take 2 tablets (650 mg total) by mouth every 4 (four) hours as needed. 10/15/17 10/15/18 Yes [name], MD  amoxicillin (AMOXIL) 500 MG tablet Take 500 mg by mouth 2 (two) times daily.   Yes [provider]  Cyanocobalamin (VITAMIN  B-12 PO) Take 1 tablet by mouth daily.   Yes [provider]  DULoxetine (CYMBALTA) 30 MG capsule Take 1 capsule (30 mg total) by mouth daily. 09/29/17  Yes [name], MD  Multiple Vitamins-Minerals (BIOTIN PLUS/CALCIUM/VIT D3 PO) Take 1 tablet by mouth daily.   Yes [provider]  ALPRAZolam (XANAX) 0.25 MG tablet Take 1 tablet (0.25 mg total) by mouth at bedtime as needed for anxiety. Patient not taking: Reported on 10/29/2017 08/19/17   [name], NP  LORazepam (ATIVAN) 0.5 MG tablet Take 1 tablet (0.5 mg total) by mouth every 8 (eight) hours as needed (nausea, vomiting). Patient not taking: Reported on 10/29/2017 06/24/17   [name], NP  oxyCODONE (OXY IR/ROXICODONE) 5 MG immediate release tablet Take 1 tablet (5 mg total) by  mouth every 6 (six) hours as needed for severe pain. Patient not taking: Reported on 10/29/2017 10/15/17   [name], MD  oxyCODONE-acetaminophen (PERCOCET/ROXICET) 5-325 MG tablet Take 1-2 tablets by mouth every 8 (eight) hours as needed for severe pain. 10/30/17   Volanda Napoleon, PA-C    Family History Family History  Problem Relation Age of Onset  . Hyperlipidemia Mother   . Hypertension Mother   . Prostate cancer Father 13       currently 62  . Stomach cancer Paternal Aunt 4       deceased 86s  . Prostate cancer Paternal Uncle 32       deceased 4    Social History Social History   Tobacco Use  . Smoking status: Never Smoker  . Smokeless tobacco: Never Used  Substance Use Topics  . Alcohol use: No    Frequency: Never  . Drug use: No     Allergies   Heparin   Review of Systems Review of Systems  Constitutional: Negative for fever.  Respiratory: Negative for cough and shortness of breath.   Cardiovascular: Negative for chest pain.  Gastrointestinal: Negative for abdominal pain, nausea and vomiting.  Genitourinary: Negative for dysuria and hematuria.  Skin: Positive for wound. Negative for color change.  Neurological: Negative for headaches.  All other systems reviewed and are negative.    Physical Exam Updated Vital Signs BP 123/87   Pulse 96   Temp 98.1 F (36.7 C) (Oral)   Resp 18   SpO2 100%   Physical Exam  Constitutional: She appears well-developed and well-nourished.  HENT:  Head: Normocephalic and atraumatic.  Eyes: Conjunctivae and EOM are normal. Right eye exhibits no discharge. Left eye exhibits no discharge. No scleral icterus.  Pulmonary/Chest: Effort normal.  The exam was performed with a chaperone present.  Tenderness to palpation of the left breast.  No mass noted.  She has 2 surgical incision sites that appeared to be well-healing.  No surrounding warmth, erythema, induration, fluctuance. No palpable mass. No purulent drainage.     Neurological: She is alert.  Skin: Skin is warm and dry.  Psychiatric: She has a normal mood and affect. Her speech is normal and behavior is normal.  Nursing note and vitals reviewed.    ED Treatments / Results  Labs (all labs ordered are listed, but only abnormal results are displayed) Labs Reviewed  BASIC METABOLIC PANEL - Abnormal; Notable for the following components:      Result Value   Glucose, Bld 104 (*)    All other components within normal limits  CBC WITH DIFFERENTIAL/PLATELET - Abnormal; Notable for the following components:   RBC 3.26 (*)  Hemoglobin 10.7 (*)    HCT 31.4 (*)    All other components within normal limits    EKG None  Radiology No results found.  Procedures Procedures (including critical care time)  Medications Ordered in ED Medications  fentaNYL (SUBLIMAZE) injection 100 mcg (100 mcg Intramuscular Refused 10/30/17 0031)  oxyCODONE-acetaminophen (PERCOCET/ROXICET) 5-325 MG per tablet 2 tablet (2 tablets Oral Given 10/30/17 0032)     Initial Impression / Assessment and Plan / ED Course  I have reviewed the triage vital signs and the nursing notes.  Pertinent labs & imaging results that were available during my care of the patient were reviewed by me and considered in my medical decision making (see chart for details).     22 y.o. F who presents for evaluation of wound check to left breast.  Recently diagnosed with breast cancer and had a lumpectomy done on 10/14/17 by [place] surgery.  Had a drain inserted at the time.  Reports worsening pain in the area over the last 3 to 4 days.  Additionally, she states that she started having bloody discharge from the drain.  Reports to have been improving to white or clear.  No fevers, nausea/vomiting. Patient is afebrile, non-toxic appearing, sitting comfortably on examination table. Vital signs reviewed and stable.  On exam, she has tenderness palpation of the left breast.  No evidence of warmth or  erythema noted to the and surgical incision sites over the drainage. No fluctuance noted.  The drain is draining some serosanginous light pink fluid.  No palpable mass, fluctuance. No evidence of abscess.  Consider normal healing and pain from surgical operation.  History/physical exam not concerning for PE. ` Plan to check basic labs.  CBC without any significant leukocytosis.  Hemoglobin is 10.7 hematocrit is 31.4.  BMP is unremarkable.  At this time, given lack of fever, lack of leukocytosis, lack of infectious signs on exam, do not expect infectious etiology to be the source of patient's symptoms.  Surrounding drain site has no evidence of warmth, erythema, fluctuance, induration that would be concerning for cellulitis or evidence of abscess.  Initially, her incision sites are well-healing. Discussed patient with Dr. Johnney Killian who independently evaluated patient.  At this time, given lack of infectious symptoms, no indication for further imaging at this time.  We will plan to control pain better with additional pain medications.  Patient has not scheduled follow-up with surgery until 11/08/16.  Instructed them to call tomorrow to arrange for an appointment for evaluation sooner. Patient had ample opportunity for questions and discussion. All patient's questions were answered with full understanding. Strict return precautions discussed. Patient expresses understanding and agreement to plan.    Final Clinical Impressions(s) / ED Diagnoses   Final diagnoses:  Visit for wound check  Breast pain    ED Discharge Orders        Ordered    oxyCODONE-acetaminophen (PERCOCET/ROXICET) 5-325 MG tablet  Every 8 hours PRN     10/30/17 0022       Volanda Napoleon, PA-C 10/30/17 0041    Charlesetta Shanks, MD 10/31/17 0028

## 2017-10-29 NOTE — ED Triage Notes (Signed)
Pt reports having a left breast lumpectomy done approx 2 weeks ago, about 4 days ago she developed swelling in entire left breast, and lots of pain.

## 2017-10-29 NOTE — ED Notes (Signed)
Patient requested oral pain medication to avoid IV. Was unable to begin IV when blood work was obtained.

## 2017-10-30 MED ORDER — OXYCODONE-ACETAMINOPHEN 5-325 MG PO TABS: 1.0000 | ORAL_TABLET | Freq: Three times a day (TID) | ORAL | 0 refills | Status: AC | PRN

## 2017-10-30 NOTE — Discharge Instructions (Addendum)
As we discussed today, the area does not appear to be infected.   You can take Tylenol or Ibuprofen as directed for pain. You can alternate Tylenol and Ibuprofen every 4 hours. If you take Tylenol at 1pm, then you can take Ibuprofen at 5pm. Then you can take Tylenol again at 9pm.   Take the pain medication as directed.   Please call your surgeon tomorrow to see if you can be seen earlier.   Return to emergency department for any fever, worsening pain, redness or swelling around the site or in the surgical incisions, or any other worsening or concerning symptoms.

## 2017-11-04 NOTE — Progress Notes (Signed)
Location of Breast Cancer: Left Breast  Histology per Pathology Report:  04/19/17 Diagnosis 1. Breast, left, needle core biopsy, 4:00 o'clock, ribbon clip - INVASIVE DUCTAL CARCINOMA. - SEE COMMENT. 2. Breast, left, needle core biopsy, 2:00 o'clock, coil clip - INVASIVE DUCTAL CARCINOMA. - SEE COMMENT. 3. Lymph node, needle/core biopsy, left axilla, spiral hydromark - DUCTAL CARCINOMA.   Receptor Status: ER(NEG), PR (NEG), Her2-neu (NEG), Ki-(80%)  10/14/17 Diagnosis 1. Breast, lumpectomy, Left Upper Outer Quadrant - COMPLETE THERAPEUTIC RESPONSE - NO RESIDUAL CARCINOMA. - BIOPSY SITE AND THERAPY- RELATED CHANGES. - SEE ONCOLOGY TABLE. 2. Breast, lumpectomy, Left Lower Outer Quadrant w/seed - COMPLETE THERAPEUTIC RESPONSE - NO RESIDUAL CARCINOMA. - BIOPSY SITE AND THERAPY- RELATED CHANGES. - SEE ONCOLOGY TABLE. 3. Lymph nodes, regional resection, Left Axillary - SEVENTEEN LYMPH NODES, NEGATIVE FOR CARCINOMA (0/17).  Did patient present with symptoms or was this found on screening mammography?: She self palpated a left breast mass late November 2018 and presented to the Emergency Room.   Past/Anticipated interventions by surgeon, if any:  10/14/17. Dr. Barry Dienes.  Left Breast Radioactive seed localized lumpectomy x 2, left axillary lymph node dissection, and right internal jugular port removal.    Follow up with surgery on 11/08/17, Her surgery drain was removed. She complains of pain to the old drainage site today.    Past/Anticipated interventions by medical oncology, if any:  10/25/17 Dr. Burr Medico:  05/12/2017 - 09/16/2017 Chemotherapy     1. Neoadjuvant AC q2 weeks x4 cycles then weekly 05/12/17 - 06/24/17 followed by carbo/taxol for 12 weeks starting 07/08/17. Due to poor toleration, changed Taxol to abraxane and added Carboplatin with cycle 2. 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  2. Monthly zoladex injections starting 04/30/17 for the duration of chemotherapy     PLAN:   -She will follow-up with Dr. Barry Dienes with drainage tube removal  -She will see Dr. Isidore Moos on 11/09/17 and likely undergo adjuvant breast radiation - lab and follow-up in 3 months   Lymphedema issues, if any:  She denies.   Pain issues, if any:  She reports pain a 4/10 to her old breast drain site (it was removed yesterday)  SAFETY ISSUES:  Prior radiation? No  Pacemaker/ICD? No  Possible current pregnancy? No, she is not having periods.   Is the patient on methotrexate? No  Current Complaints / other details:   BP 100/71 (BP Location: Right Arm, Patient Position: Sitting, Cuff Size: Normal)   Pulse (!) 109   Temp 98.2 F (36.8 C) (Oral)   Resp 16   Ht 5' 4"  (1.626 m)   Wt 204 lb 9.6 oz (92.8 kg)   SpO2 99%   BMI 35.12 kg/m    Wt Readings from Last 3 Encounters:  11/09/17 204 lb 9.6 oz (92.8 kg)  10/25/17 206 lb 8 oz (93.7 kg)  10/14/17 209 lb 12.8 oz (95.2 kg)

## 2017-11-08 MED FILL — oxyCODONE HCL 5 MG TABS: 5 | 3 days supply | Qty: 20 | Fill #0

## 2017-11-09 ENCOUNTER — Encounter: Payer: Self-pay | Admitting: Radiation Oncology

## 2017-11-09 ENCOUNTER — Ambulatory Visit
Admission: RE | Admit: 2017-11-09 | Discharge: 2017-11-09 | Disposition: A | Discharge status: Home / Self Care | Payer: Medicaid Other | Source: Ambulatory Visit | Attending: Radiation Oncology | Admitting: Radiation Oncology

## 2017-11-09 ENCOUNTER — Ambulatory Visit: Payer: Medicaid Other | Attending: General Surgery | Admitting: Physical Therapy

## 2017-11-09 ENCOUNTER — Encounter: Payer: Self-pay | Admitting: Physical Therapy

## 2017-11-09 ENCOUNTER — Other Ambulatory Visit: Payer: Self-pay | Admitting: Radiation Oncology

## 2017-11-09 ENCOUNTER — Other Ambulatory Visit: Payer: Self-pay

## 2017-11-09 VITALS — BP 100/71 | HR 109 | Temp 98.2°F | Resp 16 | Ht 64.0 in | Wt 204.6 lb

## 2017-11-09 DIAGNOSIS — Z171 Estrogen receptor negative status [ER-]: Secondary | ICD-10-CM | POA: Insufficient documentation

## 2017-11-09 DIAGNOSIS — M6281 Muscle weakness (generalized): Secondary | ICD-10-CM | POA: Insufficient documentation

## 2017-11-09 DIAGNOSIS — Z888 Allergy status to other drugs, medicaments and biological substances status: Secondary | ICD-10-CM | POA: Insufficient documentation

## 2017-11-09 DIAGNOSIS — Z79899 Other long term (current) drug therapy: Secondary | ICD-10-CM | POA: Insufficient documentation

## 2017-11-09 DIAGNOSIS — M25512 Pain in left shoulder: Secondary | ICD-10-CM | POA: Diagnosis present

## 2017-11-09 DIAGNOSIS — M25612 Stiffness of left shoulder, not elsewhere classified: Secondary | ICD-10-CM | POA: Insufficient documentation

## 2017-11-09 DIAGNOSIS — R293 Abnormal posture: Secondary | ICD-10-CM | POA: Insufficient documentation

## 2017-11-09 DIAGNOSIS — C50812 Malignant neoplasm of overlapping sites of left female breast: Secondary | ICD-10-CM

## 2017-11-09 DIAGNOSIS — Z483 Aftercare following surgery for neoplasm: Secondary | ICD-10-CM | POA: Insufficient documentation

## 2017-11-09 NOTE — Progress Notes (Signed)
Radiation Oncology         [phone] ________________________________  Name: Carrie Miller MRN: [id number]  Date: 11/09/2017  DOB: 11-26-1995  Follow-Up Visit Note  Outpatient  CC: Patient, No Pcp Per  [name], MD  Diagnosis:      ICD-10-CM   1. Cancer of overlapping sites of left breast [facility]) C50.812      Cancer of overlapping sites of left breast ([place]). Clinical staging from 04/19/18: Stage IIIC (cT3(m), cN1, cM0), ME:BRAXENMM, PR: negative, Her2: negative. Grade 3   (Cancer stage was possibly T4d at diagnosis)  CHIEF COMPLAINT: Here to discuss management of left breast cancer  Narrative:  The patient returns today for follow-up.    On 10/14/2017 she underwent left breast lumpectomies and axillary dissection. Left UOQ and Left LOQ with seed showed complete therapeutic response - no residual carcinoma. Biopsy site and therapy - related changes. All 17 lymph nodes are negative for carcinoma.   Prior testing: Estrogen Receptor: 0%, negative; Progesterone Receptor: 0%, negative; Her2: Negative; Ki-67: 80%  She is going to have PT for ROM in arm.  Sore from surgery.  Drain removed yesterday. Denies arm edema        ALLERGIES:  is allergic to heparin.  Meds: Current Outpatient Medications  Medication Sig Dispense Refill  . acetaminophen (TYLENOL) 325 MG tablet Take 2 tablets (650 mg total) by mouth every 4 (four) hours as needed. 50 tablet 2  . Cyanocobalamin (VITAMIN B-12 PO) Take 1 tablet by mouth daily.    . DULoxetine (CYMBALTA) 30 MG capsule Take 1 capsule (30 mg total) by mouth daily. 30 capsule 0  . Multiple Vitamins-Minerals (BIOTIN PLUS/CALCIUM/VIT D3 PO) Take 1 tablet by mouth daily.    Marland Kitchen oxyCODONE (OXY IR/ROXICODONE) 5 MG immediate release tablet Take 1 tablet (5 mg total) by mouth every 6 (six) hours as needed for severe pain. 10 tablet 0  . ALPRAZolam (XANAX) 0.25 MG tablet Take 1 tablet (0.25 mg total) by mouth at bedtime as needed for anxiety. (Patient  not taking: Reported on 10/29/2017) 30 tablet 0  . amoxicillin (AMOXIL) 500 MG tablet Take 500 mg by mouth 2 (two) times daily.    Marland Kitchen LORazepam (ATIVAN) 0.5 MG tablet Take 1 tablet (0.5 mg total) by mouth every 8 (eight) hours as needed (nausea, vomiting). (Patient not taking: Reported on 10/29/2017) 20 tablet 0  . oxyCODONE-acetaminophen (PERCOCET/ROXICET) 5-325 MG tablet Take 1-2 tablets by mouth every 8 (eight) hours as needed for severe pain. (Patient not taking: Reported on 11/09/2017) 8 tablet 0   No current facility-administered medications for this encounter.     Physical Findings:  height is _0  (1.626 m) and weight is 204 lb 9.6 oz (92.8 kg). Her oral temperature is 98.2 F (36.8 C). Her blood pressure is 100/71 and her pulse is 109 (abnormal). Her respiration is 16 and oxygen saturation is 99%. .     General: Alert and oriented, in no acute distress Extremities: No obvious lymphadema in her arms.  Lymphatics: see Neck Exam Musculoskeletal: symmetric strength and muscle tone throughout. She is unable to abduct her left arm past 60 degrees.  Neurologic: No obvious focalities. Speech is fluent.  Psychiatric: Judgment and insight are intact. Affect is appropriate. Breast exam reveals, her left breast has healing surgical scars and there is still some drainage on a gauze dressing from recent drain removal.   Lab Findings: Lab Results  Component Value Date   WBC 4.5 10/29/2017   HGB  10.7 (L) 10/29/2017   HCT 31.4 (L) 10/29/2017   MCV 96.3 10/29/2017   PLT 328 10/29/2017    _0 @  Radiographic Findings: Mm Breast Surgical Specimen  Result Date: 10/14/2017 CLINICAL DATA:  Status post LEFT breast lumpectomy today after earlier radioactive seed localization. EXAM: SPECIMEN RADIOGRAPH OF THE LEFT BREAST COMPARISON:  Previous exam(s). FINDINGS: Status post excision of the LEFT breast. The radioactive seed and ribbon shaped biopsy marker clip are present, completely intact,  and were marked for pathology. The positions of the radioactive seed and ribbon shaped biopsy marker clip within the specimen were discussed with the OR staff during the procedure. IMPRESSION: Specimen radiograph of the left breast. Electronically Signed   By: Franki Cabot M.D.   On: 10/14/2017 14:14   Mm Breast Surgical Specimen  Result Date: 10/14/2017 CLINICAL DATA:  Left double lumpectomy for 2 areas of biopsy-proven invasive ductal carcinoma treated with neoadjuvant chemotherapy. EXAM: SPECIMEN RADIOGRAPH OF THE LEFT BREAST COMPARISON:  Previous exam(s). FINDINGS: Status post excision of the left breast. The radioactive seed and coil shaped biopsy marker clip are present, completely intact, and were marked for pathology. IMPRESSION: Specimen radiograph of the left breast. Electronically Signed   By: Claudie Revering M.D.   On: 10/14/2017 13:49   Mm Lt Radioactive Seed Loc Mammo Guide  Result Date: 10/13/2017 CLINICAL DATA:  22 year old female for radioactive seed localizations of UPPER-OUTER LEFT breast cancer and LOWER OUTER LEFT breast cancer prior to lumpectomies. EXAM: MAMMOGRAPHIC GUIDED RADIOACTIVE SEED LOCALIZATION OF THE LEFT BREAST X 2 COMPARISON:  Previous exam(s). FINDINGS: Patient presents for radioactive seed localizations prior to 2 LEFT lumpectomies. I met with the patient and we discussed the procedure of seed localization including benefits and alternatives. We discussed the high likelihood of successful procedures. We discussed the risks of the procedure including infection, bleeding, tissue injury and further surgery. We discussed the low dose of radioactivity involved in the procedures. Informed, written consent was given. The usual time-out protocol was performed immediately prior to the procedures. UPPER-OUTER LEFT BREAST CANCER SEED LOCALIZATION: Using mammographic guidance, sterile technique, 1% lidocaine and an I-125 radioactive seed, the COIL shaped clip within the UPPER-OUTER  LEFT breast was localized using a LATERAL approach. The follow-up mammogram images confirm the seed in the expected location and were marked for Dr. Barry Dienes. Follow-up survey of the patient confirms presence of the radioactive seed. Order number of I-125 seed:  [id number]. Total activity:  3.729 millicuries.  Reference Date: 09/24/2017 LOWER OUTER LEFT BREAST CANCER SEED LOCALIZATION: Using mammographic guidance, sterile technique, 1% lidocaine and an I-125 radioactive seed, the RIBBON shaped clip within the LOWER OUTER LEFT breast was localized using a LATERAL approach. The follow-up mammogram images confirm the seed in the expected location and were marked for Dr. Barry Dienes. Follow-up survey of the patient confirms presence of the radioactive seed. Order number of I-125 seed:  [id number]. Total activity:  8.022 millicuries.  Reference Date: 10/08/2017 The patient tolerated the procedures well and was released from the [facility]. She was given instructions regarding seed removal. IMPRESSION: Two separate radioactive seed localizations of the LEFT breast. No apparent complications. Electronically Signed   By: Margarette Canada M.D.   On: 10/13/2017 15:18   Mm Lt Rad Seed Ea Add Lesion Loc Mammo  Result Date: 10/13/2017 CLINICAL DATA:  22 year old female for radioactive seed localizations of UPPER-OUTER LEFT breast cancer and LOWER OUTER LEFT breast cancer prior to lumpectomies. EXAM: MAMMOGRAPHIC GUIDED RADIOACTIVE SEED LOCALIZATION OF THE LEFT BREAST  X 2 COMPARISON:  Previous exam(s). FINDINGS: Patient presents for radioactive seed localizations prior to 2 LEFT lumpectomies. I met with the patient and we discussed the procedure of seed localization including benefits and alternatives. We discussed the high likelihood of successful procedures. We discussed the risks of the procedure including infection, bleeding, tissue injury and further surgery. We discussed the low dose of radioactivity involved in the procedures.  Informed, written consent was given. The usual time-out protocol was performed immediately prior to the procedures. UPPER-OUTER LEFT BREAST CANCER SEED LOCALIZATION: Using mammographic guidance, sterile technique, 1% lidocaine and an I-125 radioactive seed, the COIL shaped clip within the UPPER-OUTER LEFT breast was localized using a LATERAL approach. The follow-up mammogram images confirm the seed in the expected location and were marked for Dr. Barry Dienes. Follow-up survey of the patient confirms presence of the radioactive seed. Order number of I-125 seed:  [id number]. Total activity:  8.413 millicuries.  Reference Date: 09/24/2017 LOWER OUTER LEFT BREAST CANCER SEED LOCALIZATION: Using mammographic guidance, sterile technique, 1% lidocaine and an I-125 radioactive seed, the RIBBON shaped clip within the LOWER OUTER LEFT breast was localized using a LATERAL approach. The follow-up mammogram images confirm the seed in the expected location and were marked for Dr. Barry Dienes. Follow-up survey of the patient confirms presence of the radioactive seed. Order number of I-125 seed:  [id number]. Total activity:  5.366 millicuries.  Reference Date: 10/08/2017 The patient tolerated the procedures well and was released from the [place]. She was given instructions regarding seed removal. IMPRESSION: Two separate radioactive seed localizations of the LEFT breast. No apparent complications. Electronically Signed   By: Margarette Canada M.D.   On: 10/13/2017 15:18    Impression/Plan: She will see a physical therapist to restore her ROM. She tentatively will be scheduled for simulation in the second half of August. She and her brother, who was present today, know to call us if her ROM is not adequate by then. She knows to not get pregnant during radiotherapy and adds she is not sexually active. Pregnancy test was negative less than a month ago.  We discussed adjuvant radiotherapy today.  I recommend radiotherapy to the left breast  and regional nodes in order to reduce risk of local regional recurrence by two thirds.  The risks, benefits and side effects of this treatment were discussed in detail.  She understands that radiotherapy is associated with skin irritation and fatigue in the acute setting. Late effects can include cosmetic changes and rare injury to internal organs.   She is enthusiastic about proceeding with treatment. A consent form has been  signed and placed in her chart.  A total of 5 medically necessary complex treatment devices will be fabricated and supervised by me: 4 fields with MLCs for custom blocks to protect heart, and lungs;  and, a Vac-lok. MORE COMPLEX DEVICES MAY BE MADE IN DOSIMETRY FOR FIELD IN FIELD BEAMS FOR DOSE HOMOGENEITY.  I have requested : 3D Simulation which is medically necessary to give adequate dose to at risk tissues while sparing lungs and heart.  I have requested a DVH of the following structures: lungs, heart, lumpectomy cavities.  The patient will receive 50 Gy in 25 fractions to the left breast and regional nodes with 4 fields.  This will be  followed by a boost.  I spent 25 minutes  face to face with the patient and more than 50% of that time was spent in counseling and/or coordination of care.   _____________________________________  Eppie Gibson, MD  This document serves as a record of services personally performed by Eppie Gibson, MD. It was created on her behalf by Margit Banda, a trained medical scribe. The creation of this record is based on the scribe's personal observations and the provider's statements to them. This document has been checked and approved by the attending provider.

## 2017-11-09 NOTE — Patient Instructions (Signed)

## 2017-11-09 NOTE — Therapy (Signed)
[place], [place], [address] Phone: [phone]   Fax:  [phone]  Physical Therapy Evaluation  Patient Details  Name: Carrie Miller MRN: [id number] Date of Birth: [birth year] Referring Provider: Dr. Isidore Moos    Encounter Date: 11/09/2017  PT End of Session - 11/09/17 1716    Visit Number  1    Number of Visits  4    Date for PT Re-Evaluation  12/10/17    PT Start Time  7824    PT Stop Time  1645    PT Time Calculation (min)  40 min    Activity Tolerance  Patient limited by pain    Behavior During Therapy  [facility] for tasks assessed/performed       Past Medical History:  Diagnosis Date  . Breast cancer ([place]) left  . Family history of cancer   . Genetic testing 04/29/2017   Common Cancers panel (47 genes) @ [facility] - No pathoagenic mutations detected    Past Surgical History:  Procedure Laterality Date  . BREAST LUMPECTOMY WITH RADIOACTIVE SEED AND AXILLARY LYMPH NODE DISSECTION Left 10/14/2017   Procedure: LEFT BREAST LUMPECTOMY X 2 WITH RADIOACTIVE SEED X  2 AND LEFT AXILLARY LYMPH NODE DISSECTION;  Surgeon: [name], MD;  Location: [place];  Service: General;  Laterality: Left;  . IR FLUORO GUIDE PORT INSERTION RIGHT  05/03/2017  . IR US GUIDE VASC ACCESS RIGHT  05/03/2017  . PORT-A-CATH REMOVAL N/A 10/14/2017   Procedure: REMOVAL PORT-A-CATH;  Surgeon: [name], MD;  Location: [place];  Service: General;  Laterality: N/A;    There were no vitals filed for this visit.   Subjective Assessment - 11/09/17 1604    Subjective  still having pain in her armpit and some numbness , arm feels heavy when she tries to lift it     Patient is accompained by:  Family member    Pertinent History  stage IIIC triple negative breast cancer with Ki 67 of 80% diagnosed 04/15/2017  2018.  Pt underwent chemotherapy 1/23-5/30/ 2019 with complete therpeutic response.  Last treatment cancelled due to  peripheral neuropathty.  Left lumpectomy with 17 nodes removed on 10/14/2017. Drains removed 11/08/2017  will have radiation therapy     Currently in Pain?  Yes    Pain Score  4     Pain Location  -- where the drainage tube was     Pain Orientation  Left    Pain Descriptors / Indicators  Sore    Pain Type  Surgical pain    Pain Onset  1 to 4 weeks ago    Aggravating Factors   movement     Pain Relieving Factors  medicine     Effect of Pain on Daily Activities  She has to modify how she does things          [facility] PT Assessment - 11/09/17 0001      Assessment   Medical Diagnosis  left breast cancer     Referring Provider  Dr. Isidore Moos     Onset Date/Surgical Date  04/06/17    Hand Dominance  Right      Precautions   Precautions  Other (comment)    Precaution Comments  lifting no more than 4 #       Restrictions   Weight Bearing Restrictions  No      Balance Screen   Has the patient fallen in the past 6 months  No  Has the patient had a decrease in activity level because of a fear of falling?   No    Is the patient reluctant to leave their home because of a fear of falling?   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     Leisure  not doing exercise at this time, but wants to start walking      Cognition   Overall Cognitive Status  Within Functional Limits for tasks assessed      Observation/Other Assessments   Observations  pt comes in wearing her pink bandeau with straps intact  She states she feels her left brerast is swollen     Skin Integrity  lateral breast and and lateral chest are covered with guaze dressings did not remove     Quick DASH   68.18      Observation/Other Assessments-Edema    Edema  -- possible edema left  breast /lateral chest, difficult to meas      Sensation   Additional Comments  pt reports numbness in left upper arm       Coordination   Gross Motor Movements are Fluid and Coordinated  Yes      Posture/Postural Control   Posture/Postural Control  Postural limitations    Postural Limitations  Rounded Shoulders;Forward head      AROM   Overall AROM Comments  very limited by pain, pt will not allow active assist to left shoulder     Right Shoulder Flexion  140 Degrees    Right Shoulder ABduction  150 Degrees    Right Shoulder External Rotation  76 Degrees    Left Shoulder Flexion  85 Degrees    Left Shoulder ABduction  45 Degrees    Left Shoulder External Rotation  75 Degrees      Palpation   Palpation comment  tender to papation         LYMPHEDEMA/ONCOLOGY QUESTIONNAIRE - 11/09/17 1639      Type   Cancer Type  breast cancer       Surgeries   Lumpectomy Date  10/14/17    Number Lymph Nodes Removed  17      Treatment   Past Chemotherapy Treatment  Yes    Date  09/16/17      What other symptoms do you have   Are you Having Heaviness or Tightness  Yes    Are you having Pain  Yes    Are you having pitting edema  No    Is it Hard or Difficult finding clothes that fit  No    Do you have infections  No    Stemmer Sign  No      Right Upper Extremity Lymphedema   10 cm Proximal to Olecranon Process  34.5 cm    Olecranon Process  27.5 cm    15 cm Proximal to Ulnar Styloid Process  28 cm    10 cm Proximal to Ulnar Styloid Process  25.5 cm    Just Proximal to Ulnar Styloid Process  17.8 cm    Across Hand at [employer]  21 cm    At [place] of 2nd Digit  7 cm  Left Upper Extremity Lymphedema   10 cm Proximal to Olecranon Process  35 cm    Olecranon Process  27.5 cm    15 cm Proximal to Ulnar Styloid Process  28 cm    10 cm Proximal to Ulnar Styloid Process  25.5 cm    Just Proximal to Ulnar Styloid Process  17.8 cm    Across Hand at [employer]  21 cm    At  [place] of 2nd Digit  7 cm          Quick Dash - 11/09/17 0001    Open a tight or new jar  Severe difficulty    Do heavy household chores (wash walls, wash floors)  Severe difficulty    Carry a shopping bag or briefcase  Severe difficulty    Wash your back  Moderate difficulty    Use a knife to cut food  Severe difficulty    Recreational activities in which you take some force or impact through your arm, shoulder, or hand (golf, hammering, tennis)  Severe difficulty    During the past week, to what extent has your arm, shoulder or hand problem interfered with your normal social activities with family, friends, neighbors, or groups?  Modererately    During the past week, to what extent has your arm, shoulder or hand problem limited your work or other regular daily activities  Quite a bit    Arm, shoulder, or hand pain.  Moderate    Tingling (pins and needles) in your arm, shoulder, or hand  Severe    Difficulty Sleeping  Severe difficulty    DASH Score  68.18 %        Objective measurements completed on examination: See above findings.              PT Education - 11/09/17 1715    Education Details  supine dowel rod exercise within tolerance.  instructed pt to use pillow to support left arm if needed     Person(s) Educated  Patient;Caregiver(s)    Methods  Explanation;Demonstration;Handout    Comprehension  Need further instruction;Verbalized understanding          PT Long Term Goals - 11/09/17 1724      PT LONG TERM GOAL #1   Title  Pt will have 125 degrees of left shoulder abduction so that she can receive radiation therapy for treatment of breast cancer     Baseline  45    Time  4    Period  Weeks    Status  New      PT LONG TERM GOAL #2   Title  Pt will be independent in a basic home exercise program for shoulder Range of motion     Baseline  no knowledge     Time  4    Period  Weeks    Status  New      PT LONG TERM GOAL #3   Title  Pt will have an  understanding of lymphedema risk reduction practices     Baseline  no knowledge     Time  4    Period  Weeks    Status  New      PT LONG TERM GOAL #4   Title  Pt will decrease quick Dash score to < 50 indicating an improvment in functional use of left arm     Baseline  68.18    Time  4    Period  Weeks  Status  New             Plan - 11/09/17 1717    Clinical Impression Statement  22 yo female with triple negative breast cancer who has had a complete therapeutic response to chemotherapy comes to PT S/P lumpectomy with 17 nodes removed to prepare ROM for radiation. She is extremely limited by pain and just had her drain out yesterday.  She will need PT for strengthening and ROM and education for lymphedema risk reduction including getting a prophylactic compression sleeve.  She currently has no difference in left and right arm circumference     History and Personal Factors relevant to plan of care:  previous chemotherapy, limited English speaking     Clinical Presentation  Evolving    Clinical Presentation due to:  to have radiation therapy     Clinical Decision Making  Moderate    Rehab Potential  Good    Clinical Impairments Affecting Rehab Potential  17 nodes removed with deep axillary dissection     PT Frequency  1x / week    PT Duration  3 weeks    PT Treatment/Interventions  ADLs/Self Care Home Management;DME Instruction;Therapeutic exercise;Therapeutic activities;Patient/family education;Orthotic Fit/Training;Manual lymph drainage;Taping;Manual techniques;Passive range of motion;Scar mobilization    PT Next Visit Plan  continue and progress  HEP for ROM  to prepare for radiation  instruct brother     PT Home Exercise Plan  supine dowel rod exercise     Consulted and Agree with Plan of Care  Patient;Family member/caregiver    Family Member Consulted  brother        Patient will benefit from skilled therapeutic intervention in order to improve the following deficits and  impairments:  Decreased skin integrity, Decreased knowledge of use of DME, Increased fascial restricitons, Pain, Postural dysfunction, Decreased scar mobility, Decreased activity tolerance, Decreased endurance, Decreased range of motion, Decreased strength, Impaired UE functional use, Obesity, Impaired flexibility, Decreased knowledge of precautions  Visit Diagnosis: Aftercare following surgery for neoplasm - Plan: PT plan of care cert/re-cert  Stiffness of left shoulder joint - Plan: PT plan of care cert/re-cert  Acute pain of left shoulder - Plan: PT plan of care cert/re-cert  Abnormal posture - Plan: PT plan of care cert/re-cert  Muscle weakness (generalized) - Plan: PT plan of care cert/re-cert     Problem List Patient Active Problem List   Diagnosis Date Noted  . 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 Anemia due to antineoplastic chemotherapy 09/01/2017  . Peripheral neuropathy due to chemotherapy ([place]) 09/01/2017  . Port-A-Cath in place 05/12/2017  . Genetic testing 04/29/2017  . Family history of cancer   . Cancer of overlapping sites of left breast ([place]) 04/28/2017   Donato Heinz. Owens Shark PT  Norwood Levo 11/09/2017, 5:30 PM  [place], [place], [address] Phone: [phone]   Fax:  [phone]  Name: Layani Foronda MRN: [id number] Date of Birth: Dec 02, 1995

## 2017-11-11 ENCOUNTER — Encounter: Payer: Medicaid Other | Admitting: Physical Therapy

## 2017-11-18 ENCOUNTER — Ambulatory Visit: Payer: Medicaid Other

## 2017-11-22 ENCOUNTER — Ambulatory Visit: Payer: Medicaid Other | Attending: General Surgery

## 2017-11-22 DIAGNOSIS — M25512 Pain in left shoulder: Secondary | ICD-10-CM | POA: Insufficient documentation

## 2017-11-22 DIAGNOSIS — M6281 Muscle weakness (generalized): Secondary | ICD-10-CM | POA: Insufficient documentation

## 2017-11-22 DIAGNOSIS — M25612 Stiffness of left shoulder, not elsewhere classified: Secondary | ICD-10-CM | POA: Insufficient documentation

## 2017-11-22 DIAGNOSIS — R293 Abnormal posture: Secondary | ICD-10-CM | POA: Insufficient documentation

## 2017-11-22 DIAGNOSIS — Z483 Aftercare following surgery for neoplasm: Secondary | ICD-10-CM | POA: Insufficient documentation

## 2017-11-25 ENCOUNTER — Ambulatory Visit: Payer: Medicaid Other | Admitting: Physical Therapy

## 2017-12-02 ENCOUNTER — Encounter: Payer: Self-pay | Admitting: Physical Therapy

## 2017-12-02 ENCOUNTER — Ambulatory Visit: Payer: Medicaid Other | Admitting: Physical Therapy

## 2017-12-02 DIAGNOSIS — M25612 Stiffness of left shoulder, not elsewhere classified: Secondary | ICD-10-CM

## 2017-12-02 DIAGNOSIS — M25512 Pain in left shoulder: Secondary | ICD-10-CM

## 2017-12-02 DIAGNOSIS — Z483 Aftercare following surgery for neoplasm: Secondary | ICD-10-CM | POA: Diagnosis present

## 2017-12-02 DIAGNOSIS — M6281 Muscle weakness (generalized): Secondary | ICD-10-CM | POA: Diagnosis present

## 2017-12-02 DIAGNOSIS — R293 Abnormal posture: Secondary | ICD-10-CM | POA: Diagnosis present

## 2017-12-02 NOTE — Therapy (Signed)
[place], [place], [address] Phone: [phone]   Fax:  [phone]  Physical Therapy Treatment  Patient Details  Name: Carrie Miller MRN: [id number] Date of Birth: 12-19-1995 Referring Provider: Dr. Isidore Moos    Encounter Date: 12/02/2017  PT End of Session - 12/02/17 1339    Visit Number  2    Number of Visits  4    Date for PT Re-Evaluation  12/10/17    PT Start Time  1250   pt did not need more time    PT Stop Time  1320    PT Time Calculation (min)  30 min    Activity Tolerance  Patient tolerated treatment well    Behavior During Therapy  [facility] for tasks assessed/performed       Past Medical History:  Diagnosis Date  . Breast cancer ([place]) left  . Family history of cancer   . Genetic testing 04/29/2017   Common Cancers panel (47 genes) @ [facility] - No pathoagenic mutations detected    Past Surgical History:  Procedure Laterality Date  . BREAST LUMPECTOMY WITH RADIOACTIVE SEED AND AXILLARY LYMPH NODE DISSECTION Left 10/14/2017   Procedure: LEFT BREAST LUMPECTOMY X 2 WITH RADIOACTIVE SEED X  2 AND LEFT AXILLARY LYMPH NODE DISSECTION;  Surgeon: [name], MD;  Location: [facility];  Service: General;  Laterality: Left;  . IR FLUORO GUIDE PORT INSERTION RIGHT  05/03/2017  . IR US GUIDE VASC ACCESS RIGHT  05/03/2017  . PORT-A-CATH REMOVAL N/A 10/14/2017   Procedure: REMOVAL PORT-A-CATH;  Surgeon: [name], MD;  Location: [facility];  Service: General;  Laterality: N/A;    There were no vitals filed for this visit.  Subjective Assessment - 12/02/17 1254    Subjective  Pt states she is not doing taking any medicine, She is doing her arm exercise at home.     Patient is accompained by:  Family member    Pertinent History  stage IIIC triple negative breast cancer with Ki 67 of 80% diagnosed 04/15/2017  2018.  Pt underwent chemotherapy 1/23-5/30/ 2019 with complete therpeutic response.  Last  treatment cancelled due to peripheral neuropathty.  Left lumpectomy with 17 nodes removed on 10/14/2017. Drains removed 11/08/2017  will have radiation therapy     Patient Stated Goals  to do her exercises at home     Currently in Pain?  No/denies         [facility] PT Assessment - 12/02/17 0001      AROM   Right Shoulder Flexion  165 Degrees    Right Shoulder ABduction  160 Degrees    Left Shoulder Flexion  149 Degrees    Left Shoulder ABduction  130 Degrees                   [facility] Adult PT Treatment/Exercise - 12/02/17 0001      Exercises   Exercises  Shoulder      Shoulder Exercises: Supine   Protraction  AROM;Right;10 reps      Shoulder Exercises: Standing   Other Standing Exercises  stand stretches with dowel rod for flexion, abduction and bilateral shoulder extension with 5 second hold.  pt instructed to continue doing those stretches throughout radiation as she is able.     Other Standing Exercises  doorway stretch       Shoulder Exercises: Pulleys   Flexion  2 minutes    Flexion Limitations  cues to stretch at the end  range     ABduction  2 minutes      Shoulder Exercises: ROM/Strengthening   Wall Wash  with folded towel in and out and circumduction       Shoulder Exercises: Stretch   Other Shoulder Stretches  practiced left hand behind head, head turn to right and deep breath hold.              PT Education - 12/02/17 1338    Education Details  upgraded home program to wall wash, doorway stretch and standing dowel     Person(s) Educated  Patient    Methods  Explanation;Demonstration;Handout    Comprehension  Verbalized understanding;Returned demonstration          PT Long Term Goals - 12/02/17 1342      PT LONG TERM GOAL #1   Title  Pt will have 125 degrees of left shoulder abduction so that she can receive radiation therapy for treatment of breast cancer     Status  Achieved      PT LONG TERM GOAL #2   Title  Pt will be independent in a  basic home exercise program for shoulder Range of motion     Status  Achieved      PT LONG TERM GOAL #3   Title  Pt will have an understanding of lymphedema risk reduction practices     Status  Achieved      PT LONG TERM GOAL #4   Title  Pt will decrease quick Dash score to < 50 indicating an improvment in functional use of left arm     Baseline  68.18, not assessed at discharge     Status  Deferred            Plan - 12/02/17 1340    Clinical Impression Statement  Pt has done well with shoulder ROM and is ready for radiation treatment.  Upgraded home program for standing dowel stretches and wall stretches and informed her and brother briefly about lymphedema risk reduction practices. They were asked to come back after radiation with new referral if they have any problems in the future     Clinical Impairments Affecting Rehab Potential  17 nodes removed with deep axillary dissection     PT Next Visit Plan  discharge this episode       Patient will benefit from skilled therapeutic intervention in order to improve the following deficits and impairments:  Decreased skin integrity, Decreased knowledge of use of DME, Increased fascial restricitons, Pain, Postural dysfunction, Decreased scar mobility, Decreased activity tolerance, Decreased endurance, Decreased range of motion, Decreased strength, Impaired UE functional use, Obesity, Impaired flexibility, Decreased knowledge of precautions  Visit Diagnosis: Aftercare following surgery for neoplasm  Stiffness of left shoulder joint  Acute pain of left shoulder  Abnormal posture  Muscle weakness (generalized)     Problem List Patient Active Problem List   Diagnosis Date Noted  . Breast cancer metastasized to axillary lymph node, left ([place]) 10/14/2017  . Anemia due to antineoplastic chemotherapy 09/01/2017  . Peripheral neuropathy due to chemotherapy ([place]) 09/01/2017  . Port-A-Cath in place 05/12/2017  . Genetic testing  04/29/2017  . Family history of cancer   . Cancer of overlapping sites of left breast ([place]) 04/28/2017   PHYSICAL THERAPY DISCHARGE SUMMARY  Visits from Start of Care: 2  Current functional level related to goals / functional outcomes: Pt is independent in home exercise    Remaining deficits: Pt is working on end range  stretching    Education / Equipment: Home exercise lymphedema risk reduction  Plan: Patient agrees to discharge.  Patient goals were not met. Patient is being discharged due to being pleased with the current functional level.  ?????    Donato Heinz. Owens Shark PT  Norwood Levo 12/02/2017, 1:44 PM  [place], [place], [address] Phone: [phone]   Fax:  [phone]  Name: Arturo Freundlich MRN: [id number] Date of Birth: 08/21/1995

## 2017-12-02 NOTE — Patient Instructions (Signed)

## 2017-12-03 ENCOUNTER — Encounter: Payer: Medicaid Other | Admitting: Physical Therapy

## 2017-12-06 ENCOUNTER — Ambulatory Visit: Payer: Medicaid Other

## 2017-12-06 ENCOUNTER — Ambulatory Visit
Admission: RE | Admit: 2017-12-06 | Discharge: 2017-12-06 | Disposition: A | Discharge status: Home / Self Care | Payer: Medicaid Other | Source: Ambulatory Visit | Attending: Radiation Oncology | Admitting: Radiation Oncology

## 2017-12-06 DIAGNOSIS — C50812 Malignant neoplasm of overlapping sites of left female breast: Secondary | ICD-10-CM | POA: Insufficient documentation

## 2017-12-06 DIAGNOSIS — Z170 Estrogen receptor positive status [ER+]: Secondary | ICD-10-CM | POA: Insufficient documentation

## 2017-12-06 DIAGNOSIS — Z510 Encounter for antineoplastic radiation therapy: Secondary | ICD-10-CM | POA: Insufficient documentation

## 2017-12-06 NOTE — Progress Notes (Signed)
Radiation Oncology         [phone] ________________________________  Name: Carrie Miller MRN: [id number]  Date: 12/06/2017  DOB: 07/29/1995  SIMULATION AND TREATMENT PLANNING NOTE  // Special treatment procedure  Outpatient  DIAGNOSIS:     ICD-10-CM   1. Cancer of overlapping sites of left breast [facility]) C50.812     NARRATIVE:  The patient was brought to the [facility].  Identity was confirmed.  All relevant records and images related to the planned course of therapy were reviewed.  The patient freely provided informed written consent to proceed with treatment after reviewing the details related to the planned course of therapy. The consent form was witnessed and verified by the simulation staff.    Then, the patient was set-up in a stable reproducible supine position for radiation therapy with her ipsilateral arm over her head, and her upper body secured in a custom-made Vac-lok device.  CT images were obtained.  Surface markings were placed.  The CT images were loaded into the planning software.    Special treatment procedure:  Special treatment procedure was performed today due to the extra time and effort required by myself to plan and prepare this patient for deep inspiration breath hold technique.  I have determined cardiac sparing to be of benefit to this patient to prevent long term cardiac damage due to radiation of the heart.  Bellows were placed on the patient's abdomen. To facilitate cardiac sparing, the patient was coached by the radiation therapists on breath hold techniques and breathing practice was performed. Practice waveforms were obtained. The patient was then scanned while maintaining breath hold in the treatment position.  This image was then transferred over to the imaging specialist. The imaging specialist then created a fusion of the free breathing and breath hold scans using the chest wall as the stable structure. I personally reviewed the  fusion in axial, coronal and sagittal image planes.  Excellent cardiac sparing was obtained.  I felt the patient is an appropriate candidate for breath hold and the patient will be treated as such.  The image fusion was then reviewed with the patient to reinforce the necessity of reproducible breath hold.  TREATMENT PLANNING NOTE: Treatment planning then occurred.  The radiation prescription was entered and confirmed.     A total of 5 medically necessary complex treatment devices were fabricated and supervised by me: 4 fields with MLCs for custom blocks to protect heart, and lungs;  and, a Vac-lok. MORE COMPLEX DEVICES MAY BE MADE IN DOSIMETRY FOR FIELD IN FIELD BEAMS FOR DOSE HOMOGENEITY.  I have requested : 3D Simulation which is medically necessary to give adequate dose to at risk tissues while sparing lungs and heart.  I have requested a DVH of the following structures: lungs, heart, lumpectomy cavity .    The patient will receive 50 Gy in 25 fractions to the left breast, IM nodes, SCV nodes, and axilla with 4 fields. Bolus planned on the breast due to inflammatory history.   This will be followed by a boost.  Optical Surface Tracking Plan:  Since intensity modulated radiotherapy (IMRT) and 3D conformal radiation treatment methods are predicated on accurate and precise positioning for treatment, intrafraction motion monitoring is medically necessary to ensure accurate and safe treatment delivery. The ability to quantify intrafraction motion without excessive ionizing radiation dose can only be performed with optical surface tracking. Accordingly, surface imaging offers the opportunity to obtain 3D measurements of patient position throughout IMRT  and 3D treatments without excessive radiation exposure. I am ordering optical surface tracking for this patient's upcoming course of radiotherapy.  ________________________________   Reference:  Ursula Alert, J, et al. Surface imaging-based  analysis of intrafraction motion for breast radiotherapy patients.Journal of [place], n. 6, nov. 2014. ISSN [id number].  Available at: <http://www.jacmp.org/index.php/jacmp/article/view/4957>.    -----------------------------------  Eppie Gibson, MD

## 2017-12-07 DIAGNOSIS — Z510 Encounter for antineoplastic radiation therapy: Secondary | ICD-10-CM | POA: Diagnosis not present

## 2017-12-09 ENCOUNTER — Ambulatory Visit
Admission: RE | Admit: 2017-12-09 | Discharge: 2017-12-09 | Disposition: A | Discharge status: Home / Self Care | Payer: Medicaid Other | Source: Ambulatory Visit | Attending: Radiation Oncology | Admitting: Radiation Oncology

## 2017-12-09 DIAGNOSIS — C773 Secondary and unspecified malignant neoplasm of axilla and upper limb lymph nodes: Principal | ICD-10-CM

## 2017-12-09 DIAGNOSIS — Z510 Encounter for antineoplastic radiation therapy: Secondary | ICD-10-CM | POA: Diagnosis not present

## 2017-12-09 DIAGNOSIS — C50912 Malignant neoplasm of unspecified site of left female breast: Secondary | ICD-10-CM

## 2017-12-09 MED ORDER — RADIAPLEXRX EX GEL
Freq: Once | CUTANEOUS | Status: AC
  Administered 2017-12-09: 15:00:00 via TOPICAL

## 2017-12-09 MED ORDER — ALRA NON-METALLIC DEODORANT (RAD-ONC)
1.0000 "application " | Freq: Once | TOPICAL | Status: AC
  Administered 2017-12-09: 1 via TOPICAL

## 2017-12-09 NOTE — Progress Notes (Signed)

## 2017-12-10 ENCOUNTER — Ambulatory Visit
Admission: RE | Admit: 2017-12-10 | Discharge: 2017-12-10 | Disposition: A | Discharge status: Home / Self Care | Payer: Medicaid Other | Source: Ambulatory Visit | Attending: Radiation Oncology | Admitting: Radiation Oncology

## 2017-12-10 ENCOUNTER — Telehealth: Payer: Self-pay | Admitting: Hematology

## 2017-12-10 DIAGNOSIS — Z510 Encounter for antineoplastic radiation therapy: Secondary | ICD-10-CM | POA: Diagnosis not present

## 2017-12-10 NOTE — Telephone Encounter (Signed)
Called pt's brother to discuss her ride not being confirmed for today. Changed the phone number in her [employer] profile to his number so that he can respond to the messages. Spoke with him and confirmed her ride for today.

## 2017-12-13 ENCOUNTER — Ambulatory Visit
Admission: RE | Admit: 2017-12-13 | Discharge: 2017-12-13 | Disposition: A | Discharge status: Home / Self Care | Payer: Medicaid Other | Source: Ambulatory Visit | Attending: Radiation Oncology | Admitting: Radiation Oncology

## 2017-12-13 DIAGNOSIS — Z510 Encounter for antineoplastic radiation therapy: Secondary | ICD-10-CM | POA: Diagnosis not present

## 2017-12-14 ENCOUNTER — Ambulatory Visit
Admission: RE | Admit: 2017-12-14 | Discharge: 2017-12-14 | Disposition: A | Discharge status: Home / Self Care | Payer: Medicaid Other | Source: Ambulatory Visit | Attending: Radiation Oncology | Admitting: Radiation Oncology

## 2017-12-14 DIAGNOSIS — Z510 Encounter for antineoplastic radiation therapy: Secondary | ICD-10-CM | POA: Diagnosis not present

## 2017-12-15 ENCOUNTER — Ambulatory Visit
Admission: RE | Admit: 2017-12-15 | Discharge: 2017-12-15 | Disposition: A | Discharge status: Home / Self Care | Payer: Medicaid Other | Source: Ambulatory Visit | Attending: Radiation Oncology | Admitting: Radiation Oncology

## 2017-12-15 DIAGNOSIS — Z510 Encounter for antineoplastic radiation therapy: Secondary | ICD-10-CM | POA: Diagnosis not present

## 2017-12-16 ENCOUNTER — Ambulatory Visit
Admission: RE | Admit: 2017-12-16 | Discharge: 2017-12-16 | Disposition: A | Discharge status: Home / Self Care | Payer: Medicaid Other | Source: Ambulatory Visit | Attending: Radiation Oncology | Admitting: Radiation Oncology

## 2017-12-16 DIAGNOSIS — Z510 Encounter for antineoplastic radiation therapy: Secondary | ICD-10-CM | POA: Diagnosis not present

## 2017-12-17 ENCOUNTER — Ambulatory Visit
Admission: RE | Admit: 2017-12-17 | Discharge: 2017-12-17 | Disposition: A | Discharge status: Home / Self Care | Payer: Medicaid Other | Source: Ambulatory Visit | Attending: Radiation Oncology | Admitting: Radiation Oncology

## 2017-12-17 DIAGNOSIS — Z510 Encounter for antineoplastic radiation therapy: Secondary | ICD-10-CM | POA: Diagnosis not present

## 2017-12-21 ENCOUNTER — Ambulatory Visit
Admission: RE | Admit: 2017-12-21 | Discharge: 2017-12-21 | Disposition: A | Discharge status: Home / Self Care | Payer: Medicaid Other | Source: Ambulatory Visit | Attending: Radiation Oncology | Admitting: Radiation Oncology

## 2017-12-21 DIAGNOSIS — C50812 Malignant neoplasm of overlapping sites of left female breast: Secondary | ICD-10-CM | POA: Insufficient documentation

## 2017-12-21 DIAGNOSIS — Z170 Estrogen receptor positive status [ER+]: Secondary | ICD-10-CM | POA: Insufficient documentation

## 2017-12-21 DIAGNOSIS — Z510 Encounter for antineoplastic radiation therapy: Secondary | ICD-10-CM | POA: Insufficient documentation

## 2017-12-22 ENCOUNTER — Ambulatory Visit
Admission: RE | Admit: 2017-12-22 | Discharge: 2017-12-22 | Disposition: A | Discharge status: Home / Self Care | Payer: Medicaid Other | Source: Ambulatory Visit | Attending: Radiation Oncology | Admitting: Radiation Oncology

## 2017-12-22 DIAGNOSIS — Z510 Encounter for antineoplastic radiation therapy: Secondary | ICD-10-CM | POA: Diagnosis not present

## 2017-12-23 ENCOUNTER — Ambulatory Visit
Admission: RE | Admit: 2017-12-23 | Discharge: 2017-12-23 | Disposition: A | Discharge status: Home / Self Care | Payer: Medicaid Other | Source: Ambulatory Visit | Attending: Radiation Oncology | Admitting: Radiation Oncology

## 2017-12-23 DIAGNOSIS — Z510 Encounter for antineoplastic radiation therapy: Secondary | ICD-10-CM | POA: Diagnosis not present

## 2017-12-24 ENCOUNTER — Ambulatory Visit
Admission: RE | Admit: 2017-12-24 | Discharge: 2017-12-24 | Disposition: A | Discharge status: Home / Self Care | Payer: Medicaid Other | Source: Ambulatory Visit | Attending: Radiation Oncology | Admitting: Radiation Oncology

## 2017-12-24 DIAGNOSIS — Z510 Encounter for antineoplastic radiation therapy: Secondary | ICD-10-CM | POA: Diagnosis not present

## 2017-12-27 ENCOUNTER — Ambulatory Visit
Admission: RE | Admit: 2017-12-27 | Discharge: 2017-12-27 | Disposition: A | Discharge status: Home / Self Care | Payer: Medicaid Other | Source: Ambulatory Visit | Attending: Radiation Oncology | Admitting: Radiation Oncology

## 2017-12-27 DIAGNOSIS — Z510 Encounter for antineoplastic radiation therapy: Secondary | ICD-10-CM | POA: Diagnosis not present

## 2017-12-28 ENCOUNTER — Ambulatory Visit
Admission: RE | Admit: 2017-12-28 | Discharge: 2017-12-28 | Disposition: A | Discharge status: Home / Self Care | Payer: Medicaid Other | Source: Ambulatory Visit | Attending: Radiation Oncology | Admitting: Radiation Oncology

## 2017-12-28 DIAGNOSIS — Z510 Encounter for antineoplastic radiation therapy: Secondary | ICD-10-CM | POA: Diagnosis not present

## 2017-12-29 ENCOUNTER — Ambulatory Visit
Admission: RE | Admit: 2017-12-29 | Discharge: 2017-12-29 | Disposition: A | Discharge status: Home / Self Care | Payer: Medicaid Other | Source: Ambulatory Visit | Attending: Radiation Oncology | Admitting: Radiation Oncology

## 2017-12-29 DIAGNOSIS — Z510 Encounter for antineoplastic radiation therapy: Secondary | ICD-10-CM | POA: Diagnosis not present

## 2017-12-30 ENCOUNTER — Ambulatory Visit
Admission: RE | Admit: 2017-12-30 | Discharge: 2017-12-30 | Disposition: A | Discharge status: Home / Self Care | Payer: Medicaid Other | Source: Ambulatory Visit | Attending: Radiation Oncology | Admitting: Radiation Oncology

## 2017-12-30 DIAGNOSIS — Z510 Encounter for antineoplastic radiation therapy: Secondary | ICD-10-CM | POA: Diagnosis not present

## 2017-12-31 ENCOUNTER — Ambulatory Visit
Admission: RE | Admit: 2017-12-31 | Discharge: 2017-12-31 | Disposition: A | Discharge status: Home / Self Care | Payer: Medicaid Other | Source: Ambulatory Visit | Attending: Radiation Oncology | Admitting: Radiation Oncology

## 2017-12-31 DIAGNOSIS — Z510 Encounter for antineoplastic radiation therapy: Secondary | ICD-10-CM | POA: Diagnosis not present

## 2018-01-03 ENCOUNTER — Ambulatory Visit: Payer: Medicaid Other | Admitting: Radiation Oncology

## 2018-01-03 ENCOUNTER — Ambulatory Visit
Admission: RE | Admit: 2018-01-03 | Discharge: 2018-01-03 | Disposition: A | Discharge status: Home / Self Care | Payer: Medicaid Other | Source: Ambulatory Visit | Attending: Radiation Oncology | Admitting: Radiation Oncology

## 2018-01-03 DIAGNOSIS — C50812 Malignant neoplasm of overlapping sites of left female breast: Secondary | ICD-10-CM

## 2018-01-03 DIAGNOSIS — Z510 Encounter for antineoplastic radiation therapy: Secondary | ICD-10-CM | POA: Diagnosis not present

## 2018-01-03 MED ORDER — SONAFINE EX EMUL: 1.0000 "application " | Freq: Two times a day (BID) | CUTANEOUS | Status: DC

## 2018-01-03 MED ORDER — SONAFINE EX EMUL
1.0000 "application " | Freq: Two times a day (BID) | CUTANEOUS | Status: DC
  Administered 2018-01-03: 1 via TOPICAL

## 2018-01-04 ENCOUNTER — Ambulatory Visit
Admission: RE | Admit: 2018-01-04 | Discharge: 2018-01-04 | Disposition: A | Discharge status: Home / Self Care | Payer: Medicaid Other | Source: Ambulatory Visit | Attending: Radiation Oncology | Admitting: Radiation Oncology

## 2018-01-04 DIAGNOSIS — Z510 Encounter for antineoplastic radiation therapy: Secondary | ICD-10-CM | POA: Diagnosis not present

## 2018-01-05 ENCOUNTER — Ambulatory Visit
Admission: RE | Admit: 2018-01-05 | Discharge: 2018-01-05 | Disposition: A | Discharge status: Home / Self Care | Payer: Medicaid Other | Source: Ambulatory Visit | Attending: Radiation Oncology | Admitting: Radiation Oncology

## 2018-01-05 DIAGNOSIS — C50912 Malignant neoplasm of unspecified site of left female breast: Secondary | ICD-10-CM

## 2018-01-05 DIAGNOSIS — Z510 Encounter for antineoplastic radiation therapy: Secondary | ICD-10-CM | POA: Diagnosis not present

## 2018-01-05 DIAGNOSIS — C773 Secondary and unspecified malignant neoplasm of axilla and upper limb lymph nodes: Principal | ICD-10-CM

## 2018-01-06 ENCOUNTER — Encounter: Payer: Self-pay | Admitting: Radiation Oncology

## 2018-01-06 ENCOUNTER — Ambulatory Visit
Admission: RE | Admit: 2018-01-06 | Discharge: 2018-01-06 | Disposition: A | Discharge status: Home / Self Care | Payer: Medicaid Other | Source: Ambulatory Visit | Attending: Radiation Oncology | Admitting: Radiation Oncology

## 2018-01-06 DIAGNOSIS — Z510 Encounter for antineoplastic radiation therapy: Secondary | ICD-10-CM | POA: Diagnosis not present

## 2018-01-07 ENCOUNTER — Ambulatory Visit
Admission: RE | Admit: 2018-01-07 | Discharge: 2018-01-07 | Disposition: A | Discharge status: Home / Self Care | Payer: Medicaid Other | Source: Ambulatory Visit | Attending: Radiation Oncology | Admitting: Radiation Oncology

## 2018-01-07 DIAGNOSIS — Z510 Encounter for antineoplastic radiation therapy: Secondary | ICD-10-CM | POA: Diagnosis not present

## 2018-01-10 ENCOUNTER — Ambulatory Visit: Payer: Medicaid Other | Admitting: Radiation Oncology

## 2018-01-10 ENCOUNTER — Ambulatory Visit: Payer: Medicaid Other

## 2018-01-11 ENCOUNTER — Ambulatory Visit: Admission: RE | Admit: 2018-01-11 | Payer: Medicaid Other | Source: Ambulatory Visit

## 2018-01-11 ENCOUNTER — Encounter: Payer: Self-pay | Admitting: Radiation Oncology

## 2018-01-11 ENCOUNTER — Other Ambulatory Visit: Payer: Self-pay | Admitting: Radiation Oncology

## 2018-01-11 DIAGNOSIS — C50912 Malignant neoplasm of unspecified site of left female breast: Secondary | ICD-10-CM

## 2018-01-11 DIAGNOSIS — C773 Secondary and unspecified malignant neoplasm of axilla and upper limb lymph nodes: Principal | ICD-10-CM

## 2018-01-11 MED ORDER — SENNA 8.6 MG PO TABS: 1.0000 | ORAL_TABLET | Freq: Every evening | ORAL | 0 refills | Status: AC | PRN

## 2018-01-11 MED ORDER — TRAMADOL HCL 50 MG PO TABS: 50.0000 mg | ORAL_TABLET | Freq: Four times a day (QID) | ORAL | 0 refills | Status: DC | PRN

## 2018-01-11 MED FILL — traMADol HCL 50 MG TABS: 50 | 5 days supply | Qty: 20 | Fill #0

## 2018-01-11 MED FILL — SENNA 8.6 MG TABS: 8.6 | 20 days supply | Qty: 40 | Fill #0

## 2018-01-12 ENCOUNTER — Ambulatory Visit: Payer: Medicaid Other

## 2018-01-13 ENCOUNTER — Ambulatory Visit: Payer: Medicaid Other

## 2018-01-14 ENCOUNTER — Ambulatory Visit: Admission: RE | Admit: 2018-01-14 | Payer: Medicaid Other | Source: Ambulatory Visit

## 2018-01-14 ENCOUNTER — Ambulatory Visit: Payer: Medicaid Other

## 2018-01-14 ENCOUNTER — Other Ambulatory Visit: Payer: Self-pay | Admitting: Radiation Oncology

## 2018-01-14 DIAGNOSIS — C50912 Malignant neoplasm of unspecified site of left female breast: Secondary | ICD-10-CM

## 2018-01-14 DIAGNOSIS — C773 Secondary and unspecified malignant neoplasm of axilla and upper limb lymph nodes: Principal | ICD-10-CM

## 2018-01-14 MED ORDER — TRAMADOL HCL 50 MG PO TABS: 50.0000 mg | ORAL_TABLET | Freq: Four times a day (QID) | ORAL | 0 refills | Status: AC | PRN

## 2018-01-14 MED FILL — traMADol HCL 50 MG TABS: 50 | 12 days supply | Qty: 90 | Fill #0

## 2018-01-17 ENCOUNTER — Ambulatory Visit: Payer: Medicaid Other

## 2018-01-17 ENCOUNTER — Ambulatory Visit
Admission: RE | Admit: 2018-01-17 | Discharge: 2018-01-17 | Disposition: A | Discharge status: Home / Self Care | Payer: Medicaid Other | Source: Ambulatory Visit | Attending: Radiation Oncology | Admitting: Radiation Oncology

## 2018-01-17 DIAGNOSIS — Z510 Encounter for antineoplastic radiation therapy: Secondary | ICD-10-CM | POA: Diagnosis not present

## 2018-01-18 ENCOUNTER — Ambulatory Visit
Admission: RE | Admit: 2018-01-18 | Discharge: 2018-01-18 | Disposition: A | Discharge status: Home / Self Care | Payer: Medicaid Other | Source: Ambulatory Visit | Attending: Radiation Oncology | Admitting: Radiation Oncology

## 2018-01-18 DIAGNOSIS — C50812 Malignant neoplasm of overlapping sites of left female breast: Secondary | ICD-10-CM | POA: Diagnosis not present

## 2018-01-18 DIAGNOSIS — Z510 Encounter for antineoplastic radiation therapy: Secondary | ICD-10-CM | POA: Insufficient documentation

## 2018-01-18 DIAGNOSIS — Z170 Estrogen receptor positive status [ER+]: Secondary | ICD-10-CM | POA: Insufficient documentation

## 2018-01-19 ENCOUNTER — Ambulatory Visit
Admission: RE | Admit: 2018-01-19 | Discharge: 2018-01-19 | Disposition: A | Discharge status: Home / Self Care | Payer: Medicaid Other | Source: Ambulatory Visit | Attending: Radiation Oncology | Admitting: Radiation Oncology

## 2018-01-19 DIAGNOSIS — Z510 Encounter for antineoplastic radiation therapy: Secondary | ICD-10-CM | POA: Diagnosis not present

## 2018-01-19 DIAGNOSIS — C773 Secondary and unspecified malignant neoplasm of axilla and upper limb lymph nodes: Principal | ICD-10-CM

## 2018-01-19 DIAGNOSIS — C50912 Malignant neoplasm of unspecified site of left female breast: Secondary | ICD-10-CM

## 2018-01-19 MED ORDER — SILVER SULFADIAZINE 1 % EX CREA
TOPICAL_CREAM | Freq: Two times a day (BID) | CUTANEOUS | Status: DC
  Administered 2018-01-19: 12:00:00 via TOPICAL

## 2018-01-19 NOTE — Progress Notes (Signed)
[place]  [phone] [phone]  Clinic Follow Up Note   Patient Care Team: Patient, No Pcp Per as PCP - General ([place]) [name], MD as Consulting Physician (General Surgery) [name], MD as Consulting Physician (Hematology) [name], NP as Nurse Practitioner (Nurse Practitioner)   Date of Service:  01/26/2018  CHIEF COMPLAINTS:  Follow up left breast cancer, triple negative   Oncology History   Cancer Staging Cancer of overlapping sites of left breast [facility]) Staging form: Breast, AJCC 8th Edition - Clinical stage from 04/19/2017: Stage IIIC (cT3(m), cN1, cM0, G3, ER: Negative, PR: Negative, HER2: Negative) - Signed by [name], MD on 04/28/2017 - Pathologic stage from 10/14/2017: No Stage Recommended (ypT0, pN0, cM0, GX, ER: Not Assessed, PR: Not Assessed, HER2: Not Assessed) - Signed by [name], MD on 10/24/2017       Cancer of overlapping sites of left breast ([place])   04/15/2017 Mammogram    IMPRESSION: 1. Highly suspicious irregular mass within the left breast at the 4 o'clock axis, 8 cm from the nipple, measuring 5.2 cm, corresponding to the area of clinical concern. Ultrasound-guided biopsy is recommended. 2. Additional suspicious irregular mass within the left breast at the 2 o'clock axis, 8 cm from the nipple, measuring 1.6 cm, corresponding to the mammographic finding. Ultrasound-guided biopsy is recommended. 3. Additional suspicious mass within the left breast at the 2 o'clock axis, 10 cm from the nipple, axillary tail region, measuring 3 cm, suspected lymph node completely replaced by tumor. Ultrasound-guided biopsy is recommended. 4. No evidence of malignancy within the right breast.     04/19/2017 Initial Biopsy    Diagnosis 1. Breast, left, needle core biopsy, 4:00 o'clock, ribbon clip - INVASIVE DUCTAL CARCINOMA. - SEE COMMENT. 2. Breast, left, needle core biopsy, 2:00 o'clock, coil clip - INVASIVE  DUCTAL CARCINOMA. - SEE COMMENT. 3. Lymph node, needle/core biopsy, left axilla, spiral hydromark - DUCTAL CARCINOMA. - SEE COMMENT.  2. PROGNOSTIC INDICATORS Results: IMMUNOHISTOCHEMICAL AND MORPHOMETRIC ANALYSIS PERFORMED MANUALLY Estrogen Receptor: 0%, NEGATIVE Progesterone Receptor: 0%, NEGATIVE Proliferation Marker Ki67: 80%  2. FLUORESCENCE IN-SITU HYBRIDIZATION Results: HER2 - NEGATIVE RATIO OF HER2/CEP17 SIGNALS 1.53 AVERAGE HER2 COPY NUMBER PER CELL 2.75  Microscopic Comment 1. The carcinoma in the three specimens is morphologically similar and is grade III. Lymph nodal tissue is not definitively identified in specimen #3. A breast prognostic profile will be performed on part 2 and the results reported separately.    04/19/2017 Initial Diagnosis    Cancer of overlapping sites of left breast ([place])    04/30/2017 Breast MRI    IMPRESSION: 1. Biopsy-proven invasive carcinoma within the lower outer quadrant of the LEFT breast, 4 o'clock axis, at posterior depth, measuring 4.8 cm, with associated biopsy clip artifact. 2. Biopsy-proven invasive carcinoma within the upper-outer quadrant of the LEFT breast, 2 o'clock axis, at posterior depth, measuring 2.5 cm, with associated biopsy clip artifact. However, contiguous non mass enhancement along the posterior margin of this 2 o'clock mass and extending 3 cm anteriorly from the mass increases the overall measurement to 5.5 cm greatest dimension (AP). 3. Diffuse edema throughout the LEFT breast, with particularly prominent component of edema extending posteriorly to abut the anterior surface of the pectoralis muscle, and diffuse skin thickening throughout the left breast. This almost certainly indicates INFLAMMATORY BREAST CANCER. 4. Biopsy-proven metastatic lymph node within the LEFT axilla measures 4.1 cm. Two additional borderline prominent lymph nodes within the left axilla. No enlarged lymph  nodes within the right axilla  or internal mammary chain regions. 5. No evidence of malignancy within the RIGHT breast.  RECOMMENDATION: Per current treatment plan for patient's known left breast cancer (2 biopsy-proven sites) and metastatic lymph node in the left axilla.  BI-RADS CATEGORY  6: Known biopsy-proven malignancy.     04/30/2017 Echocardiogram    Study Conclusions  - Left ventricle: The cavity size was normal. Wall thickness was   normal. Systolic function was normal. The estimated ejection   fraction was in the range of 60% to 65%. Wall motion was normal;   there were no regional wall motion abnormalities. Left   ventricular diastolic function parameters were normal. GLS:   -20.4%, RV S vel 12.1cm/s.    05/05/2017 Imaging    MRI BRAIN IMPRESSION: No intracranial or calvarial metastatic disease. Normal MRI of the brain.    05/07/2017 Imaging    CT CAP IMPRESSION: Two left breast masses and a grossly abnormal 3.9 cm lower left axillary lymph node, likely correspond to multifocal left breast cancer metastatic to the left axilla. Skin thickening of the left breast, raising concern for an inflammatory breast cancer.  1 cm hypoattenuated lesion in the posterior dome of the liver with internal attenuation slightly higher than water. This may represent a complicated liver cyst, hemangioma or a focus of metastatic disease.  No other findings to suggest distal metastatic disease.     05/07/2017 Imaging    BONE SCAN IMPRESSION: 1. No evidence of osseous metastases. 2. Abnormal uptake in the soft tissues of the left breast likely by the patient's known breast cancer.     05/11/2017 Genetic Testing    Common Cancers panel (47 genes) @ [facility] - No pathoagenic mutations detected Five Variants of Uncertain Significance were detected:  APC c.2438A>G (p.Asn813Ser)  BARD1 c.782T>C (p.Leu261Pro) MSH3 c.1655C>T (p.Thr552Ile)  MSH6 c.2108T>C (p.Met703Thr)  NTHL1 c.470G>C (p.Arg157Pro)   Genes  Analyzed: 47 genes on [facility]'s Common Cancers panel (APC, ATM, AXIN2, BARD1, BMPR1A, BRCA1, BRCA2, BRIP1, CDH1, CDK4, CDKN2A, CHEK2, CTNNA1, DICER1, EPCAM, GREM1, HOXB13, KIT, MEN1, MLH1, MSH2, MSH3, MSH6, MUTYH, NBN, NF1, NTHL1, PALB2, PDGFRA, PMS2, POLD1, POLE, PTEN, RAD50, RAD51C, RAD51D, SDHA, SDHB, SDHC, SDHD, SMAD4, SMARCA4, STK11, TP53, TSC1, TSC2, VHL).     05/12/2017 - 09/16/2017 Chemotherapy    1. Neoadjuvant AC q2 weeks x4 cycles then weekly 05/12/17 - 06/24/17 followed by carbo/taxol for 12 weeks starting 07/08/17. Due to poor toleration, changed Taxol to abraxane and added Carboplatin with cycle 2.  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  2. Monthly zoladex injections starting 04/30/17 for the duration of chemotherapy.     08/06/2017 Mammogram    The previously demonstrated rounded mass in the 2 o'clock position of the left breast, containing a coil shaped biopsy marker clip, is also significantly smaller, less well-defined and has interspersed fat. This currently measures 0.7 x 0.5 x 0.4 cm in maximum dimensions, previously 1.8 x 1.5 x 1.3 cm mammographically.  IMPRESSION: 1. No evidence of malignancy at the location of recent palpable concern in the upper outer left breast. 2. Marked decrease in size and conspicuity of the biopsy-proven invasive ductal carcinomas in the 2 o'clock and 4 o'clock positions of the left breast with no discrete residual masses. There is only mild ill-defined residual soft tissue density and interspersed fat at those locations and associated architectural distortion in the 4 o'clock position. 3. Complete imaging resolution of the previously biopsied metastatic left inferior axillary lymph node.  09/23/2017 Imaging    Repeated breast MRI showed 1. Significant positive imaging response to chemotherapy with resolution of the previously seen biopsy-proven malignancy over the 4 o'clock position of the left breast with only subtle residual non mass enhancement  in this location measuring 2.0 x 0.9 x 1.5 cm. 2. Complete imaging response with resolution of the biopsy-proven malignancy over the 2 o'clock position of the left breast. 3. Complete resolution of the previously seen diffuse left breast edema and skin thickening. 4. Near complete resolution of the previously biopsy-proven metastatic left axillary lymph node.    10/14/2017 Pathology Results     Diagnosis 10/14/2017 1. Breast, lumpectomy, Left Upper Outer Quadrant - COMPLETE THERAPEUTIC RESPONSE - NO RESIDUAL CARCINOMA. - BIOPSY SITE AND THERAPY- RELATED CHANGES. - SEE ONCOLOGY TABLE. 2. Breast, lumpectomy, Left Lower Outer Quadrant w/seed - COMPLETE THERAPEUTIC RESPONSE - NO RESIDUAL CARCINOMA. - BIOPSY SITE AND THERAPY- RELATED CHANGES. - SEE ONCOLOGY TABLE. 3. Lymph nodes, regional resection, Left Axillary - SEVENTEEN LYMPH NODES, NEGATIVE FOR CARCINOMA (0/17).    10/14/2017 Surgery    Left breast double lumpectomy and axillary lymph node dissection by Dr. [name].     HISTORY OF PRESENTING ILLNESS: 04/28/17 Carrie Miller 22 y.o. female is here because of newly diagnosed left breast cancer. She was referred by Dr. Barry Dienes. She presents with her brother and his wife; she speaks minimal [place], her family interpreted for her as she refused interpreter services. She initially palpated a left breast mass 6 weeks ago that was painful with breast swelling and felt to be enlarging. Has not had prior mammogram. Denies nipple inversion, discharge, or skin dimpling. She went to [facility] urgent care and was then referred to the breast center for imaging and diagnostics. Diagnostic mammogram showed a dominant mass in the left breast at the 4 o'clock axis, 8 cm from the nipple, measuring 5.2 x 4.3 x 4.3 cm; a satellite mass in the left breast at the 2 o'clock axis, 8 cm from the nipple, measuring 1.6 x 1.3 x 1.3 cm; and a circumscribed hypoechoic mass in the left breast at the 2 o'clock axis, 10 cm  from the nipple, axillary tail measuring 3 x 2.3 x 2.8 cm, suspected lymph node completely replaced by tumor.  Ultrasound-guided biopsy of all 3 masses were positive for invasive ductal carcinoma, ER/PR negative, HER-2 negative, grade 3.    In addition she reports 1 month history of frequent daily headaches with associated dizziness and occasional left eye pain. Pain often wakes her up from sleep, has tried ibuprofen with some relief. Pain usually at the crown of her head, average 5/10 - 9/10 on pain scale. Not sensitive to light or sound. Denies fall. Reports fatigue for 1 week, denies weight loss, decreased appetite, abdominal pain. Over last 2 months she has irregular vaginal bleeding, bleeding 20 days out of the last month. Recently had abnormal PAP smear, colposcopy is pending. She feels very anxious about her diagnosis.  She has no significant past medical history. Her father has prostate cancer diagnosed at age 57. Paternal aunt had "abdominal cancer." a paternal uncle also had prostate cancer diagnosed age 15. Negative family history of breast or GYN cancer. She lives with her family in a home with her parents, brother, and his brother's wife. She does not work or drive. She lived in [place] and moved to [place] 9 months ago. She has permanent resident card, no health insurance. Has been getting assistance with BCCCP program through [employer].  GYN HISTORY  Menarchal: 7 LMP: currently menstruating  Contraceptive: none HRT: none GP: G0    CURRENT THERAPY:   adjuvant radiation started 12/10/2017  INTERVAL HISTORY:   Carrie Miller is here for a follow up. She started radiation with Dr. Isidore Moos on 12/10/2017. She went to the ER on 10/29/2017 complaining of left breast pain and drainage in her JP drain and was expected to see Dr. Barry Dienes to remove the drain. Today, she is here with her family members. She is finishing radiation tomorrow. She has some burning and peeling at  radiation site. She states that these symptoms are improving. Her appetite and energy are improving. Her hair is growing back.  She reports having amenorrhea.    MEDICAL HISTORY:  Past Medical History:  Diagnosis Date  . Breast cancer ([place]) left  . Family history of cancer   . Genetic testing 04/29/2017   Common Cancers panel (47 genes) @ [facility] - No pathoagenic mutations detected    SURGICAL HISTORY: Past Surgical History:  Procedure Laterality Date  . BREAST LUMPECTOMY WITH RADIOACTIVE SEED AND AXILLARY LYMPH NODE DISSECTION Left 10/14/2017   Procedure: LEFT BREAST LUMPECTOMY X 2 WITH RADIOACTIVE SEED X  2 AND LEFT AXILLARY LYMPH NODE DISSECTION;  Surgeon: [name], MD;  Location: [place];  Service: General;  Laterality: Left;  . IR FLUORO GUIDE PORT INSERTION RIGHT  05/03/2017  . IR US GUIDE VASC ACCESS RIGHT  05/03/2017  . PORT-A-CATH REMOVAL N/A 10/14/2017   Procedure: REMOVAL PORT-A-CATH;  Surgeon: [name], MD;  Location: [place];  Service: General;  Laterality: N/A;    SOCIAL HISTORY: Social History   Socioeconomic History  . Marital status: Single    Spouse name: Not on file  . Number of children: Not on file  . Years of education: Not on file  . Highest education level: Not on file  Occupational History  . 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 Financial resource strain: Not on file  . Food insecurity:    Worry: Not on file    Inability: Not on file  . Transportation needs:    Medical: No    Non-medical: No  Tobacco Use  . Smoking status: Never Smoker  . Smokeless tobacco: Never Used  Substance and Sexual Activity  . Alcohol use: No    Frequency: Never  . Drug use: No  . Sexual activity: Never    Birth control/protection: None  Lifestyle  . Physical activity:    Days per week: 3 days    Minutes per session: 30 min  . Stress: Not at all  Relationships  . Social connections:    Talks on phone: More than three times a week    Gets together:  More than three times a week    Attends religious service: 1 to 4 times per year    Active member of club or organization: No    Attends meetings of clubs or organizations: Never    Relationship status: Never married  . Intimate partner violence:    Fear of current or ex partner: No    Emotionally abused: No    Physically abused: No    Forced sexual activity: No  Other Topics Concern  . Not on file  Social History Narrative  . Not on file    FAMILY HISTORY: Family History  Problem Relation Age of Onset  . Hyperlipidemia Mother   . Hypertension Mother   . Prostate cancer Father 27  currently 70  . Stomach cancer Paternal Aunt 20       deceased 76s  . Prostate cancer Paternal Uncle 34       deceased 72    ALLERGIES:  is allergic to heparin.  MEDICATIONS:  Current Outpatient Medications  Medication Sig Dispense Refill  . acetaminophen (TYLENOL) 325 MG tablet Take 2 tablets (650 mg total) by mouth every 4 (four) hours as needed. 50 tablet 2  . ALPRAZolam (XANAX) 0.25 MG tablet Take 1 tablet (0.25 mg total) by mouth at bedtime as needed for anxiety. 30 tablet 0  . amoxicillin (AMOXIL) 500 MG tablet Take 500 mg by mouth 2 (two) times daily.    . Cyanocobalamin (VITAMIN B-12 PO) Take 1 tablet by mouth daily.    . DULoxetine (CYMBALTA) 30 MG capsule Take 1 capsule (30 mg total) by mouth daily. 30 capsule 0  . LORazepam (ATIVAN) 0.5 MG tablet Take 1 tablet (0.5 mg total) by mouth every 8 (eight) hours as needed (nausea, vomiting). 20 tablet 0  . Multiple Vitamins-Minerals (BIOTIN PLUS/CALCIUM/VIT D3 PO) Take 1 tablet by mouth daily.    Marland Kitchen oxyCODONE (OXY IR/ROXICODONE) 5 MG immediate release tablet Take 1 tablet (5 mg total) by mouth every 6 (six) hours as needed for severe pain. 10 tablet 0  . oxyCODONE-acetaminophen (PERCOCET/ROXICET) 5-325 MG tablet Take 1-2 tablets by mouth every 8 (eight) hours as needed for severe pain. 8 tablet 0  . senna (SENOKOT) 8.6 MG TABS tablet Take  1-2 tablets (8.6-17.2 mg total) by mouth at bedtime as needed for mild constipation or moderate constipation. 40 each 0  . traMADol (ULTRAM) 50 MG tablet Take 1-2 tablets (50-100 mg total) by mouth every 6 (six) hours as needed for up to 14 days for severe pain. 90 tablet 0   No current facility-administered medications for this visit.     REVIEW OF SYSTEMS:   Constitutional: Denies fevers, chills or abnormal night sweats    Eyes: Denies blurriness of vision, double vision or watery eyes Ears, nose, mouth, throat, and face: Denies mucositis or sore throat   Respiratory: Denies cough, dyspnea or wheezes Cardiovascular: Denies palpitation, chest discomfort or lower extremity swelling   Gastrointestinal:  Denies heartburn or change in bowel habits   Skin: Denies abnormal skin rashes (+) skin burning and peeling at site of radiation Lymphatics: Denies new lymphadenopathy or easy bruising Neurological: No new numbness or weakness Behavioral/Psych: Mood is stable, no new changes OBGYN: (+) amenorrhea   Breast: (+) burning and peeling at site of radiation All other systems were reviewed with the patient and are negative.  PHYSICAL EXAMINATION:  ECOG PERFORMANCE STATUS: 1 - Symptomatic but completely ambulatory  Vitals:   01/26/18 1042  BP: 100/74  Pulse: 74  Resp: 18  Temp: 98.2 F (36.8 C)  SpO2: 100%   Filed Weights   01/26/18 1042  Weight: 201 lb 12.8 oz (91.5 kg)    GENERAL:alert, no distress and comfortable SKIN: skin color, texture, turgor are normal, no rashes or significant lesions EYES: normal, conjunctiva are pink and non-injected, sclera clear OROPHARYNX:no exudate, no erythema and lips, buccal mucosa, and tongue normal  NECK: supple, thyroid normal size, non-tender, without nodularity LYMPH:  no palpable lymphadenopathy in the cervical, axillary or inguinal LUNGS: clear to auscultation and percussion with normal breathing effort HEART: regular rate & rhythm and no  murmurs and no lower extremity edema ABDOMEN:abdomen soft, non-tender and normal bowel sounds Musculoskeletal:no cyanosis of digits and no clubbing  PSYCH: alert & oriented x 3 with fluent speech NEURO: no focal motor/sensory deficits BREAST: Status post left breast lumpectomy and axillary lymph node dissection, surgical scars have healed very well, (+) diffuse skin erythema and hyperpigmentation from radiation, with mild skin peeling at the bottom of left breast, no palpable breast mass or axillary nodes   LABORATORY DATA:  I have reviewed the data as listed CBC Latest Ref Rng & Units 01/26/2018 10/29/2017 10/15/2017  WBC 4.0 - 10.5 K/uL 3.7(L) 4.5 10.7(H)  Hemoglobin 12.0 - 15.0 g/dL 12.9 10.7(L) 10.3(L)  Hematocrit 36.0 - 46.0 % 37.9 31.4(L) 31.3(L)  Platelets 150 - 400 K/uL 252 328 226    CMP Latest Ref Rng & Units 01/26/2018 10/29/2017 10/15/2017  Glucose 70 - 99 mg/dL 91 104(H) 150(H)  BUN 6 - 20 mg/dL _0 Creatinine 0.44 - 1.00 mg/dL 0.79 0.66 0.60  Sodium 135 - 145 mmol/L 141 144 139  Potassium 3.5 - 5.1 mmol/L 4.0 3.5 3.9  Chloride 98 - 111 mmol/L 106 107 107  CO2 22 - 32 mmol/L _1 Calcium 8.9 - 10.3 mg/dL 9.8 9.3 9.3  Total Protein 6.5 - 8.1 g/dL 7.6 - -  Total Bilirubin 0.3 - 1.2 mg/dL 0.8 - -  Alkaline Phos 38 - 126 U/L 102 - -  AST 15 - 41 U/L 27 - -  ALT 0 - 44 U/L 25 - -   Tumor Markers CA 27.29 Results for Carrie, Miller (MRN [id number]) as of 01/19/2018 17:41  Ref. Range 07/08/2017 09:14 07/15/2017 08:57 07/29/2017 09:09 08/26/2017 11:55 09/29/2017 09:14  CA 27.29 Latest Ref Range: 0.0 - 38.6 U/mL 57.6 (H) 48.5 (H) 35.0 18.3 19.4    PATHOLOGY  Diagnosis 10/14/2017 1. Breast, lumpectomy, Left Upper Outer Quadrant - COMPLETE THERAPEUTIC RESPONSE - NO RESIDUAL CARCINOMA. - BIOPSY SITE AND THERAPY- RELATED CHANGES. - SEE ONCOLOGY TABLE. 2. Breast, lumpectomy, Left Lower Outer Quadrant w/seed - COMPLETE THERAPEUTIC RESPONSE - NO RESIDUAL CARCINOMA. -  BIOPSY SITE AND THERAPY- RELATED CHANGES. - SEE ONCOLOGY TABLE. 3. Lymph nodes, regional resection, Left Axillary - SEVENTEEN LYMPH NODES, NEGATIVE FOR CARCINOMA (0/17). Microscopic Comment 1. BREAST, STATUS POST NEOADJUVANT TREATMENT Procedure: Lumpectomy Laterality: Left Tumor Size: No residual carcinoma Histologic Type: Invasive ductal carcinoma (based on patient's previous biopsy) Grade: Not applicable Ductal Carcinoma in Situ (DCIS): Not identified Regional Lymph Nodes: Number of Lymph Nodes Examined: 17 Number of Sentinel Lymph Nodes Examined: 0 Lymph Nodes with Macrometastases: 0 Lymph Nodes with Micrometastases: 0 Lymph Nodes with Isolated Tumor Cells: 0 Margins: Not applicable - no residual carcinoma Extent of Tumor: Not applicable Breast Prognostic Profile (pre-neoadjuvant case #: [id number]) Estrogen Receptor: 0%, negative Progesterone Receptor: 0%, negative Her2: Negative Ki-67: 80% 1 of 3 FINAL for Carrie Miller, Carrie Miller ([id number]) Microscopic Comment(continued) Will not be performed on the current case as there is no residual carcinoma. Residual Cancer Burden (RCB): Not applicable - no residual carcinoma Treatment effect: No residual carcinoma. No lymph node metastasis. Fibrous scarring, possibly related to prior lymph node metastasis with pathologic complete response. Pathologic Stage Classification (p TNM, AJCC 8th Edition): Primary Tumor (ypT): ypT0 Regional Lymph Nodes (ypN): ypN0 (NK:ecj 10/18/2017) Specimen Gross and Clinical Information Specimen(s) Obtained: 1. Breast, lumpectomy, Left Upper Outer Quadrant 2. Breast, lumpectomy, Left Lower Outer Quadrant w/seed 3. Lymph nodes, regional resection, Left Axillary Specimen Clinical Information 1. Left breast cancer (nt) Gross 1. Specimen type: Left breast lumpectomy, upper outer quadrant, in formalin at 1408 hours. Size: 4.7 x  4.3 cm, and ranges from 1.1 to 2 cm thick. Orientation: Green anterior,  blue inferior, orange lateral, yellow medial, black posterior, red superior. Localized area: There are two inserted pins, one green and one yellow. The green localizes a radioactive seed, and the yellow a clip. Cut surface: Found at the green pin is a radioactive seed which is sent to nuclear medicine. Found at the yellow pin is a 1.2 x 1.1 x 0.8 cm area of yellow-red, indurated and vaguely nodular tissue, which contains a clip. A discrete mass is not identified. Margins: The tissue containing the clip abuts portions of posterior margin, is 0.4 cm from superior margin, and is 1 cm or greater from remaining margins. Prognostic indicators: Not applicable. Block summary: A - D = tissue at and around clip, including nearest posterior, superior and lateral margins. E = anterior margin nearest tissue with clip. F = inferior margin nearest tissue with clip. G = medial margin nearest tissue with clip. Total, seven blocks. 2. Specimen type: Left breast lumpectomy, lower outer quadrant, in formalin at 1439 hours. Size: 6.5 x 4 cm and ranges from 1.5 to 2.2 cm thick. Orientation: Green anterior, blue inferior, orange lateral, yellow medial, black posterior, red superior. Localized area: there are two inserted pins, one green and one yellow. The green localizes a radioactive seed, and the yellow a clip. Cut surface: Found at the green pin is a radioactive seed which is sent to nuclear medicine. Found at the yellow pin is a 2.4 x 2 x 1.3 cm area of tan-yellow to dark red indurated and vaguely nodular tissue, which contains a clip. A discrete mass is not identified. Margins: The tissue containing the clip abuts portions of anterior, inferior, and posterior margins. Prognostic indicators: Not applicable. Block summary: A - E = tissue containing clip, and nearest anterior margin. F, G = tissue containing clip and nearest posterior margin. H, I = tissue containing clip and nearest inferior margin. J =  superior margin nearest tissue with clip. K = medial margin nearest tissue with clip. L = lateral margin nearest tissue with clip. total, twelve blocks. 3. Received fresh is a 10.2 x 5.2 x 3 cm portion of fat within which are found seventeen possible lymph nodes ranging from 0.2 to 1.9 cm, which are entirely submitted as follows: A = four nodes. B = three nodes. C = two nodes bisected. D = two nodes bisected. E = one node bisected. F = one node bisected. G = one node bisected. H = one node bisected. I, J = one node. K, L = largest node. Total, twelve blocks. (SSW:ecj 10/15/2017)  Diagnosis 05/10/17 1. Cervix, biopsy, 11 o'clock - ATYPICAL SQUAMOUS METAPLASIA ASSOCIATED WITH INFLAMMATION. - SEE COMMENT. 2. Endocervix, curettage - DETACHED FRAGMENTS OF SQUAMOUS MUCOSA WITH SLIGHT ATYPIA. - BENIGN ENDOCERVICAL MUCOSA. - SEE COMMENT. Microscopic Comment 1. The sections show multiple fragments of mostly metaplastic squamous mucosa displaying prominent chronic and acute inflammation to a lesser extent. This is associated with epithelial atypia in the form of nuclear enlargement, and small nucleoli. The inflammatory process obscures the epithelium which is somewhat difficult to evaluate. A p16 stain was performed and is negative. Overall, definitive or diagnostic squamous intraepithelial lesion is not appreciated in this setting, and the changes may represent a florid reactive state. Nonetheless, clinical correlation and follow up is strongly recommended. 2. The sections show multiple detached fragments of squamous mucosa, some of which display slight atypia. This is admixed with multiple fragments of benign  endocervical mucosa. A p16 stain was performed and is negative. The overall changes are not specific or diagnostic of a squamous intraepithelial lesion. Clinical correlation and follow up is recommended. (BNS:ah 05/12/17)  GYNECOLOGIC CYTOLOGY REPORT Adequacy Reason  05/10/17 Satisfactory for evaluation, endocervical/transformation zone component PRESENT. Diagnosis NEGATIVE FOR INTRAEPITHELIAL LESIONS OR MALIGNANCY. BENIGN REACTIVE/REPARATIVE CHANGES.  Diagnosis 04/19/17 1. Breast, left, needle core biopsy, 4:00 o'clock, ribbon clip - INVASIVE DUCTAL CARCINOMA. - SEE COMMENT. 2. Breast, left, needle core biopsy, 2:00 o'clock, coil clip - INVASIVE DUCTAL CARCINOMA. - SEE COMMENT. 3. Lymph node, needle/core biopsy, left axilla, spiral hydromark - DUCTAL CARCINOMA. - SEE COMMENT. Microscopic Comment 1. The carcinoma in the three specimens is morphologically similar and is grade III. Lymph nodal tissue is not definitively identified in specimen #3. A breast prognostic profile will be performed on part 2 and the results reported separately. 2. FLUORESCENCE IN-SITU HYBRIDIZATION Results: HER2 - NEGATIVE RATIO OF HER2/CEP17 SIGNALS 1.53 AVERAGE HER2 COPY NUMBER PER CELL 2.75 2. PROGNOSTIC INDICATORS Results: IMMUNOHISTOCHEMICAL AND MORPHOMETRIC ANALYSIS PERFORMED MANUALLY Estrogen Receptor: 0%, NEGATIVE Progesterone Receptor: 0%, NEGATIVE Proliferation Marker Ki67: 80% COMMENT: The negative hormone receptor study(ies) in this case has An internal positive control.    RADIOGRAPHIC STUDIES:  10/06/2017 MR Abdomen W WO Contrast IMPRESSION: 1. No evidence of metastatic breast cancer. 2. The hepatic lesion of concern on prior CT is a simple cyst. There is an additional probable simple cyst as well as a small hemangioma in the left hepatic lobe. 3. Hepatic steatosis and L2 osseous hemangioma also noted.   I have personally reviewed the radiological images as listed and agreed with the findings in the report. No results found.  ASSESSMENT & PLAN:  Carrie Miller a 22 y.o. 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  -She presented with a palpable left breast mass, measures 5.2 cm on ultrasound, with positive lymph node. Breast tumor biopsy showed triple negative breast ductal carcinoma. -Her staging CT and bone scan was negative for distant metastasis, except a small lesion in liver likely a cyst  -Genetic testing was negative for pathogenic mutation. 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 She received monthly Zoladex injections for the duration of chemotherapy to suppress ovarian function -She completed neoadjuvant chemo with ACX4 followed by weekly Norma Fredrickson and Taxol for 11 weeks (last cycle cancelled due to neuropathy) -She underwent double lumpectomy and left axillary lymph node dissection by Dr. Barry Dienes on October 14, 2017, I reviewed her pathology results, she has had complete pathological response, no residual malignant cells in breast and lymph nodes -She does not need any adjuvant chemotherapy -she is finishing adjuvant breast and axillary radiation, tolerating well overall  -We discussed breast cancer surveillance.  Her complete pathological response is a very good prognostic factor, however she still at moderate risk for recurrence given her triple negative disease and initial locally advanced to stage.  I recommend close follow-up every 3 to 4 months for the first 2 to 3 years, then every 6 months for a total of 5 years. -She will continue annual diagnostic mammogram and breast MRI for  breast cancer screening -Encouraged her to eat healthy and exercise, and try to loose some weight  -Port was removed during her breast surgery. -She is recovering  well from surgery and will finish radiation tomorrow. -Labs reviewed, Cbc is overall WNLs. CMP and Ca 27.29 are pending. -I educated her about warning symptoms including new masses, weight loss, or appetite loss. She knows to call if she has any concerns.  -She is amenorrheic and is worried about not having her cycles back. I advised her to wait as menses may return later. I advised her to see fertility clinic if her menses do not return within 6 months or if she is still concerned about fertility. I discussed that she has a good chance to have children. She is not married, but is engaged.  -We previously discussed ovarian preservation, but she couldn't do it before chemotherapy.  -she is going back to [place], will see her back before she leaves   2. Peripheral neuropathy, G1, secondary to chemotherapy  -She developed mild intermittent numbness to fingertips after cycle 3 AC. Function is not limited.  No sensory deficits.   -Her neuropathy has progressed on Taxol/Abraxane, she also developed pain on her lower extremities.   -She was on Tylenol 3 and Cymbalta -Neuropathy has much improved overall after she completed chemo, she is off Tylenol 3  3.  Headaches, dizziness   -When she was diagnosed with breast cancer, she has 1 month history of daily headaches with associated dizziness, pain ranges from 5/10 - 9/10 on the crown of her head, ibuprofen helps some; she can continue this or take tylenol PRN, neuro exam was unremarkable -Given the aggressive nature of triple negative disease, I obtained brain MRI on 05/05/17 which showed no evidence of brain mets.  -resolved now after she completed chemo    4.  Anxiety  -She has considerable anxiety about her new diagnosis and treatment plan  -I previously prescribed low dose xanax for her to take PRN for anxiety especially at night if she has difficulty sleeping -Stable   5. Genetics  -Due to her young age and family history of  prostate cancer, she has been referred to genetics -05/11/17 Genetic testing was negative for pathogenic mutation  6. Small liver lesion -Her initial staging CT scan showed a 1 cm hypoattenuated lesion in the posterior dome of the liver, likely a complicated liver cyst, hemangioma, although metastatic disease is not ruled out. -We did a abdominal MRI with and without contrast before her breast surgery which showed a simple cyst and hepatic steatosis  PLAN:   -Lab and f/u on 11/25, before she returns to her [place]     No orders of the defined types were placed in this encounter.     All questions were answered. The patient knows to call the clinic with any problems, questions or concerns. I spent 20 minutes counseling the patient face to face. The total time spent in the appointment was 25 minutes and more than 50% was on counseling.   Dierdre Searles Dweik am acting as scribe for Dr. [name].  I have reviewed the above documentation for accuracy and completeness, and I agree with the above.    [name], MD 01/26/2018

## 2018-01-20 ENCOUNTER — Ambulatory Visit
Admission: RE | Admit: 2018-01-20 | Discharge: 2018-01-20 | Disposition: A | Discharge status: Home / Self Care | Payer: Medicaid Other | Source: Ambulatory Visit | Attending: Radiation Oncology | Admitting: Radiation Oncology

## 2018-01-20 ENCOUNTER — Ambulatory Visit: Payer: Medicaid Other

## 2018-01-20 DIAGNOSIS — Z510 Encounter for antineoplastic radiation therapy: Secondary | ICD-10-CM | POA: Diagnosis not present

## 2018-01-21 ENCOUNTER — Ambulatory Visit: Payer: Medicaid Other

## 2018-01-21 ENCOUNTER — Ambulatory Visit
Admission: RE | Admit: 2018-01-21 | Discharge: 2018-01-21 | Disposition: A | Discharge status: Home / Self Care | Payer: Medicaid Other | Source: Ambulatory Visit | Attending: Radiation Oncology | Admitting: Radiation Oncology

## 2018-01-21 DIAGNOSIS — Z510 Encounter for antineoplastic radiation therapy: Secondary | ICD-10-CM | POA: Diagnosis not present

## 2018-01-24 ENCOUNTER — Ambulatory Visit: Payer: Medicaid Other

## 2018-01-24 ENCOUNTER — Ambulatory Visit
Admission: RE | Admit: 2018-01-24 | Discharge: 2018-01-24 | Disposition: A | Discharge status: Home / Self Care | Payer: Medicaid Other | Source: Ambulatory Visit | Attending: Radiation Oncology | Admitting: Radiation Oncology

## 2018-01-24 DIAGNOSIS — Z510 Encounter for antineoplastic radiation therapy: Secondary | ICD-10-CM | POA: Diagnosis not present

## 2018-01-25 ENCOUNTER — Ambulatory Visit
Admission: RE | Admit: 2018-01-25 | Discharge: 2018-01-25 | Disposition: A | Discharge status: Home / Self Care | Payer: Medicaid Other | Source: Ambulatory Visit | Attending: Radiation Oncology | Admitting: Radiation Oncology

## 2018-01-25 DIAGNOSIS — Z510 Encounter for antineoplastic radiation therapy: Secondary | ICD-10-CM | POA: Diagnosis not present

## 2018-01-26 ENCOUNTER — Ambulatory Visit: Payer: Medicaid Other

## 2018-01-26 ENCOUNTER — Ambulatory Visit
Admission: RE | Admit: 2018-01-26 | Discharge: 2018-01-26 | Disposition: A | Discharge status: Home / Self Care | Payer: Medicaid Other | Source: Ambulatory Visit | Attending: Radiation Oncology | Admitting: Radiation Oncology

## 2018-01-26 ENCOUNTER — Inpatient Hospital Stay (HOSPITAL_BASED_OUTPATIENT_CLINIC_OR_DEPARTMENT_OTHER): Payer: Medicaid Other | Admitting: Hematology

## 2018-01-26 ENCOUNTER — Encounter: Payer: Self-pay | Admitting: Hematology

## 2018-01-26 ENCOUNTER — Inpatient Hospital Stay: Payer: Medicaid Other | Attending: Nurse Practitioner

## 2018-01-26 ENCOUNTER — Telehealth: Payer: Self-pay | Admitting: Hematology

## 2018-01-26 VITALS — BP 100/74 | HR 74 | Temp 98.2°F | Resp 18 | Ht 64.0 in | Wt 201.8 lb

## 2018-01-26 DIAGNOSIS — C50812 Malignant neoplasm of overlapping sites of left female breast: Secondary | ICD-10-CM

## 2018-01-26 DIAGNOSIS — Z23 Encounter for immunization: Secondary | ICD-10-CM

## 2018-01-26 DIAGNOSIS — K769 Liver disease, unspecified: Secondary | ICD-10-CM | POA: Diagnosis not present

## 2018-01-26 DIAGNOSIS — F419 Anxiety disorder, unspecified: Secondary | ICD-10-CM

## 2018-01-26 DIAGNOSIS — Z8042 Family history of malignant neoplasm of prostate: Secondary | ICD-10-CM

## 2018-01-26 DIAGNOSIS — Z171 Estrogen receptor negative status [ER-]: Secondary | ICD-10-CM

## 2018-01-26 DIAGNOSIS — G620 Drug-induced polyneuropathy: Secondary | ICD-10-CM

## 2018-01-26 DIAGNOSIS — R51 Headache: Secondary | ICD-10-CM

## 2018-01-26 DIAGNOSIS — T451X5A Adverse effect of antineoplastic and immunosuppressive drugs, initial encounter: Secondary | ICD-10-CM

## 2018-01-26 DIAGNOSIS — Z510 Encounter for antineoplastic radiation therapy: Secondary | ICD-10-CM | POA: Diagnosis not present

## 2018-01-26 DIAGNOSIS — R42 Dizziness and giddiness: Secondary | ICD-10-CM

## 2018-01-26 LAB — CBC WITH DIFFERENTIAL (CANCER CENTER ONLY)
ABS IMMATURE GRANULOCYTES: 0.01 10*3/uL (ref 0.00–0.07)
BASOS ABS: 0 10*3/uL (ref 0.0–0.1)
Basophils Relative: 0 %
Eosinophils Absolute: 0.1 10*3/uL (ref 0.0–0.5)
Eosinophils Relative: 3 %
HEMATOCRIT: 37.9 % (ref 36.0–46.0)
HEMOGLOBIN: 12.9 g/dL (ref 12.0–15.0)
IMMATURE GRANULOCYTES: 0 %
LYMPHS ABS: 0.6 10*3/uL — AB (ref 0.7–4.0)
LYMPHS PCT: 17 %
MCH: 30.6 pg (ref 26.0–34.0)
MCHC: 34 g/dL (ref 30.0–36.0)
MCV: 89.8 fL (ref 80.0–100.0)
MONOS PCT: 11 %
Monocytes Absolute: 0.4 10*3/uL (ref 0.1–1.0)
NEUTROS PCT: 69 %
Neutro Abs: 2.6 10*3/uL (ref 1.7–7.7)
Platelet Count: 252 10*3/uL (ref 150–400)
RBC: 4.22 MIL/uL (ref 3.87–5.11)
RDW: 11.8 % (ref 11.5–15.5)
WBC Count: 3.7 10*3/uL — ABNORMAL LOW (ref 4.0–10.5)
nRBC: 0 % (ref 0.0–0.2)

## 2018-01-26 LAB — CMP (CANCER CENTER ONLY)
ALBUMIN: 3.8 g/dL (ref 3.5–5.0)
ALT: 25 U/L (ref 0–44)
AST: 27 U/L (ref 15–41)
Alkaline Phosphatase: 102 U/L (ref 38–126)
Anion gap: 9 (ref 5–15)
BUN: 7 mg/dL (ref 6–20)
CHLORIDE: 106 mmol/L (ref 98–111)
CO2: 26 mmol/L (ref 22–32)
Calcium: 9.8 mg/dL (ref 8.9–10.3)
Creatinine: 0.79 mg/dL (ref 0.44–1.00)
GFR, Est AFR Am: >60 mL/min (ref >60)
GFR, Estimated: >60 mL/min (ref >60)
GLUCOSE: 91 mg/dL (ref 70–99)
Potassium: 4 mmol/L (ref 3.5–5.1)
SODIUM: 141 mmol/L (ref 135–145)
Total Bilirubin: 0.8 mg/dL (ref 0.3–1.2)
Total Protein: 7.6 g/dL (ref 6.5–8.1)

## 2018-01-26 MED ORDER — INFLUENZA VAC SPLIT QUAD 0.5 ML IM SUSY
0.5000 mL | PREFILLED_SYRINGE | Freq: Once | INTRAMUSCULAR | Status: AC
  Administered 2018-01-26: 0.5 mL via INTRAMUSCULAR

## 2018-01-26 MED ORDER — INFLUENZA VAC SPLIT QUAD 0.5 ML IM SUSY
PREFILLED_SYRINGE | INTRAMUSCULAR | Status: AC
  Filled 2018-01-26: qty 0.5

## 2018-01-26 NOTE — Patient Instructions (Signed)
Preventing Influenza, Adult Influenza, more commonly known as "the flu," is a viral infection that mainly affects the respiratory tract. The respiratory tract includes structures that help you breathe, such as the lungs, nose, and throat. The flu causes many common cold symptoms, as well as a high fever and body aches. The flu spreads easily from person to person (is contagious). The flu is most common from December through March. This is called flu season.You can catch the flu virus by:  Breathing in droplets from an infected person's cough or sneeze.  Touching something that was recently contaminated with the virus and then touching your mouth, nose, or eyes.  What can I do to lower my risk? You can decrease your risk of getting the flu by:  Getting a flu shot (influenza vaccination) every year. This is the best way to prevent the flu. A flu shot is recommended for everyone age 6 months and older. ? It is best to get a flu shot in the fall, as soon as it is available. Getting a flu shot during winter or spring instead is still a good idea. Flu season can last into early spring. ? Preventing the flu through vaccination requires getting a new flu shot every year. This is because the flu virus changes slightly (mutates) from one year to the next. Even if a flu shot does not completely protect you from all flu virus mutations, it can reduce the severity of your illness and prevent dangerous complications of the flu. ? If you are pregnant, you can and should get a flu shot. ? If you have had a reaction to the shot in the past or if you are allergic to eggs, check with your health care provider before getting a flu shot. ? Sometimes the vaccine is available as a nasal spray. In some years, the nasal spray has not been as effective against the flu virus. Check with your health care provider if you have questions about this.  Practicing good health habits. This is especially important during flu  season. ? Avoid contact with people who are sick with flu or cold symptoms. ? Wash your hands with soap and water often. If soap and water are not available, use hand sanitizer. ? Avoid touching your hands to your face, especially when you have not washed your hands recently. ? Use a disinfectant to clean surfaces at home and at work that may be contaminated with the flu virus. ? Keep your body's disease-fighting system (immune system) in good shape by eating a healthy diet, drinking plenty of fluids, getting enough sleep, and exercising regularly.  If you do get the flu, avoid spreading it to others by:  Staying home until your symptoms have been gone for at least one day.  Covering your mouth and nose with your elbow when you cough or sneeze.  Avoiding close contact with others, especially babies and elderly people.  Why are these changes important? Getting a flu shot and practicing good health habits protects you as well as other people. If you get the flu, your friends, family, and co-workers are also at risk of getting it, because it spreads so easily to others. Each year, about 2 out of every 10 people get the flu. Having the flu can lead to complications, such as pneumonia, ear infection, and sinus infection. The flu also can be deadly, especially for babies, people older than age 65, and people who have serious long-term diseases. How is this treated? Most people recover   from the flu by resting at home and drinking plenty of fluids. However, a prescription antiviral medicine may reduce your flu symptoms and may make your flu go away sooner. This medicine must be started within a few days of getting flu symptoms. You can talk with your health care provider about whether you need an antiviral medicine. Antiviral medicine may be prescribed for people who are at risk for more serious flu symptoms. This includes people who:  Are older than age 65.  Are pregnant.  Have a condition that  makes the flu worse or more dangerous.  Where to find more information:  Centers for Disease Control and Prevention: www.cdc.gov/flu/index.htm  Flu.gov: www.flu.gov/prevention-vaccination  American Academy [employer]: familydoctor.org/familydoctor/en/kids/vaccines/preventing-the-flu.html Contact a health care provider if:  You have influenza and you develop new symptoms.  You have: ? Chest pain. ? Diarrhea. ? A fever.  Your cough gets worse, or you produce more mucus. Summary  The best way to prevent the flu is to get a flu shot every year in the fall.  Even if you get the flu after you have received the yearly vaccine, your flu may be milder and go away sooner because of your flu shot.  If you get the flu, antiviral medicines that are started with a few days of symptoms may reduce your flu symptoms and may make your flu go away sooner.  You can also help prevent the flu by practicing good health habits. This information is not intended to replace advice given to you by your health care provider. Make sure you discuss any questions you have with your health care provider. Document Released: 04/21/2015 Document Revised: 12/14/2015 Document Reviewed: 12/14/2015 Elsevier Interactive Patient Education  2018 [employer]  

## 2018-01-26 NOTE — Telephone Encounter (Signed)
Scheduled appt per 10/9 los - gave patient aVS and calender per los.

## 2018-01-27 ENCOUNTER — Encounter: Payer: Self-pay | Admitting: Radiation Oncology

## 2018-01-27 ENCOUNTER — Ambulatory Visit
Admission: RE | Admit: 2018-01-27 | Discharge: 2018-01-27 | Disposition: A | Discharge status: Home / Self Care | Payer: Medicaid Other | Source: Ambulatory Visit | Attending: Radiation Oncology | Admitting: Radiation Oncology

## 2018-01-27 DIAGNOSIS — Z510 Encounter for antineoplastic radiation therapy: Secondary | ICD-10-CM | POA: Diagnosis not present

## 2018-01-27 LAB — CANCER ANTIGEN 27.29: CAN 27.29: 13.7 U/mL (ref 0.0–38.6)

## 2018-02-01 NOTE — Progress Notes (Signed)
  Radiation Oncology         [phone] ________________________________  Name: Carrie Miller MRN: [id number]  Date: 01/27/2018  DOB: 29-Nov-1995  End of Treatment Note  Diagnosis:   22 y.o. female with Stage IIIC (cT3(m), cN1, cM0), ER: negative, PR: negative, Her2: negative,Grade3(Cancer stage was possibly T4d at diagnosis) Cancer Staging Cancer of overlapping sites of left breast [facility]) Staging form: Breast, AJCC 8th Edition - Clinical stage from 04/19/2017: Stage IIIC (cT3(m), cN1, cM0, G3, ER: Negative, PR: Negative, HER2: Negative) - Signed by [name], MD on 04/28/2017 - Pathologic stage from 10/14/2017: No Stage Recommended (ypT0, pN0, cM0, GX, ER: Not Assessed, PR: Not Assessed, HER2: Not Assessed) - Signed by [name], MD on 10/24/2017  Indication for treatment:  Curative       Radiation treatment dates:   12/09/2017 - 01/27/2018  Site/dose:    1. Left Breast and IM nodes / 50 Gy in 25 fractions 2. Left SCV, PAB / 50 Gy in 25 fractions 3. Left Breast Boost / 10 Gy in 5 fractions  Beams/energy:    1. 3D / 6X, 10X Photon 2. 3D / 15X, 10X Photon 3. isodose / 10X, 6X Photon  Narrative: The patient tolerated radiation treatment with difficulty. [name] was initially used over the breast due to the inflammatory nature of cancer at diagnosis. She did experience significant skin irritation as she progressed through treatment. Her treatment was complicated by moist desquamation and severe pain, and her radiotherapy was held from 01/07/18 to 01/17/18. Pros and cons of holding radiation treatments were discussed with the patient at the time. Bolus was held after her 20th fraction. Her skin significantly improved after this break and she continued RT. It now only shows dry desquamation. She knows to continue her skin care routine to promote further healing.  Plan: The patient has completed radiation treatment. The patient will return to radiation oncology clinic for routine  followup in two weeks. I advised them to call or return sooner if they have any questions or concerns related to their recovery or treatment.  -----------------------------------  Eppie Gibson, MD  This document serves as a record of services personally performed by Eppie Gibson, MD. It was created on her behalf by [name], a trained medical scribe. The creation of this record is based on the scribe's personal observations and the provider's statements to them. This document has been checked and approved by the attending provider.

## 2018-02-03 ENCOUNTER — Encounter: Payer: Self-pay | Admitting: Radiation Oncology

## 2018-02-09 ENCOUNTER — Telehealth: Payer: Self-pay

## 2018-02-09 NOTE — Telephone Encounter (Signed)
Patient's brother called stating she is out of town, can't come Friday at 4:00 for appointment with Dr. Isidore Moos. Forwarded scheduling message.

## 2018-02-10 ENCOUNTER — Telehealth: Payer: Self-pay | Admitting: Radiation Oncology

## 2018-02-10 NOTE — Telephone Encounter (Signed)
Received message from Nicholaus Corolla on 02/09/2018 that patient would be out of town and the son was requested to reschedule 02/11/2018 follow up visit with Dr. Isidore Moos to some time in November. Appointment has been scheduled for 03/15/2018 @ 2:40 pm.

## 2018-02-11 ENCOUNTER — Ambulatory Visit
Admission: RE | Admit: 2018-02-11 | Discharge: 2018-02-11 | Disposition: A | Discharge status: Home / Self Care | Payer: Medicaid Other | Source: Ambulatory Visit | Attending: Radiation Oncology | Admitting: Radiation Oncology

## 2018-02-11 HISTORY — DX: Personal history of irradiation: Z92.3

## 2018-03-10 NOTE — Progress Notes (Signed)
Ms. Geigle presents for follow up of radiation completed 01/27/18 to her left breast. She saw Dr. Burr Medico last on 03/14/18. She will see her again in 6 months. She will see survivorship on 05/18/18. She tells me that her skin has healed. She is using vitamin E lotion to her radiation site.   BP 121/70 (BP Location: Right Arm, Patient Position: Sitting)   Pulse 77   Temp 97.6 F (36.4 C) (Oral)   Resp 18   Ht 5\' 6"  (1.676 m)   Wt 201 lb 6 oz (91.3 kg)   SpO2 100%   BMI 32.50 kg/m    Wt Readings from Last 3 Encounters:  03/15/18 201 lb 6 oz (91.3 kg)  03/14/18 202 lb 1.6 oz (91.7 kg)  01/26/18 201 lb 12.8 oz (91.5 kg)

## 2018-03-12 NOTE — Progress Notes (Signed)
[facility]  Telephone:([phone] Fax:(336) [phone]  Clinic Follow up Note   Patient Care Team: Patient, No Pcp Per as PCP - General ([place]) [name], MD as Consulting Physician (General Surgery) [name], MD as Consulting Physician (Hematology) [name], NP as Nurse Practitioner (Nurse Practitioner) 03/14/2018   Chief Complaint: F/u on breast cancer  SUMMARY OF ONCOLOGIC HISTORY: Oncology History   Cancer Staging Cancer of overlapping sites of left breast [facility]) Staging form: Breast, AJCC 8th Edition - Clinical stage from 04/19/2017: Stage IIIC (cT3(m), cN1, cM0, G3, ER: Negative, PR: Negative, HER2: Negative) - Signed by [name], MD on 04/28/2017 - Pathologic stage from 10/14/2017: No Stage Recommended (ypT0, pN0, cM0, GX, ER: Not Assessed, PR: Not Assessed, HER2: Not Assessed) - Signed by [name], MD on 10/24/2017       Cancer of overlapping sites of left breast ([place])   04/15/2017 Mammogram    IMPRESSION: 1. Highly suspicious irregular mass within the left breast at the 4 o'clock axis, 8 cm from the nipple, measuring 5.2 cm, corresponding to the area of clinical concern. Ultrasound-guided biopsy is recommended. 2. Additional suspicious irregular mass within the left breast at the 2 o'clock axis, 8 cm from the nipple, measuring 1.6 cm, corresponding to the mammographic finding. Ultrasound-guided biopsy is recommended. 3. Additional suspicious mass within the left breast at the 2 o'clock axis, 10 cm from the nipple, axillary tail region, measuring 3 cm, suspected lymph node completely replaced by tumor. Ultrasound-guided biopsy is recommended. 4. No evidence of malignancy within the right breast.     04/19/2017 Initial Biopsy    Diagnosis 1. Breast, left, needle core biopsy, 4:00 o'clock, ribbon clip - INVASIVE DUCTAL CARCINOMA. - SEE COMMENT. 2. Breast, left, needle core biopsy, 2:00 o'clock, coil clip - INVASIVE DUCTAL  CARCINOMA. - SEE COMMENT. 3. Lymph node, needle/core biopsy, left axilla, spiral hydromark - DUCTAL CARCINOMA. - SEE COMMENT.  2. PROGNOSTIC INDICATORS Results: IMMUNOHISTOCHEMICAL AND MORPHOMETRIC ANALYSIS PERFORMED MANUALLY Estrogen Receptor: 0%, NEGATIVE Progesterone Receptor: 0%, NEGATIVE Proliferation Marker Ki67: 80%  2. FLUORESCENCE IN-SITU HYBRIDIZATION Results: HER2 - NEGATIVE RATIO OF HER2/CEP17 SIGNALS 1.53 AVERAGE HER2 COPY NUMBER PER CELL 2.75  Microscopic Comment 1. The carcinoma in the three specimens is morphologically similar and is grade III. Lymph nodal tissue is not definitively identified in specimen #3. A breast prognostic profile will be performed on part 2 and the results reported separately.    04/19/2017 Initial Diagnosis    Cancer of overlapping sites of left breast ([place])    04/30/2017 Breast MRI    IMPRESSION: 1. Biopsy-proven invasive carcinoma within the lower outer quadrant of the LEFT breast, 4 o'clock axis, at posterior depth, measuring 4.8 cm, with associated biopsy clip artifact. 2. Biopsy-proven invasive carcinoma within the upper-outer quadrant of the LEFT breast, 2 o'clock axis, at posterior depth, measuring 2.5 cm, with associated biopsy clip artifact. However, contiguous non mass enhancement along the posterior margin of this 2 o'clock mass and extending 3 cm anteriorly from the mass increases the overall measurement to 5.5 cm greatest dimension (AP). 3. Diffuse edema throughout the LEFT breast, with particularly prominent component of edema extending posteriorly to abut the anterior surface of the pectoralis muscle, and diffuse skin thickening throughout the left breast. This almost certainly indicates INFLAMMATORY BREAST CANCER. 4. Biopsy-proven metastatic lymph node within the LEFT axilla measures 4.1 cm. Two additional borderline prominent lymph nodes within the left axilla. No enlarged lymph nodes within the right axilla or  internal mammary chain regions. 5. No evidence of malignancy within the RIGHT breast.  RECOMMENDATION: Per current treatment plan for patient's known left breast cancer (2 biopsy-proven sites) and metastatic lymph node in the left axilla.  BI-RADS CATEGORY  6: Known biopsy-proven malignancy.     04/30/2017 Echocardiogram    Study Conclusions  - Left ventricle: The cavity size was normal. Wall thickness was   normal. Systolic function was normal. The estimated ejection   fraction was in the range of 60% to 65%. Wall motion was normal;   there were no regional wall motion abnormalities. Left   ventricular diastolic function parameters were normal. GLS:   -20.4%, RV S vel 12.1cm/s.    05/05/2017 Imaging    MRI BRAIN IMPRESSION: No intracranial or calvarial metastatic disease. Normal MRI of the brain.    05/07/2017 Imaging    CT CAP IMPRESSION: Two left breast masses and a grossly abnormal 3.9 cm lower left axillary lymph node, likely correspond to multifocal left breast cancer metastatic to the left axilla. Skin thickening of the left breast, raising concern for an inflammatory breast cancer.  1 cm hypoattenuated lesion in the posterior dome of the liver with internal attenuation slightly higher than water. This may represent a complicated liver cyst, hemangioma or a focus of metastatic disease.  No other findings to suggest distal metastatic disease.     05/07/2017 Imaging    BONE SCAN IMPRESSION: 1. No evidence of osseous metastases. 2. Abnormal uptake in the soft tissues of the left breast likely by the patient's known breast cancer.     05/11/2017 Genetic Testing    Common Cancers panel (47 genes) @ [facility] - No pathoagenic mutations detected Five Variants of Uncertain Significance were detected:  APC c.2438A>G (p.Asn813Ser)  BARD1 c.782T>C (p.Leu261Pro) MSH3 c.1655C>T (p.Thr552Ile)  MSH6 c.2108T>C (p.Met703Thr)  NTHL1 c.470G>C (p.Arg157Pro)   Genes  Analyzed: 47 genes on [facility]'s Common Cancers panel (APC, ATM, AXIN2, BARD1, BMPR1A, BRCA1, BRCA2, BRIP1, CDH1, CDK4, CDKN2A, CHEK2, CTNNA1, DICER1, EPCAM, GREM1, HOXB13, KIT, MEN1, MLH1, MSH2, MSH3, MSH6, MUTYH, NBN, NF1, NTHL1, PALB2, PDGFRA, PMS2, POLD1, POLE, PTEN, RAD50, RAD51C, RAD51D, SDHA, SDHB, SDHC, SDHD, SMAD4, SMARCA4, STK11, TP53, TSC1, TSC2, VHL).     05/12/2017 - 09/16/2017 Chemotherapy    1. Neoadjuvant AC q2 weeks x4 cycles then weekly 05/12/17 - 06/24/17 followed by carbo/taxol for 12 weeks starting 07/08/17. Due to poor toleration, changed Taxol to abraxane and added Carboplatin with cycle 2.  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  2. Monthly zoladex injections starting 04/30/17 for the duration of chemotherapy.     08/06/2017 Mammogram    The previously demonstrated rounded mass in the 2 o'clock position of the left breast, containing a coil shaped biopsy marker clip, is also significantly smaller, less well-defined and has interspersed fat. This currently measures 0.7 x 0.5 x 0.4 cm in maximum dimensions, previously 1.8 x 1.5 x 1.3 cm mammographically.  IMPRESSION: 1. No evidence of malignancy at the location of recent palpable concern in the upper outer left breast. 2. Marked decrease in size and conspicuity of the biopsy-proven invasive ductal carcinomas in the 2 o'clock and 4 o'clock positions of the left breast with no discrete residual masses. There is only mild ill-defined residual soft tissue density and interspersed fat at those locations and associated architectural distortion in the 4 o'clock position. 3. Complete imaging resolution of the previously biopsied metastatic left inferior axillary lymph node.    09/23/2017 Imaging    Repeated breast  MRI showed 1. Significant positive imaging response to chemotherapy with resolution of the previously seen biopsy-proven malignancy over the 4 o'clock position of the left breast with only subtle residual non mass enhancement  in this location measuring 2.0 x 0.9 x 1.5 cm. 2. Complete imaging response with resolution of the biopsy-proven malignancy over the 2 o'clock position of the left breast. 3. Complete resolution of the previously seen diffuse left breast edema and skin thickening. 4. Near complete resolution of the previously biopsy-proven metastatic left axillary lymph node.    10/14/2017 Pathology Results     Diagnosis 10/14/2017 1. Breast, lumpectomy, Left Upper Outer Quadrant - COMPLETE THERAPEUTIC RESPONSE - NO RESIDUAL CARCINOMA. - BIOPSY SITE AND THERAPY- RELATED CHANGES. - SEE ONCOLOGY TABLE. 2. Breast, lumpectomy, Left Lower Outer Quadrant w/seed - COMPLETE THERAPEUTIC RESPONSE - NO RESIDUAL CARCINOMA. - BIOPSY SITE AND THERAPY- RELATED CHANGES. - SEE ONCOLOGY TABLE. 3. Lymph nodes, regional resection, Left Axillary - SEVENTEEN LYMPH NODES, NEGATIVE FOR CARCINOMA (0/17).    10/14/2017 Surgery    Left breast double lumpectomy and axillary lymph node dissection by Dr. [name].    12/10/2017 - 01/27/2018 Radiation Therapy    Daily treatment with Dr. Isidore Moos     CURRENT THERAPY: Surveillance  INTERVAL HISTORY: Carrie Miller is a 22 y.o. female who is here for follow-up. She completed radiation with Dr. Isidore Moos on 01/27/2018.  Today, she is here with her brother. She is doing well and states that she cancelled her trip to [place]. She denies residual side effects from chemotherapy. Denies numbness and tingling. She says that she lost her eyelashes. Her appetite and energy are back to normal. She still feels harp shooting pain at surgical site and is 8-9/10 in severity and happened 2-3 times a week. The pain lasts 1-2 seconds. She attended PT after breast surgery. She usually gets headaches every 1-2 weeks, but her headaches are becoming more frequent. Her headaches are worse at night. Her menses are still not back.    Pertinent positives and negatives of review of systems are listed and  detailed within the above HPI.   REVIEW OF SYSTEMS:   Constitutional: Denies fevers, chills or abnormal weight loss (+) headache Eyes: Denies blurriness of vision Ears, nose, mouth, throat, and face: Denies mucositis or sore throat Respiratory: Denies cough, dyspnea or wheezes Cardiovascular: Denies palpitation, chest discomfort or lower extremity swelling Gastrointestinal:  Denies nausea, heartburn or change in bowel habits Skin: Denies abnormal skin rashes (+) losing eyelashes  Lymphatics: Denies new lymphadenopathy or easy bruising Neurological:Denies numbness, tingling or new weaknesses Behavioral/Psych: Mood is stable, no new changes  BREAST: (+) sharp shooting breast pain All other systems were reviewed with the patient and are negative.  MEDICAL HISTORY:  Past Medical History:  Diagnosis Date  . Breast cancer ([place]) left  . Family history of cancer   . Genetic testing 04/29/2017   Common Cancers panel (47 genes) @ [facility] - No pathoagenic mutations detected  . History of radiation therapy 12/09/17- 01/27/18   Left Breast 50 Gy in 25 fractions, Left SCV 50 Gy in 25 fractions, Left breast boost 10 Gy in 5 fractions. She did have a break 9/20-9/30 due to skin breakdown.     SURGICAL HISTORY: Past Surgical History:  Procedure Laterality Date  . BREAST LUMPECTOMY WITH RADIOACTIVE SEED AND AXILLARY LYMPH NODE DISSECTION Left 10/14/2017   Procedure: LEFT BREAST LUMPECTOMY X 2 WITH RADIOACTIVE SEED X  2 AND LEFT AXILLARY LYMPH NODE DISSECTION;  Surgeon:  [name], MD;  Location: [facility];  Service: General;  Laterality: Left;  . IR FLUORO GUIDE PORT INSERTION RIGHT  05/03/2017  . IR US GUIDE VASC ACCESS RIGHT  05/03/2017  . PORT-A-CATH REMOVAL N/A 10/14/2017   Procedure: REMOVAL PORT-A-CATH;  Surgeon: [name], MD;  Location: [facility];  Service: General;  Laterality: N/A;    I have reviewed the social history and family history with the patient and they are unchanged from previous  note.  ALLERGIES:  is allergic to heparin.  MEDICATIONS:  Current Outpatient Medications  Medication Sig Dispense Refill  . acetaminophen (TYLENOL) 325 MG tablet Take 2 tablets (650 mg total) by mouth every 4 (four) hours as needed. 50 tablet 2  . ALPRAZolam (XANAX) 0.25 MG tablet Take 1 tablet (0.25 mg total) by mouth at bedtime as needed for anxiety. 30 tablet 0  . amoxicillin (AMOXIL) 500 MG tablet Take 500 mg by mouth 2 (two) times daily.    . Cyanocobalamin (VITAMIN B-12 PO) Take 1 tablet by mouth daily.    . DULoxetine (CYMBALTA) 30 MG capsule Take 1 capsule (30 mg total) by mouth daily. 30 capsule 0  . LORazepam (ATIVAN) 0.5 MG tablet Take 1 tablet (0.5 mg total) by mouth every 8 (eight) hours as needed (nausea, vomiting). 20 tablet 0  . Multiple Vitamins-Minerals (BIOTIN PLUS/CALCIUM/VIT D3 PO) Take 1 tablet by mouth daily.    Marland Kitchen oxyCODONE (OXY IR/ROXICODONE) 5 MG immediate release tablet Take 1 tablet (5 mg total) by mouth every 6 (six) hours as needed for severe pain. 10 tablet 0  . oxyCODONE-acetaminophen (PERCOCET/ROXICET) 5-325 MG tablet Take 1-2 tablets by mouth every 8 (eight) hours as needed for severe pain. 8 tablet 0  . senna (SENOKOT) 8.6 MG TABS tablet Take 1-2 tablets (8.6-17.2 mg total) by mouth at bedtime as needed for mild constipation or moderate constipation. 40 each 0   No current facility-administered medications for this visit.     PHYSICAL EXAMINATION: ECOG PERFORMANCE STATUS: 0 - Asymptomatic  Vitals:   03/14/18 0956  BP: 111/82  Pulse: 77  Resp: 18  Temp: 97.8 F (36.6 C)  SpO2: 100%   Filed Weights   03/14/18 0956  Weight: 202 lb 1.6 oz (91.7 kg)    GENERAL:alert, no distress and comfortable SKIN: skin color, texture, turgor are normal, no rashes or significant lesions EYES: normal, Conjunctiva are pink and non-injected, sclera clear OROPHARYNX:no exudate, no erythema and lips, buccal mucosa, and tongue normal  NECK: supple, thyroid normal  size, non-tender, without nodularity LYMPH:  no palpable lymphadenopathy in the cervical, axillary or inguinal LUNGS: clear to auscultation and percussion with normal breathing effort HEART: regular rate & rhythm and no murmurs and no lower extremity edema ABDOMEN:abdomen soft, non-tender and normal bowel sounds Musculoskeletal:no cyanosis of digits and no clubbing  NEURO: alert & oriented x 3 with fluent speech, no focal motor/sensory deficits BREAST: (+) left breast hyperpigmentation from radiation with mild tenderness and scar tissue at site of incision. No new palpable masses or nipple changes   LABORATORY DATA:  I have reviewed the data as listed CBC Latest Ref Rng & Units 03/14/2018 01/26/2018 10/29/2017  WBC 4.0 - 10.5 K/uL 4.3 3.7(L) 4.5  Hemoglobin 12.0 - 15.0 g/dL 11.9(L) 12.9 10.7(L)  Hematocrit 36.0 - 46.0 % 35.8(L) 37.9 31.4(L)  Platelets 150 - 400 K/uL 238 252 328     CMP Latest Ref Rng & Units 03/14/2018 01/26/2018 10/29/2017  Glucose 70 - 99 mg/dL 98 91 104(H)  BUN 6 - 20 mg/dL _0 Creatinine 0.44 - 1.00 mg/dL 0.74 0.79 0.66  Sodium 135 - 145 mmol/L 140 141 144  Potassium 3.5 - 5.1 mmol/L 4.0 4.0 3.5  Chloride 98 - 111 mmol/L 107 106 107  CO2 22 - 32 mmol/L _1 Calcium 8.9 - 10.3 mg/dL 9.5 9.8 9.3  Total Protein 6.5 - 8.1 g/dL 7.5 7.6 -  Total Bilirubin 0.3 - 1.2 mg/dL 0.6 0.8 -  Alkaline Phos 38 - 126 U/L 96 102 -  AST 15 - 41 U/L 26 27 -  ALT 0 - 44 U/L 33 25 -      RADIOGRAPHIC STUDIES: I have personally reviewed the radiological images as listed and agreed with the findings in the report. No results found.   ASSESSMENT & PLAN: Clemie General is a 22 y.o. 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 Last cycle cancelled due to neuropathy.  -She had double lumpectomy and left axillary LN dissection on 10/14/2017. 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  She receives monthly Zoldex to suppress ovarian function during her chemo. I previously recommended visiting a fertility clinic to discuss her fertility concerns but she did not make it due financial concerns.  -She completed radiation therapy with Dr. Isidore Moos on 01/27/2018 -labs reviewed, CBC showed Hg 11.9. CMP pending -She is recovering well from chemotherapy and radiation.  Exam was unremarkable.  No clinical concerns for recurrence. -Menses has not returned.  I discussed that it will take one year for her menses to return. She knows that it is advised not to have children for at least a year after chemo. I previously and again today discussed seeing an OB/GYN for this. She will think about it.  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 Anxiety -I previously prescribed low dose xanax -overall better since she completed cancer treatment   4. Headaches -She had moderate headaches during her chemotherapy, improved after she completed chemo.  It is slightly more frequent lately, intermittent -All over her head, worse at night She denies feeling down or depressed.  -I advised her to exercise, eat healthy, and avoid caffeine.  -If her headaches continue to worsen, I will refer to neurology  Plan -Survivorship in 2 months -f/u in 6 months with lab  -Mammogram in 03/2019, ordered today      No problem-specific Assessment & Plan notes found for this encounter.   Orders Placed This Encounter  Procedures  . MM DIAG BREAST TOMO BILATERAL    Standing Status:   Future    Standing Expiration Date:   03/15/2019    Order Specific Question:   Reason for Exam (SYMPTOM  OR DIAGNOSIS  REQUIRED)    Answer:   screening    Order Specific Question:   Is the patient pregnant?    Answer:   No    Order Specific Question:   Preferred imaging location?    Answer:   [facility]   All questions were answered. The patient knows to call the clinic with any problems, questions or concerns. No barriers to learning was detected. I spent 20 minutes counseling  the patient face to face. The total time spent in the appointment was 25 minutes and more than 50% was on counseling and review of test results  I, Noor Dweik am acting as scribe for Dr. [name].  I have reviewed the above documentation for accuracy and completeness, and I agree with the above.      [name], MD 03/14/2018

## 2018-03-14 ENCOUNTER — Encounter: Payer: Self-pay | Admitting: Hematology

## 2018-03-14 ENCOUNTER — Inpatient Hospital Stay: Payer: Medicaid Other

## 2018-03-14 ENCOUNTER — Inpatient Hospital Stay: Payer: Medicaid Other | Attending: Nurse Practitioner | Admitting: Hematology

## 2018-03-14 VITALS — BP 111/82 | HR 77 | Temp 97.8°F | Resp 18 | Ht 64.0 in | Wt 202.1 lb

## 2018-03-14 DIAGNOSIS — F419 Anxiety disorder, unspecified: Secondary | ICD-10-CM

## 2018-03-14 DIAGNOSIS — T451X5A Adverse effect of antineoplastic and immunosuppressive drugs, initial encounter: Secondary | ICD-10-CM | POA: Diagnosis not present

## 2018-03-14 DIAGNOSIS — Z923 Personal history of irradiation: Secondary | ICD-10-CM | POA: Diagnosis not present

## 2018-03-14 DIAGNOSIS — C50812 Malignant neoplasm of overlapping sites of left female breast: Secondary | ICD-10-CM

## 2018-03-14 DIAGNOSIS — G620 Drug-induced polyneuropathy: Secondary | ICD-10-CM | POA: Diagnosis not present

## 2018-03-14 DIAGNOSIS — Z171 Estrogen receptor negative status [ER-]: Secondary | ICD-10-CM

## 2018-03-14 DIAGNOSIS — C773 Secondary and unspecified malignant neoplasm of axilla and upper limb lymph nodes: Secondary | ICD-10-CM | POA: Diagnosis not present

## 2018-03-14 DIAGNOSIS — R51 Headache: Secondary | ICD-10-CM

## 2018-03-14 LAB — CMP (CANCER CENTER ONLY)
ALBUMIN: 3.6 g/dL (ref 3.5–5.0)
ALK PHOS: 96 U/L (ref 38–126)
ALT: 33 U/L (ref 0–44)
AST: 26 U/L (ref 15–41)
Anion gap: 8 (ref 5–15)
BILIRUBIN TOTAL: 0.6 mg/dL (ref 0.3–1.2)
BUN: 9 mg/dL (ref 6–20)
CALCIUM: 9.5 mg/dL (ref 8.9–10.3)
CO2: 25 mmol/L (ref 22–32)
Chloride: 107 mmol/L (ref 98–111)
Creatinine: 0.74 mg/dL (ref 0.44–1.00)
GFR, Est AFR Am: >60 mL/min (ref >60)
GFR, Estimated: >60 mL/min (ref >60)
GLUCOSE: 98 mg/dL (ref 70–99)
Potassium: 4 mmol/L (ref 3.5–5.1)
Sodium: 140 mmol/L (ref 135–145)
TOTAL PROTEIN: 7.5 g/dL (ref 6.5–8.1)

## 2018-03-14 LAB — CBC WITH DIFFERENTIAL (CANCER CENTER ONLY)
Abs Immature Granulocytes: 0.01 10*3/uL (ref 0.00–0.07)
BASOS PCT: 1 %
Basophils Absolute: 0 10*3/uL (ref 0.0–0.1)
EOS ABS: 0.1 10*3/uL (ref 0.0–0.5)
Eosinophils Relative: 3 %
HEMATOCRIT: 35.8 % — AB (ref 36.0–46.0)
Hemoglobin: 11.9 g/dL — ABNORMAL LOW (ref 12.0–15.0)
Immature Granulocytes: 0 %
LYMPHS ABS: 1.3 10*3/uL (ref 0.7–4.0)
Lymphocytes Relative: 31 %
MCH: 30.4 pg (ref 26.0–34.0)
MCHC: 33.2 g/dL (ref 30.0–36.0)
MCV: 91.3 fL (ref 80.0–100.0)
MONOS PCT: 9 %
Monocytes Absolute: 0.4 10*3/uL (ref 0.1–1.0)
Neutro Abs: 2.4 10*3/uL (ref 1.7–7.7)
Neutrophils Relative %: 56 %
PLATELETS: 238 10*3/uL (ref 150–400)
RBC: 3.92 MIL/uL (ref 3.87–5.11)
RDW: 12.8 % (ref 11.5–15.5)
WBC Count: 4.3 10*3/uL (ref 4.0–10.5)
nRBC: 0 % (ref 0.0–0.2)

## 2018-03-15 ENCOUNTER — Other Ambulatory Visit: Payer: Self-pay

## 2018-03-15 ENCOUNTER — Ambulatory Visit
Admission: RE | Admit: 2018-03-15 | Discharge: 2018-03-15 | Disposition: A | Discharge status: Home / Self Care | Payer: Medicaid Other | Source: Ambulatory Visit | Attending: Radiation Oncology | Admitting: Radiation Oncology

## 2018-03-15 ENCOUNTER — Encounter: Payer: Self-pay | Admitting: Radiation Oncology

## 2018-03-15 ENCOUNTER — Telehealth: Payer: Self-pay

## 2018-03-15 DIAGNOSIS — C50812 Malignant neoplasm of overlapping sites of left female breast: Secondary | ICD-10-CM | POA: Diagnosis present

## 2018-03-15 DIAGNOSIS — Z79899 Other long term (current) drug therapy: Secondary | ICD-10-CM | POA: Diagnosis not present

## 2018-03-15 LAB — CANCER ANTIGEN 27.29: CA 27.29: 11.7 U/mL (ref 0.0–38.6)

## 2018-03-15 NOTE — Telephone Encounter (Signed)
Spoke with brother concerning patient newly added appointment. Per 11/25 los. Will mail a letter with a calender enclosed

## 2018-03-15 NOTE — Progress Notes (Signed)
Radiation Oncology         [phone] ________________________________  Name: Carrie Miller MRN: [id number]  Date: 03/15/2018  DOB: [birth year]  Follow-Up Visit Note  Outpatient  CC: Patient, No Pcp Per  [name], MD  Diagnosis and Prior Radiotherapy:    ICD-10-CM   1. Cancer of overlapping sites of left breast ([place]) C50.812     Stage IIIC (cT3(m), cN1, cM0), ER: negative, PR: negative, Her2: negative,Grade3(Cancer stage was possibly T4d at diagnosis) Cancer Staging Cancer of overlapping sites of left breast [facility]) Staging form: Breast, AJCC 8th Edition - Clinical stage from 04/19/2017: Stage IIIC (cT3(m), cN1, cM0, G3, ER: Negative, PR: Negative, HER2: Negative) - Signed by [name], MD on 04/28/2017 - Pathologic stage from 10/14/2017: No Stage Recommended (ypT0, pN0, cM0, GX, ER: Not Assessed, PR: Not Assessed, HER2: Not Assessed) - Signed by [name], MD on 10/24/2017  Radiation treatment dates:   12/09/2017 - 01/27/2018 Site/dose:    1. Left Breast and IM nodes / 50 Gy in 25 fractions 2. Left SCV, PAB / 50 Gy in 25 fractions 3. Left Breast Boost / 10 Gy in 5 fractions  CHIEF COMPLAINT: Here for follow-up and surveillance of left breast cancer  Narrative:  The patient returns today with her brother for routine follow-up of radiation completed 1 month ago to her left breast.  She last saw Dr. Burr Medico on 03/14/18 and will see her again in 6 months. She will meet with Survivorship on 05/18/18. She states that her skin has healed, and she is applying vitamin E lotion to the radiation site.                               ALLERGIES:  is allergic to heparin.  Meds: Current Outpatient Medications  Medication Sig Dispense Refill  . acetaminophen (TYLENOL) 325 MG tablet Take 2 tablets (650 mg total) by mouth every 4 (four) hours as needed. 50 tablet 2  . ALPRAZolam (XANAX) 0.25 MG tablet Take 1 tablet (0.25 mg total) by mouth at bedtime as needed for anxiety. 30 tablet 0  .  Cyanocobalamin (VITAMIN B-12 PO) Take 1 tablet by mouth daily.    . Multiple Vitamins-Minerals (BIOTIN PLUS/CALCIUM/VIT D3 PO) Take 1 tablet by mouth daily.    . DULoxetine (CYMBALTA) 30 MG capsule Take 1 capsule (30 mg total) by mouth daily. (Patient not taking: Reported on 03/15/2018) 30 capsule 0  . LORazepam (ATIVAN) 0.5 MG tablet Take 1 tablet (0.5 mg total) by mouth every 8 (eight) hours as needed (nausea, vomiting). (Patient not taking: Reported on 03/15/2018) 20 tablet 0  . oxyCODONE (OXY IR/ROXICODONE) 5 MG immediate release tablet Take 1 tablet (5 mg total) by mouth every 6 (six) hours as needed for severe pain. (Patient not taking: Reported on 03/15/2018) 10 tablet 0  . oxyCODONE-acetaminophen (PERCOCET/ROXICET) 5-325 MG tablet Take 1-2 tablets by mouth every 8 (eight) hours as needed for severe pain. (Patient not taking: Reported on 03/15/2018) 8 tablet 0  . senna (SENOKOT) 8.6 MG TABS tablet Take 1-2 tablets (8.6-17.2 mg total) by mouth at bedtime as needed for mild constipation or moderate constipation. (Patient not taking: Reported on 03/15/2018) 40 each 0   No current facility-administered medications for this encounter.     Physical Findings: The patient is in no acute distress. Patient is alert and oriented.  height is _0  (1.676 m) and weight is 201 lb 6 oz (91.3  kg). Her oral temperature is 97.6 F (36.4 C). Her blood pressure is 121/70 and her pulse is 77. Her respiration is 18 and oxygen saturation is 100%.     Satisfactory skin healing in radiotherapy fields. She still has some hyperpigmentation over her left breast but the skin has healed beautifully. Skin is totally intact. No sign of tumor recurrence in the left axilla or throughout the left breast. No concerning nodules over the skin.   Lab Findings: Lab Results  Component Value Date   WBC 4.3 03/14/2018   HGB 11.9 (L) 03/14/2018   HCT 35.8 (L) 03/14/2018   MCV 91.3 03/14/2018   PLT 238 03/14/2018     Radiographic Findings: No results found.  Impression/Plan: Healing well from radiotherapy to the breast tissue.  Continue skin care with topical Vitamin E Oil and / or lotion twice a day for at least 2 more months for further healing.  I encouraged her to continue with yearly breast imaging and followup with medical oncology. I will see her back on an as-needed basis. I have encouraged her to call if she has any issues or concerns in the future. I wished her the very best.   I spent 10 minutes face to face with the patient and more than 50% of that time was spent in counseling and/or coordination of care. _____________________________________   Eppie Gibson, MD  This document serves as a record of services personally performed by Eppie Gibson, MD. It was created on her behalf by [name], a trained medical scribe. The creation of this record is based on the scribe's personal observations and the provider's statements to them. This document has been checked and approved by the attending provider.

## 2018-03-23 ENCOUNTER — Encounter: Payer: Self-pay | Admitting: *Deleted

## 2018-03-25 ENCOUNTER — Telehealth: Payer: Self-pay

## 2018-03-25 ENCOUNTER — Encounter: Payer: Self-pay | Admitting: Hematology

## 2018-03-25 NOTE — Telephone Encounter (Signed)
Patient's brother calls stating the patient is in [place] and has felt a "ball" in left breast in the area of original cancer.  Wants advice on what she should do.  She no longer has Medicaid.   His [phone]

## 2018-03-25 NOTE — Telephone Encounter (Signed)
Per her brother's request sent letter from Dr. Burr Medico to the following address:  [address] [place] [address]

## 2018-04-07 NOTE — Progress Notes (Signed)
Medicaid form completed and mailed to patient address on file.

## 2018-04-08 ENCOUNTER — Telehealth: Payer: Self-pay | Admitting: *Deleted

## 2018-04-08 NOTE — Telephone Encounter (Signed)
Medical records faxed to [place], [place]; RID [id number]

## 2018-04-21 ENCOUNTER — Telehealth: Payer: Self-pay | Admitting: Hematology

## 2018-04-21 NOTE — Telephone Encounter (Signed)
Called talked with brother about cancelling appointment for 1/29 will reschedule with lacie upon return in Aprl

## 2018-05-18 ENCOUNTER — Encounter: Payer: Medicaid Other | Admitting: Adult Health

## 2018-08-12 ENCOUNTER — Telehealth: Payer: Self-pay | Admitting: Hematology

## 2018-08-12 NOTE — Telephone Encounter (Signed)
Called patient via [facility] [phone] re rescheduling January SCP visit. Spoke with brother who speaks [place]. Per brother Patient has moved to [place] and has new doctors there. May lab/fu cancelled. Message routed to [place].

## 2018-09-14 ENCOUNTER — Other Ambulatory Visit: Payer: Medicaid Other

## 2018-09-14 ENCOUNTER — Ambulatory Visit: Payer: Medicaid Other | Admitting: Hematology

## 2019-05-05 ENCOUNTER — Telehealth: Payer: Self-pay | Admitting: *Deleted

## 2019-05-05 NOTE — Telephone Encounter (Signed)
On 05-05-19 faxed medical records to [facility],

## 2020-01-09 IMAGING — NM NM BONE WHOLE BODY
2 series · 2 of 2 positions shown · non-contrast
Comparison: CT chest, abdomen, and pelvis 05/07/2017

CLINICAL DATA: Breast cancer staging.

EXAM:
NUCLEAR MEDICINE WHOLE BODY BONE SCAN
TECHNIQUE: Whole body anterior and posterior images were obtained approximately
3 hours after intravenous injection of radiopharmaceutical.
RADIOPHARMACEUTICALS:  21.9 mCi Mechnetium-33m MDP IV

[Series 1: wbr_bone_40 whole body · 2.66mm/px · 1 of 1 slices shown (1 of 2)]
[im 1/1]
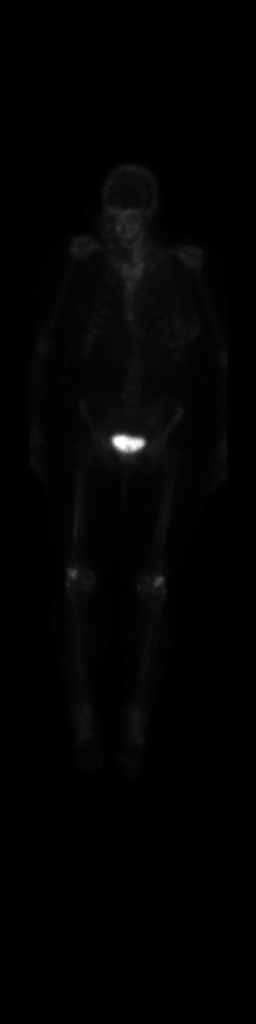

[Series 1: wbr_bone_40 whole body · 2.66mm/px · 1 of 1 slices shown (2 of 2)]
[im 1/1]
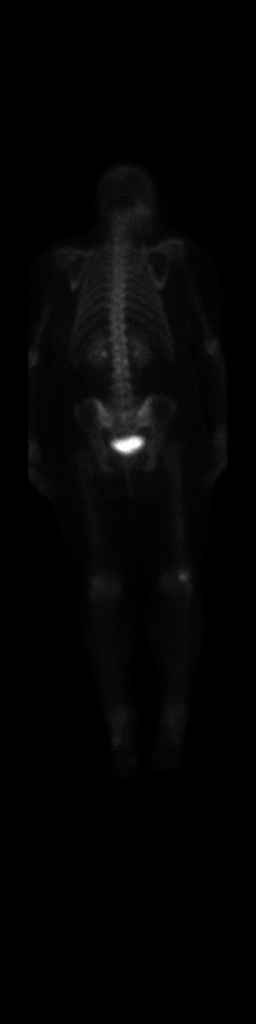

[2 of 2 positions shown; findings below may reference images not displayed]

FINDINGS: There is symmetric radiotracer uptake by the kidneys with excreted
tracer in the bladder. Abnormal tracer uptake about both knees is
likely degenerative. Uptake elsewhere throughout the skeleton is
symmetric without evidence of metastatic disease. There is low level
radiotracer uptake in the soft tissues of the left breast.
IMPRESSION: 1. No evidence of osseous metastases.
2. Abnormal uptake in the soft tissues of the left breast likely by
the patient's known breast cancer.

## 2020-01-30 ENCOUNTER — Telehealth: Payer: Self-pay | Admitting: Hematology and Oncology

## 2020-01-30 NOTE — Telephone Encounter (Signed)
A new pt appt has been scheduled for Ms. Hairdary to see Dr. Lindi Adie on 10/21 at 345pm. Appt date and time has been given to the pt's brother.

## 2020-02-07 ENCOUNTER — Telehealth: Payer: Self-pay | Admitting: Hematology and Oncology

## 2020-02-07 NOTE — Telephone Encounter (Signed)
I received a call from the pt's brother to cancel her appt w/Dr. Lindi Adie n 10/21. He will call back to reschedule.

## 2020-02-08 ENCOUNTER — Ambulatory Visit: Payer: Medicaid Other | Admitting: Hematology and Oncology

## 2021-10-02 ENCOUNTER — Telehealth: Payer: Self-pay | Admitting: Radiation Oncology

## 2021-10-02 NOTE — Telephone Encounter (Signed)
Received a call from Freeman from [facility] for a request of records for this patient. This patient is currently inpatient at their facility, possibly in need of radiation treatments. Forwarding request to [facility].

## 2022-07-08 ENCOUNTER — Other Ambulatory Visit: Payer: Self-pay | Admitting: Hematology

## 2022-07-08 DIAGNOSIS — C50812 Malignant neoplasm of overlapping sites of left female breast: Secondary | ICD-10-CM
